# Patient Record
Sex: Male | Born: 1970
Health system: Southern US, Community
[De-identification: ages and names within clinical notes are randomized; demographics above are authoritative.]

## PROBLEM LIST (undated history)

## (undated) DIAGNOSIS — Z5189 Encounter for other specified aftercare: Secondary | ICD-10-CM

## (undated) DIAGNOSIS — E278 Other specified disorders of adrenal gland: Secondary | ICD-10-CM

## (undated) DIAGNOSIS — E279 Disorder of adrenal gland, unspecified: Secondary | ICD-10-CM

## (undated) DIAGNOSIS — I2510 Atherosclerotic heart disease of native coronary artery without angina pectoris: Secondary | ICD-10-CM

## (undated) DIAGNOSIS — G4730 Sleep apnea, unspecified: Secondary | ICD-10-CM

## (undated) DIAGNOSIS — J45909 Unspecified asthma, uncomplicated: Secondary | ICD-10-CM

## (undated) DIAGNOSIS — M109 Gout, unspecified: Secondary | ICD-10-CM

## (undated) DIAGNOSIS — M1990 Unspecified osteoarthritis, unspecified site: Secondary | ICD-10-CM

## (undated) DIAGNOSIS — K219 Gastro-esophageal reflux disease without esophagitis: Secondary | ICD-10-CM

## (undated) DIAGNOSIS — I10 Essential (primary) hypertension: Secondary | ICD-10-CM

## (undated) DIAGNOSIS — M25569 Pain in unspecified knee: Secondary | ICD-10-CM

## (undated) DIAGNOSIS — E785 Hyperlipidemia, unspecified: Secondary | ICD-10-CM

## (undated) DIAGNOSIS — G8929 Other chronic pain: Secondary | ICD-10-CM

## (undated) DIAGNOSIS — M25579 Pain in unspecified ankle and joints of unspecified foot: Secondary | ICD-10-CM

## (undated) DIAGNOSIS — M779 Enthesopathy, unspecified: Secondary | ICD-10-CM

## (undated) DIAGNOSIS — F1910 Other psychoactive substance abuse, uncomplicated: Secondary | ICD-10-CM

## (undated) HISTORY — DX: Unspecified asthma, uncomplicated: J45.909

## (undated) HISTORY — DX: Other psychoactive substance abuse, uncomplicated: F19.10

## (undated) HISTORY — PX: KNEE SURGERY: SHX244

## (undated) HISTORY — DX: Atherosclerotic heart disease of native coronary artery without angina pectoris: I25.10

## (undated) HISTORY — DX: Other chronic pain: G89.29

## (undated) HISTORY — DX: Hyperlipidemia, unspecified: E78.5

## (undated) HISTORY — DX: Gastro-esophageal reflux disease without esophagitis: K21.9

## (undated) HISTORY — DX: Disorder of adrenal gland, unspecified: E27.9

## (undated) HISTORY — DX: Gout, unspecified: M10.9

## (undated) HISTORY — PX: UPPER GASTROINTESTINAL ENDOSCOPY: SHX188

## (undated) HISTORY — DX: Other specified disorders of adrenal gland: E27.8

## (undated) HISTORY — DX: Encounter for other specified aftercare: Z51.89

## (undated) HISTORY — DX: Sleep apnea, unspecified: G47.30

---

## 2001-05-12 ENCOUNTER — Encounter: Payer: Self-pay | Admitting: Emergency Medicine

## 2001-05-12 ENCOUNTER — Emergency Department (HOSPITAL_COMMUNITY): Admission: EM | Admit: 2001-05-12 | Discharge: 2001-05-12 | Payer: Self-pay | Admitting: Emergency Medicine

## 2002-08-27 ENCOUNTER — Emergency Department (HOSPITAL_COMMUNITY): Admission: EM | Admit: 2002-08-27 | Discharge: 2002-08-27 | Payer: Self-pay | Admitting: Emergency Medicine

## 2002-10-01 ENCOUNTER — Emergency Department (HOSPITAL_COMMUNITY): Admission: EM | Admit: 2002-10-01 | Discharge: 2002-10-01 | Payer: Self-pay | Admitting: *Deleted

## 2002-10-01 ENCOUNTER — Encounter: Payer: Self-pay | Admitting: Emergency Medicine

## 2002-12-01 ENCOUNTER — Emergency Department (HOSPITAL_COMMUNITY): Admission: EM | Admit: 2002-12-01 | Discharge: 2002-12-02 | Payer: Self-pay | Admitting: Emergency Medicine

## 2009-05-21 ENCOUNTER — Emergency Department (HOSPITAL_COMMUNITY): Admission: EM | Admit: 2009-05-21 | Discharge: 2009-05-21 | Payer: Self-pay | Admitting: Emergency Medicine

## 2010-12-02 ENCOUNTER — Emergency Department (HOSPITAL_COMMUNITY)
Admission: EM | Admit: 2010-12-02 | Discharge: 2010-12-02 | Disposition: A | Discharge status: Home / Self Care | Payer: Self-pay | Attending: Emergency Medicine | Admitting: Emergency Medicine

## 2010-12-02 DIAGNOSIS — T622X1A Toxic effect of other ingested (parts of) plant(s), accidental (unintentional), initial encounter: Secondary | ICD-10-CM | POA: Insufficient documentation

## 2010-12-02 DIAGNOSIS — L298 Other pruritus: Secondary | ICD-10-CM | POA: Insufficient documentation

## 2010-12-02 DIAGNOSIS — R220 Localized swelling, mass and lump, head: Secondary | ICD-10-CM | POA: Insufficient documentation

## 2010-12-02 DIAGNOSIS — M7989 Other specified soft tissue disorders: Secondary | ICD-10-CM | POA: Insufficient documentation

## 2010-12-02 DIAGNOSIS — R221 Localized swelling, mass and lump, neck: Secondary | ICD-10-CM | POA: Insufficient documentation

## 2010-12-02 DIAGNOSIS — L2989 Other pruritus: Secondary | ICD-10-CM | POA: Insufficient documentation

## 2010-12-02 DIAGNOSIS — L255 Unspecified contact dermatitis due to plants, except food: Secondary | ICD-10-CM | POA: Insufficient documentation

## 2010-12-02 DIAGNOSIS — R21 Rash and other nonspecific skin eruption: Secondary | ICD-10-CM | POA: Insufficient documentation

## 2011-08-18 ENCOUNTER — Emergency Department (HOSPITAL_COMMUNITY)
Admission: EM | Admit: 2011-08-18 | Discharge: 2011-08-18 | Disposition: A | Discharge status: Home / Self Care | Payer: Self-pay | Attending: Emergency Medicine | Admitting: Emergency Medicine

## 2011-08-18 ENCOUNTER — Encounter (HOSPITAL_COMMUNITY): Payer: Self-pay | Admitting: *Deleted

## 2011-08-18 ENCOUNTER — Emergency Department (HOSPITAL_COMMUNITY): Payer: Self-pay

## 2011-08-18 DIAGNOSIS — M7150 Other bursitis, not elsewhere classified, unspecified site: Secondary | ICD-10-CM | POA: Insufficient documentation

## 2011-08-18 DIAGNOSIS — M25551 Pain in right hip: Secondary | ICD-10-CM

## 2011-08-18 DIAGNOSIS — IMO0002 Reserved for concepts with insufficient information to code with codable children: Secondary | ICD-10-CM | POA: Insufficient documentation

## 2011-08-18 DIAGNOSIS — I10 Essential (primary) hypertension: Secondary | ICD-10-CM | POA: Insufficient documentation

## 2011-08-18 DIAGNOSIS — M25559 Pain in unspecified hip: Secondary | ICD-10-CM | POA: Insufficient documentation

## 2011-08-18 DIAGNOSIS — M719 Bursopathy, unspecified: Secondary | ICD-10-CM

## 2011-08-18 HISTORY — DX: Essential (primary) hypertension: I10

## 2011-08-18 MED ORDER — IBUPROFEN 600 MG PO TABS: 600.0000 mg | ORAL_TABLET | Freq: Four times a day (QID) | ORAL | Status: AC | PRN

## 2011-08-18 MED ORDER — OXYCODONE-ACETAMINOPHEN 5-325 MG PO TABS: 2.0000 | ORAL_TABLET | ORAL | Status: AC | PRN

## 2011-08-18 MED ORDER — OXYCODONE-ACETAMINOPHEN 5-325 MG PO TABS
2.0000 | ORAL_TABLET | Freq: Once | ORAL | Status: AC
  Administered 2011-08-18: 2 via ORAL
  Filled 2011-08-18: qty 2

## 2011-08-18 NOTE — ED Provider Notes (Signed)
History     CSN: [id number]  Arrival date & time 08/18/11  0745   First MD Initiated Contact with Patient 08/18/11 [phone]      Chief Complaint  Patient presents with  . Hip Pain    (Consider location/radiation/quality/duration/timing/severity/associated sxs/prior treatment) Patient is a 41 y.o. male presenting with hip pain. The history is provided by the patient. No language interpreter was used.  Hip Pain This is a new problem. The current episode started in the past 7 days. The problem occurs constantly. The problem has been gradually worsening. Associated symptoms include arthralgias and weakness. Pertinent negatives include no abdominal pain, fever, joint swelling, nausea, rash or vomiting. The symptoms are aggravated by walking, twisting and bending. He has tried NSAIDs and oral narcotics for the symptoms. The treatment provided mild relief.   Patient reports progressively worsening R hip pain x 3 days.  States that the pain is worse with abduction and palpation to the R lateral hip.  Unable to sleep last pm due to the pain.  Has been taking ibuprofen/ aleve and one percocet. States that the percocet did help the pain some.  Denies trauma, fever. Erythema to the hip.  Has seen Dr. Eulah Pont in the past for L knee surgery.  pmh hypertension.  No prior hip injury.  Has been lifting weights x 6 months. Having difficulty driving as well.  Pain 10/10.   Past Medical History  Diagnosis Date  . Hypertension     No past surgical history on file.  No family history on file.  History  Substance Use Topics  . Smoking status: Never Smoker   . Smokeless tobacco: Not on file  . Alcohol Use: Yes      Review of Systems  Constitutional: Negative for fever.  HENT: Negative.   Eyes: Negative.   Respiratory: Negative.   Cardiovascular: Negative.   Gastrointestinal: Negative.  Negative for nausea, vomiting and abdominal pain.  Musculoskeletal: Positive for arthralgias and gait problem.  Negative for back pain and joint swelling.       R lateral hip pain  Skin: Negative for rash.  Neurological: Positive for weakness.  Psychiatric/Behavioral: Negative.   All other systems reviewed and are negative.    Allergies  Review of patient's allergies indicates no known allergies.  Home Medications   Current Outpatient Rx  Name Route Sig Dispense Refill  . IBUPROFEN 200 MG PO TABS Oral Take 800 mg by mouth every 6 (six) hours as needed. Pain      BP 128/79  Pulse 70  Temp 98.3 F (36.8 C) (Oral)  SpO2 99%  Physical Exam  Nursing note and vitals reviewed. Constitutional: He is oriented to person, place, and time. He appears well-developed and well-nourished.  HENT:  Head: Normocephalic.  Eyes: Conjunctivae and EOM are normal. Pupils are equal, round, and reactive to light.  Neck: Normal range of motion. Neck supple.  Cardiovascular: Normal rate.   Pulmonary/Chest: Effort normal.  Abdominal: Soft.  Musculoskeletal: Normal range of motion.       R hip tenderness with palpatation and abduction.  Unable to lift leg off bed.  Neurological: He is alert and oriented to person, place, and time.  Skin: Skin is warm and dry.  Psychiatric: He has a normal mood and affect.    ED Course  Procedures (including critical care time)  Labs Reviewed - No data to display No results found.   No diagnosis found.    MDM  R hip pain with bursitis  and DDD.  Will treat with ice rest, ibuprofen and percocet.  Follow up with Dr. Eulah Pont your orthopedist or the one on call for ER.  Return if hip becomes hot to touch or fever.   Crutches and work note.          Remi Haggard, NP 08/18/11 [phone]

## 2011-08-18 NOTE — ED Notes (Signed)
Rt. Hip pain (sharp); Fri-took ibuprofen, aleve, and a neighbors percocet - percocet helped for 6 hrs. The inc. Pain with movement. Difficult to drive to work.

## 2011-08-18 NOTE — ED Provider Notes (Signed)
Medical screening examination/treatment/procedure(s) were performed by non-physician practitioner and as supervising physician I was immediately available for consultation/collaboration.   Gene Reed. Oletta Lamas, MD 08/18/11 [phone]

## 2011-08-18 NOTE — Progress Notes (Signed)
Orthopedic Tech Progress Note Patient Details:  Gene Reed Sep 24, 1970 [id number]  Ortho Devices Type of Ortho Device: Crutches Ortho Device/Splint Interventions: Adjustment   Cammer, Mickie Bail 08/18/2011, 9:30 AM

## 2012-09-09 ENCOUNTER — Emergency Department (HOSPITAL_COMMUNITY): Payer: Self-pay

## 2012-09-09 ENCOUNTER — Encounter (HOSPITAL_COMMUNITY): Payer: Self-pay | Admitting: Emergency Medicine

## 2012-09-09 ENCOUNTER — Emergency Department (HOSPITAL_COMMUNITY)
Admission: EM | Admit: 2012-09-09 | Discharge: 2012-09-09 | Disposition: A | Discharge status: Home / Self Care | Payer: Self-pay | Attending: Emergency Medicine | Admitting: Emergency Medicine

## 2012-09-09 DIAGNOSIS — M25562 Pain in left knee: Secondary | ICD-10-CM

## 2012-09-09 DIAGNOSIS — M11262 Other chondrocalcinosis, left knee: Secondary | ICD-10-CM

## 2012-09-09 DIAGNOSIS — I10 Essential (primary) hypertension: Secondary | ICD-10-CM | POA: Insufficient documentation

## 2012-09-09 DIAGNOSIS — M1120 Other chondrocalcinosis, unspecified site: Secondary | ICD-10-CM | POA: Insufficient documentation

## 2012-09-09 DIAGNOSIS — M25569 Pain in unspecified knee: Secondary | ICD-10-CM | POA: Insufficient documentation

## 2012-09-09 DIAGNOSIS — Z791 Long term (current) use of non-steroidal anti-inflammatories (NSAID): Secondary | ICD-10-CM | POA: Insufficient documentation

## 2012-09-09 MED ORDER — IBUPROFEN 800 MG PO TABS: 800.0000 mg | ORAL_TABLET | Freq: Three times a day (TID) | ORAL | Status: DC

## 2012-09-09 NOTE — ED Provider Notes (Signed)
CSN: [id number]     Arrival date & time 09/09/12  1820 History    This chart was scribed for Gene Finner, PA working with Flint Melter, MD by Quintella Reichert, ED Scribe. This patient was seen in room WTR8/WTR8 and the patient's care was started at 9:28 PM.     Chief Complaint  Patient presents with  . Knee Pain    The history is provided by the patient. No language interpreter was used.    HPI Comments: Gene Reed is a 42 y.o. male who presents to the Emergency Department complaining of gradual-onset, gradually-worsening severe left knee pain with accompanying intermittent swelling and "popping."  Pain is localized to the anterior knee and is described as a stabbing aching pain that is exacerbated by walking.  He also states it sometimes "feels like it separates."  He denies pain to the back of the knee.  Pt had surgery to the knee for ligament tears when he was 42 years old.  He notes that since then he has had some mild aches to the knee in the winter but he did not have any other problems with the knee since that time.   He has attempted to treat pain with Goody's tablets, with some relief. .He notes that he lifts heavy objects 8 hours/day at work.    Pt has no PCP.    Past Medical History  Diagnosis Date  . Hypertension     History reviewed. No pertinent past surgical history.   No family history on file.   History  Substance Use Topics  . Smoking status: Never Smoker   . Smokeless tobacco: Never Used  . Alcohol Use: Yes     Review of Systems  Musculoskeletal: Positive for arthralgias.  All other systems reviewed and are negative.      Allergies  Review of patient's allergies indicates no known allergies.  Home Medications   Current Outpatient Rx  Name  Route  Sig  Dispense  Refill  . ibuprofen (ADVIL,MOTRIN) 800 MG tablet   Oral   Take 1 tablet (800 mg total) by mouth 3 (three) times daily.   21 tablet   0     BP 128/82  Pulse 91  Temp(Src)  98.8 F (37.1 C) (Oral)  Resp 20  SpO2 98%  Physical Exam  Nursing note and vitals reviewed. Constitutional: He is oriented to person, place, and time. He appears well-developed and well-nourished.  HENT:  Head: Normocephalic and atraumatic.  Eyes: EOM are normal.  Neck: Normal range of motion.  Cardiovascular: Normal rate.   Pulmonary/Chest: Effort normal.  Musculoskeletal: Normal range of motion.       Left knee: He exhibits normal range of motion, no swelling, no ecchymosis and no erythema. Tenderness found.  2 well-healed surgical scars on left knee.  No edema, erythema or ecchymosis. Full ROM. Tenderness in medial inferior aspect and lateral aspect of left knee. Neurovascularly intact distal from left knee.  Neurological: He is alert and oriented to person, place, and time.  Skin: Skin is warm and dry.  Psychiatric: He has a normal mood and affect. His behavior is normal.    ED Course  Procedures (including critical care time)  DIAGNOSTIC STUDIES: Oxygen Saturation is 98% on room air, normal by my interpretation.    COORDINATION OF CARE: 9:35 PM- Informed pt that imaging revealed multiple bony abnormalities.  Discussed treatment plan which includes knee brace, pain medication and f/u with orthopedics with pt at bedside  and pt agreed to plan.     Labs Reviewed - No data to display   Dg Knee Complete 4 Views Left  09/09/2012   *RADIOLOGY REPORT*  Clinical Data: Knee pain  LEFT KNEE - COMPLETE 4+ VIEW  Comparison: None.  Findings: Four views of the left knee submitted.  Postsurgical changes are noted post ACL repair.  There is spurring of the proximal fibula.  Chondrocalcinosis. Small joint effusion. Significant narrowing of patellofemoral joint space.  Spurring of patella. No acute fracture or subluxation.  IMPRESSION: Postsurgical changes are noted post ACL repair.  There is spurring of the proximal fibula.  Chondrocalcinosis. Small joint effusion. Significant narrowing of  patellofemoral joint space.  Spurring of patella. No acute fracture or subluxation.   Original Report Authenticated By: Natasha Mead, M.D.    1. Left knee pain   2. Chondrocalcinosis of left knee      MDM  Pt experiencing knee pain from old knee injury.  Plain films show: spurring of proximxl fibula and chondrocalcinosis with small joint effusion.  Pt will need to f/u with orthopedics for further evaluation and tx for his knee pain.    Rx: knee sleeve, ibuprofen, f/u with [facility].   I personally performed the services described in this documentation, which was scribed in my presence. The recorded information has been reviewed and is accurate.    Gene Finner, PA-C 09/10/12 2035

## 2012-09-09 NOTE — ED Notes (Signed)
Pt reports c/o 8/10 left sided knee pain. Pt states that he had ligament surgery on that knee when he was 17 and hasn't had any difficulty since. Pt denies any recent trauma or injury. Pt states he has used a leg brace over the past few days, which has helped, however wanted to get the knee examined. Pt A/O x4, vital signs are stable, NAD.

## 2012-09-11 NOTE — ED Provider Notes (Signed)
Medical screening examination/treatment/procedure(s) were performed by non-physician practitioner and as supervising physician I was immediately available for consultation/collaboration.  Flint Melter, MD 09/11/12 [phone]

## 2012-10-15 ENCOUNTER — Emergency Department (HOSPITAL_COMMUNITY): Payer: Self-pay

## 2012-10-15 ENCOUNTER — Emergency Department (HOSPITAL_COMMUNITY)
Admission: EM | Admit: 2012-10-15 | Discharge: 2012-10-15 | Disposition: A | Discharge status: Home / Self Care | Payer: Self-pay | Attending: Emergency Medicine | Admitting: Emergency Medicine

## 2012-10-15 ENCOUNTER — Encounter (HOSPITAL_COMMUNITY): Payer: Self-pay | Admitting: Emergency Medicine

## 2012-10-15 DIAGNOSIS — S8990XA Unspecified injury of unspecified lower leg, initial encounter: Secondary | ICD-10-CM | POA: Insufficient documentation

## 2012-10-15 DIAGNOSIS — Y9302 Activity, running: Secondary | ICD-10-CM | POA: Insufficient documentation

## 2012-10-15 DIAGNOSIS — X500XXA Overexertion from strenuous movement or load, initial encounter: Secondary | ICD-10-CM | POA: Insufficient documentation

## 2012-10-15 DIAGNOSIS — I10 Essential (primary) hypertension: Secondary | ICD-10-CM | POA: Insufficient documentation

## 2012-10-15 DIAGNOSIS — Y929 Unspecified place or not applicable: Secondary | ICD-10-CM | POA: Insufficient documentation

## 2012-10-15 DIAGNOSIS — Z87891 Personal history of nicotine dependence: Secondary | ICD-10-CM | POA: Insufficient documentation

## 2012-10-15 DIAGNOSIS — S99921A Unspecified injury of right foot, initial encounter: Secondary | ICD-10-CM

## 2012-10-15 HISTORY — DX: Pain in unspecified knee: M25.569

## 2012-10-15 NOTE — ED Provider Notes (Signed)
This chart was scribed for Gene Argyle PA-C, a non-physician practitioner working with Gene Anger, DO by Gene Reed, ED Scribe. This patient was seen in room WTR6/WTR6 and the patient's care was started at 1816.   CSN: [id number]     Arrival date & time 10/15/12  1614 History   First MD Initiated Contact with Patient 10/15/12 1702     Chief Complaint  Patient presents with  . Foot Pain    "felt popping sensation" in heel last night    The history is provided by the patient.   HPI Comments: Gene Reed is a 42 y.o. male with a PMH of HTN and knee pain who presents to the Emergency Department complaining of foot pain.  His pain is constant located in the arch of the right foot. Pain occurred last night after running.  Patient states he heard a popping sensation at the onset of pain.  Describes pain as sharp. Reports associated mild swelling. Reports he works at [employer] and is always on his feet. Denies associated numbness, loss of sensation/weakness.  No associated injuries. Reports trying 6 BC powders yesterday and warm soaks with Epson salts with no relief of symptoms.  Also has applied compression with no relief.     Past Medical History  Diagnosis Date  . Hypertension   . Knee pain    Past Surgical History  Procedure Laterality Date  . Knee surgery     History reviewed. No pertinent family history. History  Substance Use Topics  . Smoking status: Former Games developer  . Smokeless tobacco: Never Used  . Alcohol Use: Yes    Review of Systems  Musculoskeletal:       Right heel pain    A complete 10 system review of systems was obtained and all systems are negative except as noted in the HPI and PMH.    Allergies  Review of patient's allergies indicates no known allergies.  Home Medications   Current Outpatient Rx  Name  Route  Sig  Dispense  Refill  . Aspirin-Acetaminophen-Caffeine (GOODY HEADACHE PO)   Oral   Take 1 packet by mouth every 4 (four)  hours as needed (pain).          BP 144/94  Pulse 97  Temp(Src) 98.2 F (36.8 C) (Oral)  Resp 18  Wt 242 lb (109.77 kg)  SpO2 99%  Filed Vitals:   10/15/12 1648  BP: 144/94  Pulse: 97  Temp: 98.2 F (36.8 C)  TempSrc: Oral  Resp: 18  Weight: 242 lb (109.77 kg)  SpO2: 99%    Physical Exam  Nursing note and vitals reviewed. Constitutional: He is oriented to person, place, and time. He appears well-developed and well-nourished. No distress.  HENT:  Head: Normocephalic and atraumatic.  Eyes: Conjunctivae and EOM are normal.  Neck: Neck supple. No tracheal deviation present.  Cardiovascular: Normal rate, regular rhythm and normal heart sounds.  Exam reveals no gallop and no friction rub.   No murmur heard. Pulmonary/Chest: Effort normal and breath sounds normal. No respiratory distress. He has no wheezes. He has no rales.  Abdominal: Soft. He exhibits no distension. There is no tenderness.  Musculoskeletal: Normal range of motion. He exhibits edema and tenderness.  Medial arch swelling of right foot.  Tenderness to palpation to the arch of the right foot.    Neurological: He is alert and oriented to person, place, and time.  Skin: Skin is warm and dry.  No ecchymosis, erythema, or  lacerations to right foot.   Psychiatric: He has a normal mood and affect. His behavior is normal.    ED Course  Procedures (including critical care time) Medications - No data to display  Labs Review Labs Reviewed - No data to display Imaging Review Dg Foot Complete Right  10/15/2012   *RADIOLOGY REPORT*  Clinical Data: Right foot pain following injury.  RIGHT FOOT COMPLETE - 3+ VIEW  Comparison: None.  Findings: No acute fracture or dislocation is noted.  No gross soft tissue abnormality is seen.  IMPRESSION: No acute abnormality noted.   Original Report Authenticated By: Alcide Clever, M.D.     DG Foot Complete Right (Final result)  Result time: 10/15/12 17:49:19    Final result by Rad  Results In Interface (10/15/12 17:49:19)    Narrative:   *RADIOLOGY REPORT*  Clinical Data: Right foot pain following injury.  RIGHT FOOT COMPLETE - 3+ VIEW  Comparison: None.  Findings: No acute fracture or dislocation is noted. No gross soft tissue abnormality is seen.  IMPRESSION: No acute abnormality noted.   Original Report Authenticated By: Alcide Clever, M.D.    MDM   1. Right foot injury, initial encounter     Gene Reed is a 42 y.o. male with a PMH of HTN and knee pain who presents to the Emergency Department complaining of foot pain.  X-rays of right foot ordered to further evaluate.       Etiology of right foot pain unclear. Soft tissue injuries cannot be excluded. No fx or dislocation on x-rays. ACE wrap applied.  Crutches given and patient instructed to be non-weight bearing. Patient given orthopedic follow-up and instructed to make an appt as soon as possible.  Patient encouraged to ice, elevate, and rest.  Patient was instructed to return to the ED if they experience any redness, fever, cyanosis, or other concerns.  Patient was in agreement with discharge and plan.     Final impressions: 1. Right foot injury     [name]    I personally performed the services described in this documentation, which was scribed in my presence. The recorded information has been reviewed and is accurate.    Jillyn Ledger, PA-C 10/16/12 1658

## 2012-10-15 NOTE — ED Notes (Signed)
Pt was running last night and felt "popping sensation" in r/heel

## 2012-10-17 NOTE — ED Provider Notes (Signed)
Medical screening examination/treatment/procedure(s) were performed by non-physician practitioner and as supervising physician I was immediately available for consultation/collaboration.   Laray Anger, DO 10/17/12 1457

## 2013-09-23 ENCOUNTER — Encounter (HOSPITAL_COMMUNITY): Payer: Self-pay | Admitting: Emergency Medicine

## 2013-09-23 ENCOUNTER — Emergency Department (HOSPITAL_COMMUNITY): Payer: Self-pay

## 2013-09-23 ENCOUNTER — Emergency Department (HOSPITAL_COMMUNITY)
Admission: EM | Admit: 2013-09-23 | Discharge: 2013-09-23 | Disposition: A | Discharge status: Home / Self Care | Payer: Self-pay | Attending: Emergency Medicine | Admitting: Emergency Medicine

## 2013-09-23 DIAGNOSIS — M19071 Primary osteoarthritis, right ankle and foot: Secondary | ICD-10-CM

## 2013-09-23 DIAGNOSIS — M19079 Primary osteoarthritis, unspecified ankle and foot: Secondary | ICD-10-CM | POA: Insufficient documentation

## 2013-09-23 DIAGNOSIS — M25579 Pain in unspecified ankle and joints of unspecified foot: Secondary | ICD-10-CM | POA: Insufficient documentation

## 2013-09-23 DIAGNOSIS — I10 Essential (primary) hypertension: Secondary | ICD-10-CM | POA: Insufficient documentation

## 2013-09-23 DIAGNOSIS — Z87891 Personal history of nicotine dependence: Secondary | ICD-10-CM | POA: Insufficient documentation

## 2013-09-23 DIAGNOSIS — G8929 Other chronic pain: Secondary | ICD-10-CM | POA: Insufficient documentation

## 2013-09-23 DIAGNOSIS — Z87828 Personal history of other (healed) physical injury and trauma: Secondary | ICD-10-CM | POA: Insufficient documentation

## 2013-09-23 DIAGNOSIS — M25571 Pain in right ankle and joints of right foot: Secondary | ICD-10-CM

## 2013-09-23 MED ORDER — OXYCODONE-ACETAMINOPHEN 5-325 MG PO TABS: 1.0000 | ORAL_TABLET | Freq: Four times a day (QID) | ORAL | Status: DC | PRN

## 2013-09-23 MED ORDER — NAPROXEN 500 MG PO TABS: 500.0000 mg | ORAL_TABLET | Freq: Two times a day (BID) | ORAL | Status: DC | PRN

## 2013-09-23 NOTE — ED Notes (Signed)
Pt presents with c/o right ankle pain. Pt reports that the pain started three weeks and he denies any injury that he knows of. Pt ambulatory to triage.

## 2013-09-23 NOTE — Discharge Instructions (Signed)
Wear ankle brace for at least 2 weeks for stabilization of ankle. Use crutches as needed for comfort. Use heat and elevate ankle throughout the day. Alternate between naprosyn and percocet for pain relief. Do not drive or operate machinery with pain medication use. Call orthopedic follow up today or tomorrow to schedule followup appointment for recheck of ongoing ankle pain and discuss further management of your degenerative ankle condition. Return to the ER for any changes or worsening symptoms.   Ankle Pain Ankle pain is a common symptom. The bones, cartilage, tendons, and muscles of the ankle joint perform a lot of work each day. The ankle joint holds your body weight and allows you to move around. Ankle pain can occur on either side or back of 1 or both ankles. Ankle pain may be sharp and burning or dull and aching. There may be tenderness, stiffness, redness, or warmth around the ankle. The pain occurs more often when a person walks or puts pressure on the ankle. CAUSES  There are many reasons ankle pain can develop. It is important to work with your caregiver to identify the cause since many conditions can impact the bones, cartilage, muscles, and tendons. Causes for ankle pain include:  Injury, including a break (fracture), sprain, or strain often due to a fall, sports, or a high-impact activity.  Swelling (inflammation) of a tendon (tendonitis).  Achilles tendon rupture.  Ankle instability after repeated sprains and strains.  Poor foot alignment.  Pressure on a nerve (tarsal tunnel syndrome).  Arthritis in the ankle or the lining of the ankle.  Crystal formation in the ankle (gout or pseudogout). DIAGNOSIS  A diagnosis is based on your medical history, your symptoms, results of your physical exam, and results of diagnostic tests. Diagnostic tests may include X-ray exams or a computerized magnetic scan (magnetic resonance imaging, MRI). TREATMENT  Treatment will depend on the cause  of your ankle pain and may include:  Keeping pressure off the ankle and limiting activities.  Using crutches or other walking support (a cane or brace).  Using rest, ice, compression, and elevation.  Participating in physical therapy or home exercises.  Wearing shoe inserts or special shoes.  Losing weight.  Taking medications to reduce pain or swelling or receiving an injection.  Undergoing surgery. HOME CARE INSTRUCTIONS   Only take over-the-counter or prescription medicines for pain, discomfort, or fever as directed by your caregiver.  Put ice on the injured area.  Put ice in a plastic bag.  Place a towel between your skin and the bag.  Leave the ice on for 15-20 minutes at a time, 03-04 times a day.  Keep your leg raised (elevated) when possible to lessen swelling.  Avoid activities that cause ankle pain.  Follow specific exercises as directed by your caregiver.  Record how often you have ankle pain, the location of the pain, and what it feels like. This information may be helpful to you and your caregiver.  Ask your caregiver about returning to work or sports and whether you should drive.  Follow up with your caregiver for further examination, therapy, or testing as directed. SEEK MEDICAL CARE IF:   Pain or swelling continues or worsens beyond 1 week.  You have an oral temperature above 102 F (38.9 C).  You are feeling unwell or have chills.  You are having an increasingly difficult time with walking.  You have loss of sensation or other new symptoms.  You have questions or concerns. MAKE SURE YOU:  Understand these instructions.  Will watch your condition.  Will get help right away if you are not doing well or get worse. Document Released: 07/11/2009 Document Revised: 04/15/2011 Document Reviewed: 07/11/2009 [facility] Patient Information 2015 [employer], [place]. This information is not intended to replace advice given to you by your health care  provider. Make sure you discuss any questions you have with your health care provider.  Arthritis, Nonspecific Arthritis is inflammation of a joint. This usually means pain, redness, warmth or swelling are present. One or more joints may be involved. There are a number of types of arthritis. Your caregiver may not be able to tell what type of arthritis you have right away. CAUSES  The most common cause of arthritis is the wear and tear on the joint (osteoarthritis). This causes damage to the cartilage, which can break down over time. The knees, hips, back and neck are most often affected by this type of arthritis. Other types of arthritis and common causes of joint pain include:  Sprains and other injuries near the joint. Sometimes minor sprains and injuries cause pain and swelling that develop hours later.  Rheumatoid arthritis. This affects hands, feet and knees. It usually affects both sides of your body at the same time. It is often associated with chronic ailments, fever, weight loss and general weakness.  Crystal arthritis. Gout and pseudo gout can cause occasional acute severe pain, redness and swelling in the foot, ankle, or knee.  Infectious arthritis. Bacteria can get into a joint through a break in overlying skin. This can cause infection of the joint. Bacteria and viruses can also spread through the blood and affect your joints.  Drug, infectious and allergy reactions. Sometimes joints can become mildly painful and slightly swollen with these types of illnesses. SYMPTOMS   Pain is the main symptom.  Your joint or joints can also be red, swollen and warm or hot to the touch.  You may have a fever with certain types of arthritis, or even feel overall ill.  The joint with arthritis will hurt with movement. Stiffness is present with some types of arthritis. DIAGNOSIS  Your caregiver will suspect arthritis based on your description of your symptoms and on your exam. Testing may be  needed to find the type of arthritis:  Blood and sometimes urine tests.  X-ray tests and sometimes CT or MRI scans.  Removal of fluid from the joint (arthrocentesis) is done to check for bacteria, crystals or other causes. Your caregiver (or a specialist) will numb the area over the joint with a local anesthetic, and use a needle to remove joint fluid for examination. This procedure is only minimally uncomfortable.  Even with these tests, your caregiver may not be able to tell what kind of arthritis you have. Consultation with a specialist (rheumatologist) may be helpful. TREATMENT  Your caregiver will discuss with you treatment specific to your type of arthritis. If the specific type cannot be determined, then the following general recommendations may apply. Treatment of severe joint pain includes:  Rest.  Elevation.  Anti-inflammatory medication (for example, ibuprofen) may be prescribed. Avoiding activities that cause increased pain.  Only take over-the-counter or prescription medicines for pain and discomfort as recommended by your caregiver.  Cold packs over an inflamed joint may be used for 10 to 15 minutes every hour. Hot packs sometimes feel better, but do not use overnight. Do not use hot packs if you are diabetic without your caregiver's permission.  A cortisone shot into arthritic  joints may help reduce pain and swelling.  Any acute arthritis that gets worse over the next 1 to 2 days needs to be looked at to be sure there is no joint infection. Long-term arthritis treatment involves modifying activities and lifestyle to reduce joint stress jarring. This can include weight loss. Also, exercise is needed to nourish the joint cartilage and remove waste. This helps keep the muscles around the joint strong. HOME CARE INSTRUCTIONS   Do not take aspirin to relieve pain if gout is suspected. This elevates uric acid levels.  Only take over-the-counter or prescription medicines for  pain, discomfort or fever as directed by your caregiver.  Rest the joint as much as possible.  If your joint is swollen, keep it elevated.  Use crutches if the painful joint is in your leg.  Drinking plenty of fluids may help for certain types of arthritis.  Follow your caregiver's dietary instructions.  Try low-impact exercise such as:  Swimming.  Water aerobics.  Biking.  Walking.  Morning stiffness is often relieved by a warm shower.  Put your joints through regular range-of-motion. SEEK MEDICAL CARE IF:   You do not feel better in 24 hours or are getting worse.  You have side effects to medications, or are not getting better with treatment. SEEK IMMEDIATE MEDICAL CARE IF:   You have a fever.  You develop severe joint pain, swelling or redness.  Many joints are involved and become painful and swollen.  There is severe back pain and/or leg weakness.  You have loss of bowel or bladder control. Document Released: 02/29/2004 Document Revised: 04/15/2011 Document Reviewed: 03/16/2008 [facility] Patient Information 2015 [place], [place]. This information is not intended to replace advice given to you by your health care provider. Make sure you discuss any questions you have with your health care provider.  Heat Therapy Heat therapy can help ease sore, stiff, injured, and tight muscles and joints. Heat relaxes your muscles, which may help ease your pain.  RISKS AND COMPLICATIONS If you have any of the following conditions, do not use heat therapy unless your health care provider has approved:  Poor circulation.  Healing wounds or scarred skin in the area being treated.  Diabetes, heart disease, or high blood pressure.  Not being able to feel (numbness) the area being treated.  Unusual swelling of the area being treated.  Active infections.  Blood clots.  Cancer.  Inability to communicate pain. This may include young children and people who have problems with  their brain function (dementia).  Pregnancy. Heat therapy should only be used on old, pre-existing, or long-lasting (chronic) injuries. Do not use heat therapy on new injuries unless directed by your health care provider. HOW TO USE HEAT THERAPY There are several different kinds of heat therapy, including:  Moist heat pack.  Warm water bath.  Hot water bottle.  Electric heating pad.  Heated gel pack.  Heated wrap.  Electric heating pad. Use the heat therapy method suggested by your health care provider. Follow your health care provider's instructions on when and how to use heat therapy. GENERAL HEAT THERAPY RECOMMENDATIONS  Do not sleep while using heat therapy. Only use heat therapy while you are awake.  Your skin may turn pink while using heat therapy. Do not use heat therapy if your skin turns red.  Do not use heat therapy if you have new pain.  High heat or long exposure to heat can cause burns. Be careful when using heat therapy to avoid  burning your skin.  Do not use heat therapy on areas of your skin that are already irritated, such as with a rash or sunburn. SEEK MEDICAL CARE IF:  You have blisters, redness, swelling, or numbness.  You have new pain.  Your pain is worse. MAKE SURE YOU:  Understand these instructions.  Will watch your condition.  Will get help right away if you are not doing well or get worse. Document Released: 04/15/2011 Document Revised: 06/07/2013 Document Reviewed: 03/16/2013 [facility] Patient Information 2015 [place], [place]. This information is not intended to replace advice given to you by your health care provider. Make sure you discuss any questions you have with your health care provider.

## 2013-09-23 NOTE — ED Provider Notes (Signed)
Medical screening examination/treatment/procedure(s) were performed by non-physician practitioner and as supervising physician I was immediately available for consultation/collaboration.   Toy BakerAnthony T Richardine Peppers, MD 09/23/13 [phone]

## 2013-09-23 NOTE — ED Provider Notes (Signed)
CSN: [id number]     Arrival date & time 09/23/13  [id number] History   First MD Initiated Contact with Patient 09/23/13 2005     Chief Complaint  Patient presents with  . Ankle Pain     (Consider location/radiation/quality/duration/timing/severity/associated sxs/prior Treatment) HPI Comments: Gene Reed is a 43 y.o. 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 States the pain is 9/10, constant, sharp, located over medial aspect of ankle, nonradiating, worse with walking especially on uneven surfaces, and with no known alleviating factors. States he sprained this ankle last September, no known injury prior to this pain reoccurring. States the pain has mildly improved over the last 3 weeks but has not gone fully away. States he's ambulatory but he has to "turn the foot out" to walk. Associated symptom is mild swelling, stiffness, and some loss of ROM with dorsiflexion and inversion. Denies fevers, chills, hx of gout, erythema or warmth, conjunctivitis, CP, SOB, cough, dysuria, penile discharge, paresthesias or weakness.   Patient is a 43 y.o. male presenting with ankle pain. The history is provided by the patient. No language interpreter was used.  Ankle Pain Location:  Ankle Time since incident:  3 weeks Injury: no   Ankle location:  R ankle Pain details:    Quality:  Sharp   Radiates to:  Does not radiate   Severity:  Moderate   Onset quality:  Gradual   Duration:  3 weeks   Timing:  Constant   Progression:  Partially resolved Chronicity:  Recurrent Dislocation: no   Prior injury to area:  Yes (sprained in Sept 2014) Relieved by:  None tried Worsened by:  Activity Ineffective treatments:  None tried Associated symptoms: decreased ROM, stiffness and swelling   Associated symptoms: no back pain, no fever, no muscle weakness, no neck pain, no numbness and no tingling     Past Medical History   Diagnosis Date  . Hypertension   . Knee pain    Past Surgical History  Procedure Laterality Date  . Knee surgery     No family history on file. History  Substance Use Topics  . Smoking status: Former Games developermoker  . Smokeless tobacco: Never Used  . Alcohol Use: Yes     Comment: daily     Review of Systems  Constitutional: Negative for fever and chills.  Eyes: Negative for pain and redness.  Respiratory: Negative for shortness of breath.   Cardiovascular: Negative for chest pain and leg swelling.  Gastrointestinal: Negative for nausea, vomiting and abdominal pain.  Musculoskeletal: Positive for arthralgias (R ankle), joint swelling (minimal R ankle) and stiffness. Negative for back pain, gait problem and neck pain.  Skin: Negative for color change and rash.  Neurological: Negative for dizziness, weakness and numbness.  Hematological: Does not bruise/bleed easily.  10 Systems reviewed and are negative for acute change except as noted in the HPI.     Allergies  Review of patient's allergies indicates no known allergies.  Home Medications   Prior to Admission medications   Medication Sig Start Date End Date Taking? Authorizing Provider  ibuprofen (ADVIL,MOTRIN) 200 MG tablet Take 600 mg by mouth every 6 (six) hours as needed for moderate pain.   Yes Historical Provider, MD  naproxen (NAPROSYN) 500 MG tablet Take 1 tablet (500 mg total) by mouth 2 (two) times daily as needed for mild pain, moderate pain  or headache (TAKE WITH MEALS.). 09/23/13   Zoelle Markus Strupp Camprubi-Soms, PA-C  oxyCODONE-acetaminophen (PERCOCET) 5-325 MG per tablet Take 1-2 tablets by mouth every 6 (six) hours as needed for severe pain. 09/23/13   Daltin Crist Strupp Camprubi-Soms, PA-C   BP 140/89  Pulse 113  Temp(Src) 98.9 F (37.2 C) (Oral)  Resp 24  SpO2 96% Physical Exam  Nursing note and vitals reviewed. Constitutional: He is oriented to person, place, and time. Vital signs are normal. He appears  well-developed and well-nourished. No distress.  HENT:  Head: Normocephalic and atraumatic.  Mouth/Throat: Mucous membranes are normal.  Eyes: Conjunctivae and EOM are normal. Right eye exhibits no discharge. Left eye exhibits no discharge.  Neck: Normal range of motion. Neck supple.  Cardiovascular: Normal rate and intact distal pulses.   Distal pulses intact in all extremities, cap refill brisk in all digits  Pulmonary/Chest: Effort normal. No respiratory distress.  Abdominal: Normal appearance. He exhibits no distension.  Musculoskeletal:       Right ankle: He exhibits decreased range of motion (2/2 pain) and swelling. He exhibits no ecchymosis, no deformity and normal pulse. Tenderness (medial ligaments and medial malleolus). Medial malleolus tenderness found. Achilles tendon normal.  R ankle with dec ROM secondary to pain especially with inversion, and dorsiflexion/plantarflexion. Strength with these movements decreased secondary to pain. TTP over medial ligaments and medial malleolus, with mild swelling of soft tissues but no joint effusion palpated. No ecchymosis, deformity, or achilles tendon injury/pain. No erythema or warmth to the area. Neg subtalar tilt test, neg anterior and posterior drawer test  Neurological: He is alert and oriented to person, place, and time. No sensory deficit.  Sensation grossly intact in all extremities  Skin: Skin is warm, dry and intact. No rash noted. No erythema.  Psychiatric: He has a normal mood and affect.    ED Course  Procedures (including critical care time) Labs Review Labs Reviewed - No data to display  Imaging Review Dg Ankle Complete Right  09/23/2013   CLINICAL DATA:  Injured right ankle.  EXAM: RIGHT ANKLE - COMPLETE 3+ VIEW  COMPARISON:  None.  FINDINGS: The ankle mortise is maintained. No acute ankle fracture. Mild degenerative changes, likely remote posttraumatic change. No definite joint effusion. No osteochondral abnormality. The  visualized mid and hindfoot bony structures are intact.  IMPRESSION: No acute bony findings.  Degenerative changes, likely posttraumatic.   Electronically Signed   By: Loralie Champagne M.D.   On: 09/23/2013 20:33     EKG Interpretation None      MDM   Final diagnoses:  Chronic ankle pain, right  Osteoarthritis of right ankle, unspecified osteoarthritis type    43y/o male with R ankle pain, xrays negative for acute change. Likely degenerative changes with acute pain. Will place in air splint with crutches for assistance with weight bearing. Will give naprosyn and percocet for pain, discussed heat therapy and f/up with ortho to discuss ongoing care. I explained the diagnosis and have given explicit precautions to return to the ER including for any other new or worsening symptoms. The patient understands and accepts the medical plan as it's been dictated and I have answered their questions. Discharge instructions concerning home care and prescriptions have been given. The patient is STABLE and is discharged to home in good condition.   BP 140/89  Pulse 113  Temp(Src) 98.9 F (37.2 C) (Oral)  Resp 24  SpO2 96%   Meds ordered this encounter  Medications  . oxyCODONE-acetaminophen (PERCOCET) 5-325  MG per tablet    Sig: Take 1-2 tablets by mouth every 6 (six) hours as needed for severe pain.    Dispense:  6 tablet    Refill:  0    Order Specific Question:  Supervising Provider    Answer:  Eber Hong D [[id number]]  . naproxen (NAPROSYN) 500 MG tablet    Sig: Take 1 tablet (500 mg total) by mouth 2 (two) times daily as needed for mild pain, moderate pain or headache (TAKE WITH MEALS.).    Dispense:  20 tablet    Refill:  0    Order Specific Question:  Supervising Provider    Answer:  Vida Roller [address] Camprubi-Soms, PA-C 09/23/13 [id number]

## 2013-10-11 ENCOUNTER — Emergency Department (HOSPITAL_COMMUNITY): Payer: Self-pay

## 2013-10-11 ENCOUNTER — Encounter (HOSPITAL_COMMUNITY): Payer: Self-pay | Admitting: Emergency Medicine

## 2013-10-11 ENCOUNTER — Emergency Department (HOSPITAL_COMMUNITY)
Admission: EM | Admit: 2013-10-11 | Discharge: 2013-10-11 | Disposition: A | Discharge status: Home / Self Care | Payer: Self-pay | Attending: Emergency Medicine | Admitting: Emergency Medicine

## 2013-10-11 DIAGNOSIS — I10 Essential (primary) hypertension: Secondary | ICD-10-CM | POA: Insufficient documentation

## 2013-10-11 DIAGNOSIS — Z87891 Personal history of nicotine dependence: Secondary | ICD-10-CM | POA: Insufficient documentation

## 2013-10-11 DIAGNOSIS — M25572 Pain in left ankle and joints of left foot: Secondary | ICD-10-CM

## 2013-10-11 DIAGNOSIS — M25579 Pain in unspecified ankle and joints of unspecified foot: Secondary | ICD-10-CM | POA: Insufficient documentation

## 2013-10-11 MED ORDER — IBUPROFEN 800 MG PO TABS: 800.0000 mg | ORAL_TABLET | Freq: Three times a day (TID) | ORAL | Status: DC | PRN

## 2013-10-11 MED ORDER — HYDROCODONE-ACETAMINOPHEN 5-325 MG PO TABS: 1.0000 | ORAL_TABLET | Freq: Four times a day (QID) | ORAL | Status: DC | PRN

## 2013-10-11 NOTE — Discharge Instructions (Signed)
Ice and elevate the ankle. Follow up with the orthopedist

## 2013-10-11 NOTE — Progress Notes (Signed)
  CARE MANAGEMENT ED NOTE 10/11/2013  Patient:  Gene Reed, Gene Reed   Account Number:  [id number]  Date Initiated:  10/11/2013  Documentation initiated by:  Edd Arbour  Subjective/Objective Assessment:   43 yr old self pay [place] pt c/o right ankle pain     Subjective/Objective Assessment Detail:   was in [facility] ED 3 weeks therefore with 2nd [facility] ED visit in 6months for  ankle pain  Pt agreed to [facility] referral     Action/Plan:   ED CM spoke with pt to offer self pay resources see not e below Referral completed - face sheet provided   Action/Plan Detail:   Anticipated DC Date:  10/11/2013     Status Recommendation to Physician:   Result of Recommendation:    Other ED Services  Consult Working Plan    DC Planning Services  Other  PCP issues  Outpatient Services - Pt will follow up  [facility] / [facility] (established/new)    Choice offered to / List presented to:            Status of service:  Completed, signed off  ED Comments:   ED Comments Detail:  CM spoke with pt who confirms self pay [facility] resident with no pcp. CM discussed and provided written information for self pay pcps, importance of pcp for f/u care, www.needymeds.org, discounted pharmacies and other [employer] such as [employer], [employer], affordable care act,  financial assistance, DSS and  health department Reviewed resources for [employer] self pay pcps like [facility] Kussmaul, family medicine at [address], [facility] family practice, general medical clinics, [facility] urgent care plus others, medication resources, [facility] out patient pharmacies and housing Pt voiced understanding and appreciation of resources provided  Provided [facility] contact information

## 2013-10-11 NOTE — ED Notes (Addendum)
Pt aware not to take any tylenol and not to drink alcohol/drive/operate heavy machinery with prescribed medications. Emphasized importance of following up with orthopedic MD and to have BP rechecked. avs in hand.

## 2013-10-11 NOTE — ED Notes (Signed)
Pt reports right ankle pain x 3 weeks and was seen here for the same. Pt has been unable to get an appointment with Dr Despina Hick and is having worse pain and increase difficulty ambulating. Pt seeking a recheck of his ankle.

## 2013-10-11 NOTE — ED Provider Notes (Signed)
CSN: [id number]     Arrival date & time 10/11/13  1326 History  This chart was scribed for non-physician practitioner, Ebbie Ridge, PA-C working with Rolland Porter, MD by Greggory Stallion, ED scribe. This patient was seen in room WTR9/WTR9 and the patient's care was started at 2:16 PM.   Chief Complaint  Patient presents with  . Ankle Pain   The history is provided by the patient. No language interpreter was used.   HPI Comments: Gene Reed is a 43 y.o. male who presents to the Emergency Department complaining of gradual onset throbbing right ankle pain with associated swelling that started 3 weeks ago. Bearing weight worsens the pain. Pt has been using an air cast with some relief. He was seen for the same on 09/23/13 and discharged home with percocet, naproxen and orthopedic follow up. Pt states he was unable to get an appointment with Dr. Despina Hick. States he has past injury to ankle one year ago where he heard a pop and has had intermittent pain since. He is unsure of what the official diagnosis was.   Past Medical History  Diagnosis Date  . Hypertension   . Knee pain    Past Surgical History  Procedure Laterality Date  . Knee surgery     No family history on file. History  Substance Use Topics  . Smoking status: Former Games developer  . Smokeless tobacco: Never Used  . Alcohol Use: Yes     Comment: daily     Review of Systems All other systems negative except as documented in the HPI. All pertinent positives and negatives as reviewed in the HPI.  Allergies  Review of patient's allergies indicates no known allergies.  Home Medications   Prior to Admission medications   Medication Sig Start Date End Date Taking? Authorizing Provider  HYDROcodone-acetaminophen (NORCO/VICODIN) 5-325 MG per tablet Take 1 tablet by mouth every 6 (six) hours as needed for moderate pain. 10/11/13   Jamesetta Orleans Lawyer, PA-C  ibuprofen (ADVIL,MOTRIN) 200 MG tablet Take 600 mg by mouth every 6 (six) hours as  needed for moderate pain.    Historical Provider, MD  ibuprofen (ADVIL,MOTRIN) 800 MG tablet Take 1 tablet (800 mg total) by mouth every 8 (eight) hours as needed. 10/11/13   Jamesetta Orleans Lawyer, PA-C  naproxen (NAPROSYN) 500 MG tablet Take 1 tablet (500 mg total) by mouth 2 (two) times daily as needed for mild pain, moderate pain or headache (TAKE WITH MEALS.). 09/23/13   Mercedes Strupp Camprubi-Soms, PA-C  oxyCODONE-acetaminophen (PERCOCET) 5-325 MG per tablet Take 1-2 tablets by mouth every 6 (six) hours as needed for severe pain. 09/23/13   Mercedes Strupp Camprubi-Soms, PA-C   BP 170/105  Pulse 95  Temp(Src) 98.1 F (36.7 C) (Oral)  Resp 16  SpO2 99%  Physical Exam  Nursing note and vitals reviewed. Constitutional: He is oriented to person, place, and time. He appears well-developed and well-nourished. No distress.  HENT:  Head: Normocephalic and atraumatic.  Eyes: Conjunctivae and EOM are normal.  Neck: Neck supple.  Cardiovascular: Normal rate.   Pulmonary/Chest: Effort normal. No respiratory distress.  Musculoskeletal: Normal range of motion.  Tenderness below right medial and lateral malleoli. Pain with movement. No swelling noted.   Neurological: He is alert and oriented to person, place, and time.  Skin: Skin is warm and dry.  Psychiatric: He has a normal mood and affect. His behavior is normal.    ED Course  Procedures (including critical care time)  DIAGNOSTIC STUDIES:  Oxygen Saturation is 99% on RA, normal by my interpretation.    COORDINATION OF CARE: 2:19 PM-Discussed treatment plan which includes xray with pt at bedside and pt agreed to plan.   Labs Review Labs Reviewed - No data to display  Imaging Review No results found.   EKG Interpretation None      MDM   Final diagnoses:  Ankle pain, left     I personally performed the services described in this documentation, which was scribed in my presence. The recorded information has been reviewed and  is accurate.  Rolland Porter, MD 10/16/13 [phone]

## 2013-11-06 ENCOUNTER — Encounter (HOSPITAL_COMMUNITY): Payer: Self-pay | Admitting: Emergency Medicine

## 2013-11-06 ENCOUNTER — Emergency Department (HOSPITAL_COMMUNITY): Payer: Self-pay

## 2013-11-06 ENCOUNTER — Emergency Department (HOSPITAL_COMMUNITY)
Admission: EM | Admit: 2013-11-06 | Discharge: 2013-11-06 | Disposition: A | Discharge status: Home / Self Care | Payer: Self-pay | Attending: Emergency Medicine | Admitting: Emergency Medicine

## 2013-11-06 DIAGNOSIS — M25572 Pain in left ankle and joints of left foot: Secondary | ICD-10-CM | POA: Insufficient documentation

## 2013-11-06 DIAGNOSIS — Z87891 Personal history of nicotine dependence: Secondary | ICD-10-CM | POA: Insufficient documentation

## 2013-11-06 DIAGNOSIS — M1990 Unspecified osteoarthritis, unspecified site: Secondary | ICD-10-CM | POA: Insufficient documentation

## 2013-11-06 DIAGNOSIS — I10 Essential (primary) hypertension: Secondary | ICD-10-CM | POA: Insufficient documentation

## 2013-11-06 HISTORY — DX: Pain in unspecified ankle and joints of unspecified foot: M25.579

## 2013-11-06 HISTORY — DX: Unspecified osteoarthritis, unspecified site: M19.90

## 2013-11-06 HISTORY — DX: Enthesopathy, unspecified: M77.9

## 2013-11-06 MED ORDER — TRAMADOL HCL 50 MG PO TABS: 50.0000 mg | ORAL_TABLET | Freq: Four times a day (QID) | ORAL | Status: DC | PRN

## 2013-11-06 MED ORDER — MELOXICAM 15 MG PO TABS: 15.0000 mg | ORAL_TABLET | Freq: Every day | ORAL | Status: DC

## 2013-11-06 NOTE — ED Notes (Addendum)
Pt c/o 24 hour hx of pain in l/ankle. Denies trauma.  Recent dx of "bone spurs" of r/foot. Pt is using a crutch to use to help with the limp. Pt has not followed up by locating a PCP. [facility] Victorino DikeHewitt

## 2013-11-06 NOTE — ED Provider Notes (Signed)
Medical screening examination/treatment/procedure(s) were performed by non-physician practitioner and as supervising physician I was immediately available for consultation/collaboration.   EKG Interpretation None        Rolan BuccoMelanie Billye Pickerel, MD 11/06/13 1606

## 2013-11-06 NOTE — ED Provider Notes (Signed)
CSN: [id number]     Arrival date & time 11/06/13  1440 History  This chart was scribed for a non-physician practitioner, Renne Crigler, PA-C, working with Rolan Bucco, MD by Julian Hy, ED Scribe. The patient was seen in WTR5/WTR5. The patient's care was started at 3:08 PM.   Chief Complaint  Patient presents with  . Ankle Pain    l/ankle pain. Denies trauma   The history is provided by the patient. No language interpreter was used.   HPI Comments: Gene Reed is a 43 y.o. male who presents to the Emergency Department complaining of new, moderate, gradually worsening, constant left ankle pain onset 24 hours ago. Pt denies any injury or trauma. Pt states he has been resting his leg. Pt has past medical hx of surgery to repair left MCL and ACL tears in 1990. Pt has previous diagnosis of "bone spurs" on his right foot and ankle and describes his pain as "shooting lightning". Pt currently wears an air splint on his right ankle. Pt notes he takes his time to climb ladders and takes his time lifting objects at work, where he is constantly on his feet. Pt sees Dr. Victorino Dike at [facility] and has a follow-up scheduled. Pt denies past medical history of diabetes, recent infections or abscesses. Pt denies fevers or chills.   Past Medical History  Diagnosis Date  . Hypertension   . Knee pain   . Ankle joint pain   . Bone spur   . Arthritis    Past Surgical History  Procedure Laterality Date  . Knee surgery     No family history on file. History  Substance Use Topics  . Smoking status: Former Games developer  . Smokeless tobacco: Never Used  . Alcohol Use: Yes     Comment: daily     Review of Systems  Constitutional: Negative for fever, chills and activity change.  Musculoskeletal: Positive for arthralgias and gait problem. Negative for back pain, joint swelling and neck pain.  Skin: Negative for wound.  Neurological: Negative for weakness and numbness.      Allergies  Review of  patient's allergies indicates no known allergies.  Home Medications   Prior to Admission medications   Medication Sig Start Date End Date Taking? Authorizing Provider  HYDROcodone-acetaminophen (NORCO/VICODIN) 5-325 MG per tablet Take 1 tablet by mouth every 6 (six) hours as needed for moderate pain. 10/11/13   Jamesetta Orleans Lawyer, PA-C  ibuprofen (ADVIL,MOTRIN) 200 MG tablet Take 600 mg by mouth every 6 (six) hours as needed for moderate pain.    Historical Provider, MD  ibuprofen (ADVIL,MOTRIN) 800 MG tablet Take 1 tablet (800 mg total) by mouth every 8 (eight) hours as needed. 10/11/13   Jamesetta Orleans Lawyer, PA-C  naproxen (NAPROSYN) 500 MG tablet Take 1 tablet (500 mg total) by mouth 2 (two) times daily as needed for mild pain, moderate pain or headache (TAKE WITH MEALS.). 09/23/13   Mercedes Strupp Camprubi-Soms, PA-C  oxyCODONE-acetaminophen (PERCOCET) 5-325 MG per tablet Take 1-2 tablets by mouth every 6 (six) hours as needed for severe pain. 09/23/13   Mercedes Strupp Camprubi-Soms, PA-C   Triage Vitals: BP 161/81  Pulse 103  Temp(Src) 99 F (37.2 C) (Oral)  Resp 18  Wt 260 lb (117.935 kg)  SpO2 99%  Physical Exam  Nursing note and vitals reviewed. Constitutional: He appears well-developed and well-nourished. No distress.  HENT:  Head: Normocephalic and atraumatic.  Eyes: Conjunctivae and EOM are normal.  Neck: Normal range of  motion. Neck supple. No tracheal deviation present.  Cardiovascular: Normal rate and normal pulses.   Pulses:      Dorsalis pedis pulses are 2+ on the right side, and 2+ on the left side.       Posterior tibial pulses are 2+ on the right side, and 2+ on the left side.  Pulmonary/Chest: Effort normal. No respiratory distress.  Musculoskeletal: He exhibits tenderness. He exhibits no edema.       Left knee: Normal.       Left ankle: He exhibits decreased range of motion. He exhibits no swelling. Tenderness. Lateral malleolus (inferior) and medial malleolus  (inferior) tenderness found. No head of 5th metatarsal and no proximal fibula tenderness found. Achilles tendon normal.       Left foot: Normal. He exhibits normal range of motion and no tenderness.       Feet:  Neurological: He is alert. No sensory deficit.  Motor, sensation, and vascular distal to the injury is fully intact.   Skin: Skin is warm and dry.  Psychiatric: He has a normal mood and affect. His behavior is normal.    ED Course  Procedures (including critical care time) DIAGNOSTIC STUDIES: Oxygen Saturation is 99% on RA, normal by my interpretation.    COORDINATION OF CARE: 3:14 PM- Patient informed of current plan for treatment and evaluation and agrees with plan at this time.  Imaging Review Dg Ankle Complete Left  11/06/2013   CLINICAL DATA:  Atraumatic left ankle pain for the past 24 hr; initial encounter  EXAM: LEFT ANKLE COMPLETE - 3+ VIEW  COMPARISON:  None.  FINDINGS: The ankle joint mortise is preserved. The talar dome is intact. There small spurs from the anterior articular surface of the tibia and from the medial malleolus. The talar dome is intact. The soft tissues are unremarkable.  IMPRESSION: There are mild degenerative changes of the left ankle. There is no acute fracture nor dislocation.   Electronically Signed   By: David  SwazilandJordan   On: 11/06/2013 15:28   Vital signs reviewed and are as follows: Filed Vitals:   11/06/13 1449  BP: 161/81  Pulse: 103  Temp: 99 F (37.2 C)  Resp: 18   3:39 PM Pt informed of x-ray results. Will give NSAID and tramadol as well as ASO. Patient was counseled on RICE protocol and told to rest injury, use ice for no longer than 15 minutes every hour, compress the area, and elevate above the level of their heart as much as possible to reduce swelling. Questions answered. Patient verbalized understanding.    PCP referral given for HTN.    MDM   Final diagnoses:  Ankle pain, left   Patient with ankle pain, likely 2/2  degenerative changes. Conservative mgmt indicated with NSAIDs. Short course given. PCP f/u for HTN. Do not feel risk with HTN, history outweighs benefit at this time.   I personally performed the services described in this documentation, which was scribed in my presence. The recorded information has been reviewed and is accurate.    Renne CriglerJoshua Clee Pandit, PA-C 11/06/13 [phone]

## 2013-11-06 NOTE — Discharge Instructions (Signed)
Please read and follow all provided instructions.  Your diagnoses today include:  1. Ankle pain, left     Tests performed today include:  An x-ray of your ankle - does NOT show any broken bones, shows bony spurs  Vital signs. See below for your results today.   Medications prescribed:   Meloxicam - anti-inflammatory pain medication  You have been prescribed an anti-inflammatory medication or NSAID. Take with food. Do not take aspirin, ibuprofen, or naproxen if taking this medication. Take smallest effective dose for the shortest duration needed for your pain. Stop taking if you experience stomach pain or vomiting.    Tramadol - narcotic-like pain medication  DO NOT drive or perform any activities that require you to be awake and alert because this medicine can make you drowsy.   Take any prescribed medications only as directed.  Home care instructions:   Follow any educational materials contained in this packet  Follow R.I.C.E. Protocol:  R - rest your injury   I  - use ice on injury without applying directly to skin  C - compress injury with bandage or splint  E - elevate the injury as much as possible  Follow-up instructions: Please follow-up with your primary care provider or the provided orthopedic (bone specialist) if you continue to have significant pain or trouble walking in 1 week. In this case you may have a severe sprain that requires further care.   Return instructions:   Please return if your toes are numb or tingling, appear gray or blue, or you have severe pain (also elevate leg and loosen splint or wrap)  Please return to the Emergency Department if you experience worsening symptoms.   Please return if you have any other emergent concerns.  Additional Information:  Your vital signs today were: BP 161/81   Pulse 103   Temp(Src) 99 F (37.2 C) (Oral)   Resp 18   Wt 260 lb (117.935 kg)   SpO2 99% If your blood pressure (BP) was elevated above 135/85  this visit, please have this repeated by your doctor within one month. --------------

## 2015-09-04 ENCOUNTER — Emergency Department (HOSPITAL_COMMUNITY): Payer: Self-pay

## 2015-09-04 ENCOUNTER — Emergency Department (HOSPITAL_COMMUNITY)
Admission: EM | Admit: 2015-09-04 | Discharge: 2015-09-04 | Disposition: A | Discharge status: Home / Self Care | Payer: Self-pay | Attending: Emergency Medicine | Admitting: Emergency Medicine

## 2015-09-04 ENCOUNTER — Encounter (HOSPITAL_COMMUNITY): Payer: Self-pay | Admitting: Emergency Medicine

## 2015-09-04 DIAGNOSIS — Z87891 Personal history of nicotine dependence: Secondary | ICD-10-CM | POA: Insufficient documentation

## 2015-09-04 DIAGNOSIS — I10 Essential (primary) hypertension: Secondary | ICD-10-CM | POA: Insufficient documentation

## 2015-09-04 DIAGNOSIS — Z791 Long term (current) use of non-steroidal anti-inflammatories (NSAID): Secondary | ICD-10-CM | POA: Insufficient documentation

## 2015-09-04 DIAGNOSIS — M21371 Foot drop, right foot: Secondary | ICD-10-CM | POA: Insufficient documentation

## 2015-09-04 LAB — COMPREHENSIVE METABOLIC PANEL
ALT: 61 U/L (ref 17–63)
AST: 88 U/L — AB (ref 15–41)
Albumin: 4.4 g/dL (ref 3.5–5.0)
Alkaline Phosphatase: 103 U/L (ref 38–126)
Anion gap: 8 (ref 5–15)
BUN: 12 mg/dL (ref 6–20)
CHLORIDE: 105 mmol/L (ref 101–111)
CO2: 25 mmol/L (ref 22–32)
CREATININE: 0.71 mg/dL (ref 0.61–1.24)
Calcium: 9.4 mg/dL (ref 8.9–10.3)
GFR calc non Af Amer: >60 mL/min (ref >60)
Glucose, Bld: 81 mg/dL (ref 65–99)
POTASSIUM: 3.9 mmol/L (ref 3.5–5.1)
SODIUM: 138 mmol/L (ref 135–145)
Total Bilirubin: 1.5 mg/dL — ABNORMAL HIGH (ref 0.3–1.2)
Total Protein: 8.9 g/dL — ABNORMAL HIGH (ref 6.5–8.1)

## 2015-09-04 LAB — CBC WITH DIFFERENTIAL/PLATELET
BASOS ABS: 0.1 10*3/uL (ref 0.0–0.1)
BASOS PCT: 1 %
EOS ABS: 0.3 10*3/uL (ref 0.0–0.7)
EOS PCT: 3 %
HCT: 39.1 % (ref 39.0–52.0)
Hemoglobin: 14 g/dL (ref 13.0–17.0)
Lymphocytes Relative: 33 %
Lymphs Abs: 3.6 10*3/uL (ref 0.7–4.0)
MCH: 32.3 pg (ref 26.0–34.0)
MCHC: 35.8 g/dL (ref 30.0–36.0)
MCV: 90.3 fL (ref 78.0–100.0)
MONO ABS: 0.7 10*3/uL (ref 0.1–1.0)
Monocytes Relative: 6 %
Neutro Abs: 6.4 10*3/uL (ref 1.7–7.7)
Neutrophils Relative %: 57 %
PLATELETS: 262 10*3/uL (ref 150–400)
RBC: 4.33 MIL/uL (ref 4.22–5.81)
RDW: 12.7 % (ref 11.5–15.5)
WBC: 11 10*3/uL — AB (ref 4.0–10.5)

## 2015-09-04 NOTE — Discharge Instructions (Signed)
Follow-up with orthopedic doctor later this week.

## 2015-09-04 NOTE — Progress Notes (Signed)
Patient listed as not having insurance or a pcp.  EDCM went to speak to patient at bedside, however, EDP at bedside.
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
## 2016-10-29 ENCOUNTER — Emergency Department (HOSPITAL_COMMUNITY)
Admission: EM | Admit: 2016-10-29 | Discharge: 2016-10-29 | Disposition: A | Discharge status: Home / Self Care | Payer: Self-pay | Attending: Emergency Medicine | Admitting: Emergency Medicine

## 2016-10-29 ENCOUNTER — Encounter (HOSPITAL_COMMUNITY): Payer: Self-pay | Admitting: Emergency Medicine

## 2016-10-29 DIAGNOSIS — Z789 Other specified health status: Secondary | ICD-10-CM

## 2016-10-29 DIAGNOSIS — M79672 Pain in left foot: Secondary | ICD-10-CM

## 2016-10-29 DIAGNOSIS — I10 Essential (primary) hypertension: Secondary | ICD-10-CM | POA: Insufficient documentation

## 2016-10-29 DIAGNOSIS — Z7289 Other problems related to lifestyle: Secondary | ICD-10-CM

## 2016-10-29 DIAGNOSIS — M109 Gout, unspecified: Secondary | ICD-10-CM | POA: Insufficient documentation

## 2016-10-29 DIAGNOSIS — Z87891 Personal history of nicotine dependence: Secondary | ICD-10-CM | POA: Insufficient documentation

## 2016-10-29 DIAGNOSIS — F1099 Alcohol use, unspecified with unspecified alcohol-induced disorder: Secondary | ICD-10-CM | POA: Insufficient documentation

## 2016-10-29 MED ORDER — PREDNISONE 20 MG PO TABS: 60.0000 mg | ORAL_TABLET | Freq: Every day | ORAL | 0 refills | Status: AC

## 2016-10-29 NOTE — Discharge Instructions (Signed)
Continue to use your splint as needed for comfort. Ice and elevate foot throughout the day, using ice pack for no more than 20 minutes every hour.  Alternate between tylenol and aleve/ibuprofen for pain relief. Take prednisone as directed until completed, which should help with gout flare if this is what your foot pain ultimately is. Avoid alcohol use, red meat or seafood consumption, and see the list of foods below to avoid to prevent gout. Follow up with the [facility] in 1 week for recheck of symptoms and to establish medical care. Return to the ER for changes or worsening symptoms.

## 2016-10-29 NOTE — ED Provider Notes (Signed)
[facility]-EMERGENCY DEPT Provider Note   CSN: [id number] Arrival date & time: 10/29/16  1253     History   Chief Complaint Chief Complaint  Patient presents with  . Foot Pain    left     HPI Treyvone CRANSTON KOORS is a 46 y.o. male with a PMHx of chronic ankle pain, HTN, arthritis, and bone spurs, who presents to the ED with complaints of  left foot pain that began 2 days ago. He states he has a hx of arthritis in several areas, no known dx of gout, and has had "arthritis" flares in the past in this area but it usually goes away on its own after a few days. This time the symptoms aren't improving with time. He describes his pain as 8/10 constant aching/throbbing nonradiating dorsal lateral L foot pain which worsens with walking on it, and improves somewhat with aleve. He reports associated erythema and swelling. Denies injury to the foot. No known family hx of gout, but states he's adopted and doesn't have much contact with his biological family. He admits to drinking EtOH daily. He ate fish and shrimp the day before onset of symptoms. He denies warmth to the area, bruising, fevers, chills, CP, SOB, abd pain, N/V/D/C, hematuria, dysuria, myalgias, numbness, tingling, focal weakness, or any other complaints at this time.    The history is provided by the patient and medical records. No language interpreter was used.  Foot Pain  This is a new problem. The current episode started 2 days ago. The problem occurs constantly. The problem has not changed since onset.Pertinent negatives include no chest pain, no abdominal pain and no shortness of breath. The symptoms are aggravated by walking. The symptoms are relieved by NSAIDs and rest. Treatments tried: rest and aleve. The treatment provided mild relief.    Past Medical History:  Diagnosis Date  . Ankle joint pain   . Arthritis   . Bone spur   . Hypertension   . Knee pain     There are no active problems to display for this patient.   Past Surgical  History:  Procedure Laterality Date  . KNEE SURGERY         Home Medications    Prior to Admission medications   Medication Sig Start Date End Date Taking? Authorizing Provider  HYDROcodone-acetaminophen (NORCO/VICODIN) 5-325 MG per tablet Take 1 tablet by mouth every 6 (six) hours as needed for moderate pain. Patient not taking: Reported on 09/04/2015 10/11/13   Charlestine Night, PA-C  ibuprofen (ADVIL,MOTRIN) 800 MG tablet Take 1 tablet (800 mg total) by mouth every 8 (eight) hours as needed. Patient not taking: Reported on 09/04/2015 10/11/13   Charlestine Night, PA-C  meloxicam (MOBIC) 15 MG tablet Take 1 tablet (15 mg total) by mouth daily. Patient not taking: Reported on 09/04/2015 11/06/13   Renne Crigler, PA-C  naproxen (NAPROSYN) 500 MG tablet Take 1 tablet (500 mg total) by mouth 2 (two) times daily as needed for mild pain, moderate pain or headache (TAKE WITH MEALS.). Patient not taking: Reported on 09/04/2015 09/23/13   Khamil Lamica, [place], PA-C  naproxen sodium (ANAPROX) 220 MG tablet Take 440 mg by mouth 2 (two) times daily as needed (pain).    [provider]  oxyCODONE-acetaminophen (PERCOCET) 5-325 MG per tablet Take 1-2 tablets by mouth every 6 (six) hours as needed for severe pain. Patient not taking: Reported on 09/04/2015 09/23/13   Falon Flinchum, [place], PA-C  traMADol (ULTRAM) 50 MG tablet Take 1 tablet (50 mg  total) by mouth every 6 (six) hours as needed. Patient not taking: Reported on 09/04/2015 11/06/13   Renne Crigler, PA-C    Family History No family history on file.  Social History Social History  Substance Use Topics  . Smoking status: Former Games developer  . Smokeless tobacco: Never Used  . Alcohol use Yes     Comment: daily      Allergies   Patient has no known allergies.   Review of Systems Review of Systems  Constitutional: Negative for chills and fever.  Respiratory: Negative for shortness of breath.   Cardiovascular: Negative for chest pain.    Gastrointestinal: Negative for abdominal pain, constipation, diarrhea, nausea and vomiting.  Genitourinary: Negative for dysuria and hematuria.  Musculoskeletal: Positive for arthralgias and joint swelling.  Skin: Positive for color change. Negative for wound.  Allergic/Immunologic: Negative for immunocompromised state.  Neurological: Negative for weakness and numbness.  Psychiatric/Behavioral: Negative for confusion.   All other systems reviewed and are negative for acute change except as noted in the HPI.    Physical Exam Updated Vital Signs BP (!) 163/101 (BP Location: Right Arm)   Pulse (!) 103   Temp 98.1 F (36.7 C) (Oral)   Resp 18   Ht  (1.753 m)   Wt 111.1 kg (245 lb)   SpO2 96%   BMI 36.18 kg/m   Physical Exam  Constitutional: He is oriented to person, place, and time. Vital signs are normal. He appears well-developed and well-nourished.  Non-toxic appearance. No distress.  Afebrile, nontoxic, NAD  HENT:  Head: Normocephalic and atraumatic.  Mouth/Throat: Mucous membranes are normal.  Eyes: Conjunctivae and EOM are normal. Right eye exhibits no discharge. Left eye exhibits no discharge.  Neck: Normal range of motion. Neck supple.  Cardiovascular: Normal rate and intact distal pulses.   No tachycardia during exam  Pulmonary/Chest: Effort normal. No respiratory distress.  Abdominal: Normal appearance. He exhibits no distension.  Musculoskeletal: Normal range of motion.       Left foot: There is tenderness and swelling. There is normal range of motion, normal capillary refill, no crepitus, no deformity and no laceration.       Feet:  L foot with mild swelling and erythema to dorsal aspect of lateral foot, over middle of 3rd-5th metatarsals, with very trace warmth to this area, mild TTP in this area, no tenderness/erythema/swelling/warmth to the ankle or remainder of foot/leg. FROM intact in ankle and all digits. No crepitus or deformity, skin intact without  wounds, no pedal edema or calf tenderness, strength and sensation grossly intact, distal pulses intact, soft compartments. 1st MTP joint unaffected.   Neurological: He is alert and oriented to person, place, and time. He has normal strength. No sensory deficit.  Skin: Skin is warm, dry and intact. No rash noted.  Psychiatric: He has a normal mood and affect.  Nursing note and vitals reviewed.    ED Treatments / Results  Labs (all labs ordered are listed, but only abnormal results are displayed) Labs Reviewed - No data to display  EKG  EKG Interpretation None       Radiology No results found.  Procedures Procedures (including critical care time)  Medications Ordered in ED Medications - No data to display   Initial Impression / Assessment and Plan / ED Course  I have reviewed the triage vital signs and the nursing notes.  Pertinent labs & imaging results that were available during my care of the patient were reviewed by me and  considered in my medical decision making (see chart for details).     46 y.o. male here with L foot pain x2 days. Hx of arthritis, no known hx of gout, but noticed some swelling and redness to dorsal aspect of lateral L foot. +EtOH consumption daily. Ate seafood the day prior to onset. On exam, mild swelling and erythema to dorsal lateral L foot, mild TTP to this area, trace warmth, skin intact without lacerations or abrasions, no 1st MTP joint tenderness, FROM intact in ankle and toes, no tenderness to ankle, NVI with soft compartments. Clinically consistent with gout, although very strange location, but feet are not an uncommon place to get it (usually 1st MTP, but not unheard of to have it in other areas). Doubt cellulitis or DVT. Pt already has a splint which helps him, so advised use of this for comfort. No recent labs except for 16yr ago, so unclear if kidney function may be diminished or not; will opt to use prednisone instead of colchicine,  particularly since gout is a presumptive diagnosis. Advised use of NSAIDs and tylenol, RICE discussed, and f/up with [facility] in 1wk to establish care and recheck on symptoms. Doubt need for imaging or labs at this time. Advised EtOH cessation, and diet modifications to help with gout. I explained the diagnosis and have given explicit precautions to return to the ER including for any other new or worsening symptoms. The patient understands and accepts the medical plan as it's been dictated and I have answered their questions. Discharge instructions concerning home care and prescriptions have been given. The patient is STABLE and is discharged to home in good condition.    Final Clinical Impressions(s) / ED Diagnoses   Final diagnoses:  Left foot pain  Acute gout of left foot, unspecified cause  Alcohol use    New Prescriptions New Prescriptions   PREDNISONE (DELTASONE) 20 MG TABLET    Take 3 tablets (60 mg total) by mouth daily with breakfast.     [address], [place], [place] 10/29/16 1616    Gerhard Munch, MD 10/29/16 1700

## 2016-10-29 NOTE — ED Triage Notes (Signed)
Pt from home with c/o left foot pain x 7 months. Pt states this flair up got worse around Sunday. Pt has a brace on his foot and is able to ambulate without alteration in gait. Pt rates pain 8/10. Pt is able to move toes without pain.

## 2016-11-20 ENCOUNTER — Ambulatory Visit (INDEPENDENT_AMBULATORY_CARE_PROVIDER_SITE_OTHER): Payer: Self-pay | Admitting: Family Medicine

## 2016-11-20 ENCOUNTER — Encounter: Payer: Self-pay | Admitting: Family Medicine

## 2016-11-20 VITALS — BP 156/88 | HR 88 | Temp 98.3°F | Resp 16 | Ht 69.0 in | Wt 256.0 lb

## 2016-11-20 DIAGNOSIS — I10 Essential (primary) hypertension: Secondary | ICD-10-CM

## 2016-11-20 DIAGNOSIS — R809 Proteinuria, unspecified: Secondary | ICD-10-CM

## 2016-11-20 DIAGNOSIS — R319 Hematuria, unspecified: Secondary | ICD-10-CM

## 2016-11-20 DIAGNOSIS — Z114 Encounter for screening for human immunodeficiency virus [HIV]: Secondary | ICD-10-CM

## 2016-11-20 DIAGNOSIS — Z789 Other specified health status: Secondary | ICD-10-CM

## 2016-11-20 DIAGNOSIS — Z6837 Body mass index (BMI) 37.0-37.9, adult: Secondary | ICD-10-CM

## 2016-11-20 DIAGNOSIS — Z7289 Other problems related to lifestyle: Secondary | ICD-10-CM

## 2016-11-20 DIAGNOSIS — M109 Gout, unspecified: Secondary | ICD-10-CM

## 2016-11-20 DIAGNOSIS — E6609 Other obesity due to excess calories: Secondary | ICD-10-CM

## 2016-11-20 LAB — POCT URINALYSIS DIP (DEVICE)
Glucose, UA: NEGATIVE mg/dL
KETONES UR: NEGATIVE mg/dL
LEUKOCYTES UA: NEGATIVE
NITRITE: NEGATIVE
Protein, ur: 100 mg/dL — AB
Specific Gravity, Urine: >=1.03 (ref 1.005–1.030)
Urobilinogen, UA: 0.2 mg/dL (ref 0.0–1.0)
pH: 5.5 (ref 5.0–8.0)

## 2016-11-20 LAB — POCT GLYCOSYLATED HEMOGLOBIN (HGB A1C): Hemoglobin A1C: 4.5

## 2016-11-20 MED ORDER — PREDNISONE 20 MG PO TABS: 60.0000 mg | ORAL_TABLET | Freq: Every day | ORAL | 0 refills | Status: AC

## 2016-11-20 MED ORDER — AMLODIPINE BESYLATE 5 MG PO TABS: 5.0000 mg | ORAL_TABLET | Freq: Every day | ORAL | 5 refills | Status: DC

## 2016-11-20 MED ORDER — PREDNISONE 20 MG PO TABS: 60.0000 mg | ORAL_TABLET | Freq: Every day | ORAL | 0 refills | Status: DC

## 2016-11-20 NOTE — Patient Instructions (Addendum)
Probable gout:  Prednisone 60 mg for 4 days.  Will follow up by phone with any medication change  Hypertension:  Amlodipine 5 mg for blood pressure.  Return in 2 weeks for a blood pressure check  1 month for hypertension     DASH Eating Plan DASH stands for "Dietary Approaches to Stop Hypertension." The DASH eating plan is a healthy eating plan that has been shown to reduce high blood pressure (hypertension). It may also reduce your risk for type 2 diabetes, heart disease, and stroke. The DASH eating plan may also help with weight loss. What are tips for following this plan? General guidelines Avoid eating more than 2,300 mg (milligrams) of salt (sodium) a day. If you have hypertension, you may need to reduce your sodium intake to 1,500 mg a day. Limit alcohol intake to no more than 1 drink a day for nonpregnant women and 2 drinks a day for men. One drink equals 12 oz of beer, 5 oz of wine, or 1 oz of hard liquor. Work with your health care provider to maintain a healthy body weight or to lose weight. Ask what an ideal weight is for you. Get at least 30 minutes of exercise that causes your heart to beat faster (aerobic exercise) most days of the week. Activities may include walking, swimming, or biking. Work with your health care provider or diet and nutrition specialist (dietitian) to adjust your eating plan to your individual calorie needs. Reading food labels Check food labels for the amount of sodium per serving. Choose foods with less than 5 percent of the Daily Value of sodium. Generally, foods with less than 300 mg of sodium per serving fit into this eating plan. To find whole grains, look for the word "whole" as the first word in the ingredient list. Shopping Buy products labeled as "low-sodium" or "no salt added." Buy fresh foods. Avoid canned foods and premade or frozen meals. Cooking Avoid adding salt when cooking. Use salt-free seasonings or herbs instead of table salt or  sea salt. Check with your health care provider or pharmacist before using salt substitutes. Do not fry foods. Cook foods using healthy methods such as baking, boiling, grilling, and broiling instead. Cook with heart-healthy oils, such as olive, canola, soybean, or sunflower oil. Meal planning  Eat a balanced diet that includes: 5 or more servings of fruits and vegetables each day. At each meal, try to fill half of your plate with fruits and vegetables. Up to 6-8 servings of whole grains each day. Less than 6 oz of lean meat, poultry, or fish each day. A 3-oz serving of meat is about the same size as a deck of cards. One egg equals 1 oz. 2 servings of low-fat dairy each day. A serving of nuts, seeds, or beans 5 times each week. Heart-healthy fats. Healthy fats called Omega-3 fatty acids are found in foods such as flaxseeds and coldwater fish, like sardines, salmon, and mackerel. Limit how much you eat of the following: Canned or prepackaged foods. Food that is high in trans fat, such as fried foods. Food that is high in saturated fat, such as fatty meat. Sweets, desserts, sugary drinks, and other foods with added sugar. Full-fat dairy products. Do not salt foods before eating. Try to eat at least 2 vegetarian meals each week. Eat more home-cooked food and less restaurant, buffet, and fast food. When eating at a restaurant, ask that your food be prepared with less salt or no salt, if possible. What  foods are recommended? The items listed may not be a complete list. Talk with your dietitian about what dietary choices are best for you. Grains Whole-grain or whole-wheat bread. Whole-grain or whole-wheat pasta. Brown rice. Orpah Cobb. Bulgur. Whole-grain and low-sodium cereals. Pita bread. Low-fat, low-sodium crackers. Whole-wheat flour tortillas. Vegetables Fresh or frozen vegetables (raw, steamed, roasted, or grilled). Low-sodium or reduced-sodium tomato and vegetable juice. Low-sodium or  reduced-sodium tomato sauce and tomato paste. Low-sodium or reduced-sodium canned vegetables. Fruits All fresh, dried, or frozen fruit. Canned fruit in natural juice (without added sugar). Meat and other protein foods Skinless chicken or [place]. Ground chicken or [place]. Pork with fat trimmed off. Fish and seafood. Egg whites. Dried beans, peas, or lentils. Unsalted nuts, nut butters, and seeds. Unsalted canned beans. Lean cuts of beef with fat trimmed off. Low-sodium, lean deli meat. Dairy Low-fat (1%) or fat-free (skim) milk. Fat-free, low-fat, or reduced-fat cheeses. Nonfat, low-sodium ricotta or cottage cheese. Low-fat or nonfat yogurt. Low-fat, low-sodium cheese. Fats and oils Soft margarine without trans fats. Vegetable oil. Low-fat, reduced-fat, or light mayonnaise and salad dressings (reduced-sodium). Canola, safflower, olive, soybean, and sunflower oils. Avocado. Seasoning and other foods Herbs. Spices. Seasoning mixes without salt. Unsalted popcorn and pretzels. Fat-free sweets. What foods are not recommended? The items listed may not be a complete list. Talk with your dietitian about what dietary choices are best for you. Grains Baked goods made with fat, such as croissants, muffins, or some breads. Dry pasta or rice meal packs. Vegetables Creamed or fried vegetables. Vegetables in a cheese sauce. Regular canned vegetables (not low-sodium or reduced-sodium). Regular canned tomato sauce and paste (not low-sodium or reduced-sodium). Regular tomato and vegetable juice (not low-sodium or reduced-sodium). Rosita Fire. Olives. Fruits Canned fruit in a light or heavy syrup. Fried fruit. Fruit in cream or butter sauce. Meat and other protein foods Fatty cuts of meat. Ribs. Fried meat. Tomasa Blase. Sausage. Bologna and other processed lunch meats. Salami. Fatback. Hotdogs. Bratwurst. Salted nuts and seeds. Canned beans with added salt. Canned or smoked fish. Whole eggs or egg yolks. Chicken or [place]  with skin. Dairy Whole or 2% milk, cream, and half-and-half. Whole or full-fat cream cheese. Whole-fat or sweetened yogurt. Full-fat cheese. Nondairy creamers. Whipped toppings. Processed cheese and cheese spreads. Fats and oils Butter. Stick margarine. Lard. Shortening. Ghee. Bacon fat. Tropical oils, such as coconut, palm kernel, or palm oil. Seasoning and other foods Salted popcorn and pretzels. Onion salt, garlic salt, seasoned salt, table salt, and sea salt. Worcestershire sauce. Tartar sauce. Barbecue sauce. Teriyaki sauce. Soy sauce, including reduced-sodium. Steak sauce. Canned and packaged gravies. Fish sauce. Oyster sauce. Cocktail sauce. Horseradish that you find on the shelf. Ketchup. Mustard. Meat flavorings and tenderizers. Bouillon cubes. Hot sauce and Tabasco sauce. Premade or packaged marinades. Premade or packaged taco seasonings. Relishes. Regular salad dressings. Where to find more information: National Heart, Lung, and Blood Institute: PopSteam.is American Heart Association: www.heart.org Summary The DASH eating plan is a healthy eating plan that has been shown to reduce high blood pressure (hypertension). It may also reduce your risk for type 2 diabetes, heart disease, and stroke. With the DASH eating plan, you should limit salt (sodium) intake to 2,300 mg a day. If you have hypertension, you may need to reduce your sodium intake to 1,500 mg a day. When on the DASH eating plan, aim to eat more fresh fruits and vegetables, whole grains, lean proteins, low-fat dairy, and heart-healthy fats. Work with your health care provider or diet  and nutrition specialist (dietitian) to adjust your eating plan to your individual calorie needs. This information is not intended to replace advice given to you by your health care provider. Make sure you discuss any questions you have with your health care provider. Document Released: 01/10/2011 Document Revised: 01/15/2016 Document Reviewed:  01/15/2016 Elsevier Interactive Patient Education  2017 [employer].   [facility] [address] [phone]   Refrain from daily alcohol to improve symptoms of gout Recommend a low purine  Low-Purine Diet Purines are compounds that affect the level of uric acid in your body. A low-purine diet is a diet that is low in purines. Eating a low-purine diet can prevent the level of uric acid in your body from getting too high and causing gout or kidney stones or both. What do I need to know about this diet?  Choose low-purine foods. Examples of low-purine foods are listed in the next section.  Drink plenty of fluids, especially water. Fluids can help remove uric acid from your body. Try to drink 8-16 cups (1.9-3.8 L) a day.  Limit foods high in fat, especially saturated fat, as fat makes it harder for the body to get rid of uric acid. Foods high in saturated fat include pizza, cheese, ice cream, whole milk, fried foods, and gravies. Choose foods that are lower in fat and lean sources of protein. Use olive oil when cooking as it contains healthy fats that are not high in saturated fat.  Limit alcohol. Alcohol interferes with the elimination of uric acid from your body. If you are having a gout attack, avoid all alcohol.  Keep in mind that different people's bodies react differently to different foods. You will probably learn over time which foods do or do not affect you. If you discover that a food tends to cause your gout to flare up, avoid eating that food. You can more freely enjoy foods that do not cause problems. If you have any questions about a food item, talk to your dietitian or health care provider. Which foods are low, moderate, and high in purines? The following is a list of foods that are low, moderate, and high in purines. You can eat any amount of the foods that are low in purines. You may be able to have small amounts of foods that are moderate in purines.  Ask your health care provider how much of a food moderate in purines you can have. Avoid foods high in purines. Grains  Foods low in purines: Enriched white bread, pasta, rice, cake, cornbread, popcorn.  Foods moderate in purines: Whole-grain breads and cereals, wheat germ, bran, oatmeal. Uncooked oatmeal. Dry wheat bran or wheat germ.  Foods high in purines: Pancakes, [place] toast, biscuits, muffins. Vegetables  Foods low in purines: All vegetables, except those that are moderate in purines.  Foods moderate in purines: Asparagus, cauliflower, spinach, mushrooms, green peas. Fruits  All fruits are low in purines. Meats and other Protein Foods  Foods low in purines: Eggs, nuts, peanut butter.  Foods moderate in purines: 80-90% lean beef, lamb, veal, pork, poultry, fish, eggs, peanut butter, nuts. Crab, lobster, oysters, and shrimp. Cooked dried beans, peas, and lentils.  Foods high in purines: Anchovies, sardines, herring, mussels, tuna, codfish, scallops, trout, and haddock. Tomasa Blase. Organ meats (such as liver or kidney). Tripe. Game meat. Goose. Sweetbreads. Dairy  All dairy foods are low in purines. Low-fat and fat-free dairy products are best because they are low in saturated fat. Beverages  Drinks low in purines: Water, carbonated beverages, tea, coffee, cocoa.  Drinks moderate in purines: Soft drinks and other drinks sweetened with high-fructose corn syrup. Juices. To find whether a food or drink is sweetened with high-fructose corn syrup, look at the ingredients list.  Drinks high in purines: Alcoholic beverages (such as beer). Condiments  Foods low in purines: Salt, herbs, olives, pickles, relishes, vinegar.  Foods moderate in purines: Butter, margarine, oils, mayonnaise. Fats and Oils  Foods low in purines: All types, except gravies and sauces made with meat.  Foods high in purines: Gravies and sauces made with meat. Other Foods  Foods low in purines: Sugars,  sweets, gelatin. Cake. Soups made without meat.  Foods moderate in purines: Meat-based or fish-based soups, broths, or bouillons. Foods and drinks sweetened with high-fructose corn syrup.  Foods high in purines: High-fat desserts (such as ice cream, cookies, cakes, pies, doughnuts, and chocolate). Contact your dietitian for more information on foods that are not listed here. This information is not intended to replace advice given to you by your health care provider. Make sure you discuss any questions you have with your health care provider. Document Released: 05/18/2010 Document Revised: 06/29/2015 Document Reviewed: 12/28/2012 Elsevier Interactive Patient Education  2017 [employer].
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
## 2016-11-21 ENCOUNTER — Other Ambulatory Visit: Payer: Self-pay | Admitting: Family Medicine

## 2016-11-21 ENCOUNTER — Telehealth: Payer: Self-pay

## 2016-11-21 DIAGNOSIS — M10279 Drug-induced gout, unspecified ankle and foot: Secondary | ICD-10-CM

## 2016-11-21 LAB — RHEUMATOID FACTOR: Rhuematoid fact SerPl-aCnc: <14 IU/mL (ref <14)

## 2016-11-21 LAB — CBC WITH DIFFERENTIAL/PLATELET
BASOS ABS: 62 {cells}/uL (ref 0–200)
Basophils Relative: 0.5 %
EOS ABS: 86 {cells}/uL (ref 15–500)
Eosinophils Relative: 0.7 %
HCT: 38 % — ABNORMAL LOW (ref 38.5–50.0)
Hemoglobin: 13.3 g/dL (ref 13.2–17.1)
Lymphs Abs: 2116 cells/uL (ref 850–3900)
MCH: 31.7 pg (ref 27.0–33.0)
MCHC: 35 g/dL (ref 32.0–36.0)
MCV: 90.5 fL (ref 80.0–100.0)
MONOS PCT: 3.4 %
MPV: 9.7 fL (ref 7.5–12.5)
NEUTROS PCT: 78.2 %
Neutro Abs: 9619 cells/uL — ABNORMAL HIGH (ref 1500–7800)
PLATELETS: 267 10*3/uL (ref 140–400)
RBC: 4.2 10*6/uL (ref 4.20–5.80)
RDW: 12.5 % (ref 11.0–15.0)
TOTAL LYMPHOCYTE: 17.2 %
WBC: 12.3 10*3/uL — ABNORMAL HIGH (ref 3.8–10.8)
WBCMIX: 418 {cells}/uL (ref 200–950)

## 2016-11-21 LAB — MICROALBUMIN / CREATININE URINE RATIO
CREATININE, URINE: 283 mg/dL (ref 20–320)
Microalb Creat Ratio: 37 mcg/mg creat — ABNORMAL HIGH (ref <30)
Microalb, Ur: 10.5 mg/dL

## 2016-11-21 LAB — COMPLETE METABOLIC PANEL WITH GFR
AG RATIO: 1.3 (calc) (ref 1.0–2.5)
ALT: 22 U/L (ref 9–46)
AST: 27 U/L (ref 10–40)
Albumin: 4.3 g/dL (ref 3.6–5.1)
Alkaline phosphatase (APISO): 79 U/L (ref 40–115)
BUN: 13 mg/dL (ref 7–25)
CALCIUM: 9 mg/dL (ref 8.6–10.3)
CO2: 22 mmol/L (ref 20–32)
Chloride: 105 mmol/L (ref 98–110)
Creat: 0.66 mg/dL (ref 0.60–1.35)
GFR, EST AFRICAN AMERICAN: 135 mL/min/{1.73_m2} (ref >=60)
GFR, EST NON AFRICAN AMERICAN: 117 mL/min/{1.73_m2} (ref >=60)
Globulin: 3.3 g/dL (calc) (ref 1.9–3.7)
Glucose, Bld: 108 mg/dL — ABNORMAL HIGH (ref 65–99)
POTASSIUM: 4.2 mmol/L (ref 3.5–5.3)
SODIUM: 138 mmol/L (ref 135–146)
TOTAL PROTEIN: 7.6 g/dL (ref 6.1–8.1)
Total Bilirubin: 1 mg/dL (ref 0.2–1.2)

## 2016-11-21 LAB — URINE CULTURE
MICRO NUMBER: 81159276
RESULT: NO GROWTH
SPECIMEN QUALITY: ADEQUATE

## 2016-11-21 LAB — TSH: TSH: 1.33 mIU/L (ref 0.40–4.50)

## 2016-11-21 LAB — HIV ANTIBODY (ROUTINE TESTING W REFLEX): HIV 1&2 Ab, 4th Generation: NONREACTIVE

## 2016-11-21 LAB — C-REACTIVE PROTEIN: CRP: 4.7 mg/L (ref <8.0)

## 2016-11-21 LAB — SEDIMENTATION RATE: SED RATE: 29 mm/h — AB (ref 0–15)

## 2016-11-21 LAB — URIC ACID: Uric Acid, Serum: 8.1 mg/dL — ABNORMAL HIGH (ref 4.0–8.0)

## 2016-11-21 MED ORDER — ALLOPURINOL 100 MG PO TABS: 100.0000 mg | ORAL_TABLET | Freq: Every day | ORAL | 5 refills | Status: DC

## 2016-11-21 NOTE — Telephone Encounter (Signed)
Called, no answer. No voicemail set up, will try later. Thanks!

## 2016-11-21 NOTE — Progress Notes (Signed)
Reviewed labs from 11/20/2016, uric acid elevated. History of gout. Will start a trial of allopurinol 100 mg daily. Will follow up in 1 month and repeat uric acid at that time.   Nolon NationsLaChina Moore Hollis  MSN, FNP-C Patient Care [facility] [address]  [place], [place] [address] [phone]

## 2016-11-21 NOTE — Telephone Encounter (Signed)
-----   Message from Massie MaroonLachina M Hollis, [place]NP sent at 11/21/2016  1:47 PM EDT ----- Regarding: lab results Please inform patient that uric acid is elevated, will start a trial of allopurinol. The nature of gout was fully explained, including dietary relationship, acute and interval phase and treatment of both. Long term complications such as kidney stones, tophi and arthritis are discussed. Avoidance of alcohol recommended, and written literature was given along with a low purine diet.   Follow up as scheduled, will repeat uric acid at that time.

## 2016-11-22 LAB — URINALYSIS, MICROSCOPIC ONLY
Hyaline Cast: NONE SEEN /LPF
Squamous Epithelial / LPF: NONE SEEN /HPF (ref <=5)

## 2016-11-22 NOTE — Telephone Encounter (Signed)
Called, no answer and no voicemail set up will try later. Thanks!  

## 2016-11-25 NOTE — Telephone Encounter (Signed)
Have tried to contact patient by phone with no success . Will mail letter. Thanks!

## 2016-12-23 ENCOUNTER — Encounter: Payer: Self-pay | Admitting: Family Medicine

## 2016-12-23 ENCOUNTER — Ambulatory Visit: Payer: Self-pay | Admitting: Family Medicine

## 2016-12-23 ENCOUNTER — Ambulatory Visit (INDEPENDENT_AMBULATORY_CARE_PROVIDER_SITE_OTHER): Payer: Self-pay | Admitting: Family Medicine

## 2016-12-23 VITALS — BP 140/92 | HR 83 | Temp 98.3°F | Resp 16 | Ht 69.0 in | Wt 263.0 lb

## 2016-12-23 DIAGNOSIS — R55 Syncope and collapse: Secondary | ICD-10-CM | POA: Insufficient documentation

## 2016-12-23 DIAGNOSIS — M109 Gout, unspecified: Secondary | ICD-10-CM

## 2016-12-23 DIAGNOSIS — I10 Essential (primary) hypertension: Secondary | ICD-10-CM

## 2016-12-23 NOTE — Patient Instructions (Addendum)
Your blood pressure is controlled on current regimen. Low-Purine Diet Purines are compounds that affect the level of uric acid in your body. A low-purine diet is a diet that is low in purines. Eating a low-purine diet can prevent the level of uric acid in your body from getting too high and causing gout or kidney stones or both. What do I need to know about this diet?  Choose low-purine foods. Examples of low-purine foods are listed in the next section.  Drink plenty of fluids, especially water. Fluids can help remove uric acid from your body. Try to drink 8-16 cups (1.9-3.8 L) a day.  Limit foods high in fat, especially saturated fat, as fat makes it harder for the body to get rid of uric acid. Foods high in saturated fat include pizza, cheese, ice cream, whole milk, fried foods, and gravies. Choose foods that are lower in fat and lean sources of protein. Use olive oil when cooking as it contains healthy fats that are not high in saturated fat.  Limit alcohol. Alcohol interferes with the elimination of uric acid from your body. If you are having a gout attack, avoid all alcohol.  Keep in mind that different people's bodies react differently to different foods. You will probably learn over time which foods do or do not affect you. If you discover that a food tends to cause your gout to flare up, avoid eating that food. You can more freely enjoy foods that do not cause problems. If you have any questions about a food item, talk to your dietitian or health care provider. Which foods are low, moderate, and high in purines? The following is a list of foods that are low, moderate, and high in purines. You can eat any amount of the foods that are low in purines. You may be able to have small amounts of foods that are moderate in purines. Ask your health care provider how much of a food moderate in purines you can have. Avoid foods high in purines. Grains  Foods low in purines: Enriched white bread, pasta,  rice, cake, cornbread, popcorn.  Foods moderate in purines: Whole-grain breads and cereals, wheat germ, bran, oatmeal. Uncooked oatmeal. Dry wheat bran or wheat germ.  Foods high in purines: Pancakes, [place] toast, biscuits, muffins. Vegetables  Foods low in purines: All vegetables, except those that are moderate in purines.  Foods moderate in purines: Asparagus, cauliflower, spinach, mushrooms, green peas. Fruits  All fruits are low in purines. Meats and other Protein Foods  Foods low in purines: Eggs, nuts, peanut butter.  Foods moderate in purines: 80-90% lean beef, lamb, veal, pork, poultry, fish, eggs, peanut butter, nuts. Crab, lobster, oysters, and shrimp. Cooked dried beans, peas, and lentils.  Foods high in purines: Anchovies, sardines, herring, mussels, tuna, codfish, scallops, trout, and haddock. Tomasa BlaseBacon. Organ meats (such as liver or kidney). Tripe. Game meat. Goose. Sweetbreads. Dairy  All dairy foods are low in purines. Low-fat and fat-free dairy products are best because they are low in saturated fat. Beverages  Drinks low in purines: Water, carbonated beverages, tea, coffee, cocoa.  Drinks moderate in purines: Soft drinks and other drinks sweetened with high-fructose corn syrup. Juices. To find whether a food or drink is sweetened with high-fructose corn syrup, look at the ingredients list.  Drinks high in purines: Alcoholic beverages (such as beer). Condiments  Foods low in purines: Salt, herbs, olives, pickles, relishes, vinegar.  Foods moderate in purines: Butter, margarine, oils, mayonnaise. Fats and Oils  Foods low in purines: All types, except gravies and sauces made with meat.  Foods high in purines: Gravies and sauces made with meat. Other Foods  Foods low in purines: Sugars, sweets, gelatin. Cake. Soups made without meat.  Foods moderate in purines: Meat-based or fish-based soups, broths, or bouillons. Foods and drinks sweetened with high-fructose  corn syrup.  Foods high in purines: High-fat desserts (such as ice cream, cookies, cakes, pies, doughnuts, and chocolate). Contact your dietitian for more information on foods that are not listed here. This information is not intended to replace advice given to you by your health care provider. Make sure you discuss any questions you have with your health care provider. Document Released: 05/18/2010 Document Revised: 06/29/2015 Document Reviewed: 12/28/2012 Elsevier Interactive Patient Education  2017 Elsevier Inc.  DASH Eating Plan DASH stands for "Dietary Approaches to Stop Hypertension." The DASH eating plan is a healthy eating plan that has been shown to reduce high blood pressure (hypertension). It may also reduce your risk for type 2 diabetes, heart disease, and stroke. The DASH eating plan may also help with weight loss. What are tips for following this plan? General guidelines  Avoid eating more than 2,300 mg (milligrams) of salt (sodium) a day. If you have hypertension, you may need to reduce your sodium intake to 1,500 mg a day.  Limit alcohol intake to no more than 1 drink a day for nonpregnant women and 2 drinks a day for men. One drink equals 12 oz of beer, 5 oz of wine, or 1 oz of hard liquor.  Work with your health care provider to maintain a healthy body weight or to lose weight. Ask what an ideal weight is for you.  Get at least 30 minutes of exercise that causes your heart to beat faster (aerobic exercise) most days of the week. Activities may include walking, swimming, or biking.  Work with your health care provider or diet and nutrition specialist (dietitian) to adjust your eating plan to your individual calorie needs. Reading food labels  Check food labels for the amount of sodium per serving. Choose foods with less than 5 percent of the Daily Value of sodium. Generally, foods with less than 300 mg of sodium per serving fit into this eating plan.  To find whole  grains, look for the word "whole" as the first word in the ingredient list. Shopping  Buy products labeled as "low-sodium" or "no salt added."  Buy fresh foods. Avoid canned foods and premade or frozen meals. Cooking  Avoid adding salt when cooking. Use salt-free seasonings or herbs instead of table salt or sea salt. Check with your health care provider or pharmacist before using salt substitutes.  Do not fry foods. Cook foods using healthy methods such as baking, boiling, grilling, and broiling instead.  Cook with heart-healthy oils, such as olive, canola, soybean, or sunflower oil. Meal planning   Eat a balanced diet that includes: ? 5 or more servings of fruits and vegetables each day. At each meal, try to fill half of your plate with fruits and vegetables. ? Up to 6-8 servings of whole grains each day. ? Less than 6 oz of lean meat, poultry, or fish each day. A 3-oz serving of meat is about the same size as a deck of cards. One egg equals 1 oz. ? 2 servings of low-fat dairy each day. ? A serving of nuts, seeds, or beans 5 times each week. ? Heart-healthy fats. Healthy fats called Omega-3 fatty acids  are found in foods such as flaxseeds and coldwater fish, like sardines, salmon, and mackerel.  Limit how much you eat of the following: ? Canned or prepackaged foods. ? Food that is high in trans fat, such as fried foods. ? Food that is high in saturated fat, such as fatty meat. ? Sweets, desserts, sugary drinks, and other foods with added sugar. ? Full-fat dairy products.  Do not salt foods before eating.  Try to eat at least 2 vegetarian meals each week.  Eat more home-cooked food and less restaurant, buffet, and fast food.  When eating at a restaurant, ask that your food be prepared with less salt or no salt, if possible. What foods are recommended? The items listed may not be a complete list. Talk with your dietitian about what dietary choices are best for  you. Grains Whole-grain or whole-wheat bread. Whole-grain or whole-wheat pasta. Brown rice. Modena Morrow. Bulgur. Whole-grain and low-sodium cereals. Pita bread. Low-fat, low-sodium crackers. Whole-wheat flour tortillas. Vegetables Fresh or frozen vegetables (raw, steamed, roasted, or grilled). Low-sodium or reduced-sodium tomato and vegetable juice. Low-sodium or reduced-sodium tomato sauce and tomato paste. Low-sodium or reduced-sodium canned vegetables. Fruits All fresh, dried, or frozen fruit. Canned fruit in natural juice (without added sugar). Meat and other protein foods Skinless chicken or [place]. Ground chicken or [place]. Pork with fat trimmed off. Fish and seafood. Egg whites. Dried beans, peas, or lentils. Unsalted nuts, nut butters, and seeds. Unsalted canned beans. Lean cuts of beef with fat trimmed off. Low-sodium, lean deli meat. Dairy Low-fat (1%) or fat-free (skim) milk. Fat-free, low-fat, or reduced-fat cheeses. Nonfat, low-sodium ricotta or cottage cheese. Low-fat or nonfat yogurt. Low-fat, low-sodium cheese. Fats and oils Soft margarine without trans fats. Vegetable oil. Low-fat, reduced-fat, or light mayonnaise and salad dressings (reduced-sodium). Canola, safflower, olive, soybean, and sunflower oils. Avocado. Seasoning and other foods Herbs. Spices. Seasoning mixes without salt. Unsalted popcorn and pretzels. Fat-free sweets. What foods are not recommended? The items listed may not be a complete list. Talk with your dietitian about what dietary choices are best for you. Grains Baked goods made with fat, such as croissants, muffins, or some breads. Dry pasta or rice meal packs. Vegetables Creamed or fried vegetables. Vegetables in a cheese sauce. Regular canned vegetables (not low-sodium or reduced-sodium). Regular canned tomato sauce and paste (not low-sodium or reduced-sodium). Regular tomato and vegetable juice (not low-sodium or reduced-sodium). Angie Fava.  Olives. Fruits Canned fruit in a light or heavy syrup. Fried fruit. Fruit in cream or butter sauce. Meat and other protein foods Fatty cuts of meat. Ribs. Fried meat. Berniece Salines. Sausage. Bologna and other processed lunch meats. Salami. Fatback. Hotdogs. Bratwurst. Salted nuts and seeds. Canned beans with added salt. Canned or smoked fish. Whole eggs or egg yolks. Chicken or [place] with skin. Dairy Whole or 2% milk, cream, and half-and-half. Whole or full-fat cream cheese. Whole-fat or sweetened yogurt. Full-fat cheese. Nondairy creamers. Whipped toppings. Processed cheese and cheese spreads. Fats and oils Butter. Stick margarine. Lard. Shortening. Ghee. Bacon fat. Tropical oils, such as coconut, palm kernel, or palm oil. Seasoning and other foods Salted popcorn and pretzels. Onion salt, garlic salt, seasoned salt, table salt, and sea salt. Worcestershire sauce. Tartar sauce. Barbecue sauce. Teriyaki sauce. Soy sauce, including reduced-sodium. Steak sauce. Canned and packaged gravies. Fish sauce. Oyster sauce. Cocktail sauce. Horseradish that you find on the shelf. Ketchup. Mustard. Meat flavorings and tenderizers. Bouillon cubes. Hot sauce and Tabasco sauce. Premade or packaged marinades. Premade or packaged taco  seasonings. Relishes. Regular salad dressings. Where to find more information:  National Heart, Lung, and Blood Institute: PopSteam.is  American Heart Association: www.heart.org Summary  The DASH eating plan is a healthy eating plan that has been shown to reduce high blood pressure (hypertension). It may also reduce your risk for type 2 diabetes, heart disease, and stroke.  With the DASH eating plan, you should limit salt (sodium) intake to 2,300 mg a day. If you have hypertension, you may need to reduce your sodium intake to 1,500 mg a day.  When on the DASH eating plan, aim to eat more fresh fruits and vegetables, whole grains, lean proteins, low-fat dairy, and heart-healthy  fats.  Work with your health care provider or diet and nutrition specialist (dietitian) to adjust your eating plan to your individual calorie needs. This information is not intended to replace advice given to you by your health care provider. Make sure you discuss any questions you have with your health care provider. Document Released: 01/10/2011 Document Revised: 01/15/2016 Document Reviewed: 01/15/2016 Elsevier Interactive Patient Education  2017 [employer].

## 2016-12-23 NOTE — Progress Notes (Signed)
Subjective:    Patient ID: Gene Reed, male    DOB: 09-09-1970, 46 y.o.   MRN: [id number]  HPI Marnee SpringDerick Boxwell, a 46 year old male with a history of hypertension and gout presents for a 1 month follow up of hypertension. He was started on Amlodipine 5 mg 1 month ago and has been taking medication consistently.  He is not exercising and is not adherent to low salt diet.  He does not check blood pressure at home.  Patient denies chest pain, chest pressure/discomfort, dyspnea, fatigue, lower extremity edema, orthopnea, palpitations, syncope and tachypnea.  Cardiovascular risk factors: obesity (BMI >= 30 kg/m2), sedentary lifestyle and smoking/ tobacco exposure.  Patient is also following up on gout. He was evaluated for a gout attack 1 month ago and uric acid was found to be elevated. Patient was started on Allopurinol 100 mg twice daily. He says that he has not had another gout attack. Gout attack typically occurs in the ankles. He says that he has been avoiding high purine foods, but has continued to drink alohol. He is not have gout pain at present.   Past Medical History:  Diagnosis Date  . Ankle joint pain   . Arthritis   . Bone spur   . Hypertension   . Knee pain    Social History   Socioeconomic History  . Marital status: Single    Spouse name: Not on file  . Number of children: Not on file  . Years of education: Not on file  . Highest education level: Not on file  Social Needs  . Financial resource strain: Not on file  . Food insecurity - worry: Not on file  . Food insecurity - inability: Not on file  . Transportation needs - medical: Not on file  . Transportation needs - non-medical: Not on file  Occupational History  . Not on file  Tobacco Use  . Smoking status: Former Games developermoker  . Smokeless tobacco: Never Used  Substance and Sexual Activity  . Alcohol use: Yes    Comment: daily -2- 40's   . Drug use: Yes    Types: Marijuana    Comment: every other day  . Sexual  activity: Not on file  Other Topics Concern  . Not on file  Social History Narrative  . Not on file   There is no immunization history on file for this patient.   Review of Systems  Constitutional: Negative.  Negative for fatigue and fever.  HENT: Negative.   Eyes: Negative.   Respiratory: Negative.   Cardiovascular: Negative.   Gastrointestinal: Negative.   Endocrine: Negative.  Negative for polydipsia, polyphagia and polyuria.  Genitourinary: Negative.   Musculoskeletal: Negative.   Skin: Negative.   Allergic/Immunologic: Negative.   Neurological: Negative.   Hematological: Negative.   Psychiatric/Behavioral: Negative.        Objective:   Physical Exam  Constitutional: He is oriented to person, place, and time. He appears well-developed and well-nourished.  HENT:  Head: Normocephalic and atraumatic.  Right Ear: External ear normal.  Left Ear: External ear normal.  Nose: Nose normal.  Mouth/Throat: Oropharynx is clear and moist.  Eyes: Conjunctivae and EOM are normal. Pupils are equal, round, and reactive to light.  Neck: Normal range of motion. Neck supple.  Cardiovascular: Normal rate, regular rhythm, normal heart sounds and intact distal pulses.  Pulmonary/Chest: Effort normal and breath sounds normal.  Abdominal: Soft. Bowel sounds are normal.  Musculoskeletal: Normal range of motion.  Neurological:  He is alert and oriented to person, place, and time. He has normal reflexes.  Skin: Skin is warm and dry.  Psychiatric: He has a normal mood and affect. His behavior is normal. Judgment and thought content normal.      BP (!) 140/92 Comment: MANUALLY  Pulse 83   Temp 98.3 F (36.8 C) (Oral)   Resp 16   Ht 5\' 9"  (1.753 m)   Wt 263 lb (119.3 kg)   SpO2 98%   BMI 38.84 kg/m  Assessment & Plan:  1. Essential hypertension Blood pressure is at goal on current medication regimen.  Will continue amlodipine 5 mg daily.- Continue medication, monitor blood pressure at  home. Continue DASH diet. Reminder to go to the ER if any CP, SOB, nausea, dizziness, severe HA, changes vision/speech, left arm numbness and tingling and jaw pain.  2. Gout of ankle, unspecified cause, unspecified chronicity, unspecified laterality The nature of gout is fully explained, including dietary relationship, acute and interval phase and treatment of both. Long term complications such as kidney stones, tophi and arthritis are discussed. Avoidance of alcohol recommended, and written literature is given along with a low purine diet. Indications for the use of allopurinol for prophylaxis to prevent or treat flare-ups is also discussed.  Call if further attacks occur, or this one does not resolve promptly.  - Uric Acid  RTC: 3 months for hypertension   Nolon NationsLaChina Moore Syriana Croslin  MSN, FNP-C [facility] [address]  [place], [place] [id number] [phone]

## 2016-12-24 LAB — URIC ACID: URIC ACID, SERUM: 6.7 mg/dL (ref 4.0–8.0)

## 2017-03-26 ENCOUNTER — Ambulatory Visit: Payer: Self-pay | Admitting: Family Medicine

## 2017-04-24 ENCOUNTER — Other Ambulatory Visit: Payer: Self-pay

## 2017-04-24 DIAGNOSIS — M10279 Drug-induced gout, unspecified ankle and foot: Secondary | ICD-10-CM

## 2017-04-24 MED ORDER — ALLOPURINOL 100 MG PO TABS: 100.0000 mg | ORAL_TABLET | Freq: Every day | ORAL | 5 refills | Status: DC

## 2017-06-11 ENCOUNTER — Other Ambulatory Visit: Payer: Self-pay

## 2017-06-11 ENCOUNTER — Emergency Department (HOSPITAL_COMMUNITY): Payer: Self-pay

## 2017-06-11 ENCOUNTER — Encounter (HOSPITAL_COMMUNITY): Payer: Self-pay | Admitting: Emergency Medicine

## 2017-06-11 ENCOUNTER — Emergency Department (HOSPITAL_COMMUNITY)
Admission: EM | Admit: 2017-06-11 | Discharge: 2017-06-11 | Disposition: A | Discharge status: Home / Self Care | Payer: Self-pay | Attending: Emergency Medicine | Admitting: Emergency Medicine

## 2017-06-11 DIAGNOSIS — Z87891 Personal history of nicotine dependence: Secondary | ICD-10-CM | POA: Insufficient documentation

## 2017-06-11 DIAGNOSIS — M25561 Pain in right knee: Secondary | ICD-10-CM

## 2017-06-11 DIAGNOSIS — Y998 Other external cause status: Secondary | ICD-10-CM | POA: Insufficient documentation

## 2017-06-11 DIAGNOSIS — Z79899 Other long term (current) drug therapy: Secondary | ICD-10-CM | POA: Insufficient documentation

## 2017-06-11 DIAGNOSIS — Y92512 Supermarket, store or market as the place of occurrence of the external cause: Secondary | ICD-10-CM | POA: Insufficient documentation

## 2017-06-11 DIAGNOSIS — M79631 Pain in right forearm: Secondary | ICD-10-CM

## 2017-06-11 DIAGNOSIS — S50811A Abrasion of right forearm, initial encounter: Secondary | ICD-10-CM | POA: Insufficient documentation

## 2017-06-11 DIAGNOSIS — I10 Essential (primary) hypertension: Secondary | ICD-10-CM | POA: Insufficient documentation

## 2017-06-11 DIAGNOSIS — Y9389 Activity, other specified: Secondary | ICD-10-CM | POA: Insufficient documentation

## 2017-06-11 DIAGNOSIS — T148XXA Other injury of unspecified body region, initial encounter: Secondary | ICD-10-CM

## 2017-06-11 DIAGNOSIS — S0990XA Unspecified injury of head, initial encounter: Secondary | ICD-10-CM | POA: Insufficient documentation

## 2017-06-11 DIAGNOSIS — S80211A Abrasion, right knee, initial encounter: Secondary | ICD-10-CM | POA: Insufficient documentation

## 2017-06-11 NOTE — Discharge Instructions (Addendum)
Alternate 600 mg of ibuprofen and [phone] mg of Tylenol every 3 hours as needed for pain. Do not exceed 4000 mg of Tylenol daily.  Apply ice or heat to areas of soreness, which ever feels best.  Take some hot showers or hot baths to avoid muscle stiffness.  Do some gentle stretching throughout the day.  Apply antibiotic ointment to your skin abrasions once or twice daily.  Follow-up with your primary care physician for reevaluation.  Return to the emergency department immediately for any concerning signs or symptoms develop such as fevers, abnormal drainage, weakness, severe headaches, or passing out.
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
## 2017-11-03 ENCOUNTER — Other Ambulatory Visit: Payer: Self-pay | Admitting: Family Medicine

## 2017-11-03 DIAGNOSIS — M10279 Drug-induced gout, unspecified ankle and foot: Secondary | ICD-10-CM

## 2017-11-04 ENCOUNTER — Other Ambulatory Visit: Payer: Self-pay | Admitting: Family Medicine

## 2017-11-04 DIAGNOSIS — M10279 Drug-induced gout, unspecified ankle and foot: Secondary | ICD-10-CM
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
## 2018-09-14 ENCOUNTER — Encounter: Payer: Self-pay | Admitting: Family Medicine

## 2018-09-14 ENCOUNTER — Ambulatory Visit (INDEPENDENT_AMBULATORY_CARE_PROVIDER_SITE_OTHER): Payer: Self-pay | Admitting: Family Medicine

## 2018-09-14 ENCOUNTER — Other Ambulatory Visit: Payer: Self-pay

## 2018-09-14 VITALS — BP 132/86 | HR 78 | Temp 98.9°F | Resp 16 | Ht 69.0 in | Wt 249.0 lb

## 2018-09-14 DIAGNOSIS — Z7289 Other problems related to lifestyle: Secondary | ICD-10-CM

## 2018-09-14 DIAGNOSIS — Z789 Other specified health status: Secondary | ICD-10-CM

## 2018-09-14 DIAGNOSIS — I10 Essential (primary) hypertension: Secondary | ICD-10-CM

## 2018-09-14 DIAGNOSIS — K219 Gastro-esophageal reflux disease without esophagitis: Secondary | ICD-10-CM

## 2018-09-14 LAB — POCT URINALYSIS DIPSTICK
Bilirubin, UA: NEGATIVE
Glucose, UA: NEGATIVE
Ketones, UA: NEGATIVE
Leukocytes, UA: NEGATIVE
Nitrite, UA: NEGATIVE
Protein, UA: POSITIVE — AB
Spec Grav, UA: 1.025 (ref 1.010–1.025)
Urobilinogen, UA: 0.2 E.U./dL
pH, UA: 5.5 (ref 5.0–8.0)

## 2018-09-14 MED ORDER — AMLODIPINE BESYLATE 5 MG PO TABS: 5.0000 mg | ORAL_TABLET | Freq: Every day | ORAL | 3 refills | Status: DC

## 2018-09-14 NOTE — Progress Notes (Signed)
Patient [facility]  New Patient Encounter Provider: Lanae Boast, [place]    KDT:[id number]  DXI:[id number]  DOB - 1970/12/22  SUBJECTIVE:   Gene Reed, is a 48 y.o. male who presents to re-establish care with this clinic. His last office visit was in 2018. He reports not having his BP medicaiton for over a year. He was seen at [facility] in the ED due to headache and started back on lisinopril 5 mg per day. Patient states that he is having a dry cough since starting lisinopril. He was previously on amolodipine 5mg .  Patient states that he is smoking one joint of marijuana and drinks 12 beers a day. Patient states that he takes prilosec intermittently that is due to eating late at night and laying down.   No Known Allergies Past Medical History:  Diagnosis Date  . Ankle joint pain   . Arthritis   . Bone spur   . Hypertension   . Knee pain    No current outpatient medications on file prior to visit.   No current facility-administered medications on file prior to visit.    History reviewed. No pertinent family history. Social History   Socioeconomic History  . Marital status: Single    Spouse name: Not on file  . Number of children: Not on file  . Years of education: Not on file  . Highest education level: Not on file  Occupational History  . Not on file  Social Needs  . Financial resource strain: Not on file  . Food insecurity    Worry: Not on file    Inability: Not on file  . Transportation needs    Medical: Not on file    Non-medical: Not on file  Tobacco Use  . Smoking status: Former Research scientist (life sciences)  . Smokeless tobacco: Never Used  Substance and Sexual Activity  . Alcohol use: Yes    Comment: 12 pack a day   . Drug use: Yes    Types: Marijuana    Comment: 1 joint a day   . Sexual activity: Not on file  Lifestyle  . Physical activity    Days per week: Not on file    Minutes per session: Not on file  . Stress: Not on file   Relationships  . Social Herbalist on phone: Not on file    Gets together: Not on file    Attends religious service: Not on file    Active member of club or organization: Not on file    Attends meetings of clubs or organizations: Not on file    Relationship status: Not on file  . Intimate partner violence    Fear of current or ex partner: Not on file    Emotionally abused: Not on file    Physically abused: Not on file    Forced sexual activity: Not on file  Other Topics Concern  . Not on file  Social History Narrative  . Not on file    Review of Systems  Constitutional: Negative.   HENT: Negative.   Eyes: Negative.   Respiratory: Negative.   Cardiovascular: Negative.   Gastrointestinal: Negative.   Genitourinary: Negative.   Musculoskeletal: Negative.   Skin: Negative.   Neurological: Negative.   Psychiatric/Behavioral: Negative.      OBJECTIVE:    BP 132/86 (BP Location: Left Arm, Patient Position: Sitting, Cuff Size: Large)   Pulse 78   Temp 98.9 F (37.2 C) (Oral)  Resp 16   Ht 5\' 9"  (1.753 m)   Wt 249 lb (112.9 kg)   SpO2 99%   BMI 36.77 kg/m   Physical Exam  Constitutional: He is oriented to person, place, and time and well-developed, well-nourished, and in no distress. No distress.  HENT:  Head: Normocephalic and atraumatic.  Eyes: Pupils are equal, round, and reactive to light. Conjunctivae and EOM are normal.  Neck: Normal range of motion. Neck supple.  Cardiovascular: Normal rate, regular rhythm and intact distal pulses. Exam reveals no gallop and no friction rub.  No murmur heard. Pulmonary/Chest: Effort normal and breath sounds normal. No respiratory distress. He has no wheezes.  Abdominal: Soft. Bowel sounds are normal. There is no abdominal tenderness.  Musculoskeletal: Normal range of motion.        General: No tenderness or edema.  Lymphadenopathy:    He has no cervical adenopathy.  Neurological: He is alert and oriented to  person, place, and time. Gait normal.  Skin: Skin is warm and dry.  Psychiatric: Mood, memory, affect and judgment normal.  Nursing note and vitals reviewed.    ASSESSMENT/PLAN:  1. Essential hypertension Changed from lisinopril to amlodipine due to cough.  - Urinalysis Dipstick - amLODipine (NORVASC) 5 MG tablet; Take 1 tablet (5 mg total) by mouth daily.  Dispense: 90 tablet; Refill: 3 - Comprehensive metabolic panel; Future - Lipid Panel With LDL/HDL Ratio; Future - TSH; Future  2. Gastroesophageal reflux disease, esophagitis presence not specified Discussed not eating late at night prior to laying down. Continue with otc PPI  3. Alcohol use Patient states that he will cut back on the alcohol. He is not interested in rehab at the present time.   Return in about 3 months (around 12/15/2018), or 2 weeks BP check and fasting labs., for htn.  The patient was given clear instructions to go to ER or return to medical center if symptoms don't improve, worsen or new problems develop. The patient verbalized understanding. The patient was told to call to get lab results if they haven't heard anything in the next week.     This note has been created with Education officer, environmentalDragon speech recognition software and smart phrase technology. Any transcriptional errors are unintentional.   Ms. Andr L. Riley Lamouglas, FNP-BC [facility] [address]  [place], [place] [id number] [phone]

## 2018-09-14 NOTE — Patient Instructions (Signed)
Hypertension, Adult Hypertension is another name for high blood pressure. High blood pressure forces your heart to work harder to pump blood. This can cause problems over time. There are two numbers in a blood pressure reading. There is a top number (systolic) over a bottom number (diastolic). It is best to have a blood pressure that is below 120/80. Healthy choices can help lower your blood pressure, or you may need medicine to help lower it. What are the causes? The cause of this condition is not known. Some conditions may be related to high blood pressure. What increases the risk?  Smoking.  Having type 2 diabetes mellitus, high cholesterol, or both.  Not getting enough exercise or physical activity.  Being overweight.  Having too much fat, sugar, calories, or salt (sodium) in your diet.  Drinking too much alcohol.  Having long-term (chronic) kidney disease.  Having a family history of high blood pressure.  Age. Risk increases with age.  Race. You may be at higher risk if you are African American.  Gender. Men are at higher risk than women before age 45. After age 65, women are at higher risk than men.  Having obstructive sleep apnea.  Stress. What are the signs or symptoms?  High blood pressure may not cause symptoms. Very high blood pressure (hypertensive crisis) may cause: ? Headache. ? Feelings of worry or nervousness (anxiety). ? Shortness of breath. ? Nosebleed. ? A feeling of being sick to your stomach (nausea). ? Throwing up (vomiting). ? Changes in how you see. ? Very bad chest pain. ? Seizures. How is this treated?  This condition is treated by making healthy lifestyle changes, such as: ? Eating healthy foods. ? Exercising more. ? Drinking less alcohol.  Your health care provider may prescribe medicine if lifestyle changes are not enough to get your blood pressure under control, and if: ? Your top number is above 130. ? Your bottom number is above 80.   Your personal target blood pressure may vary. Follow these instructions at home: Eating and drinking   If told, follow the DASH eating plan. To follow this plan: ? Fill one half of your plate at each meal with fruits and vegetables. ? Fill one fourth of your plate at each meal with whole grains. Whole grains include whole-wheat pasta, brown rice, and whole-grain bread. ? Eat or drink low-fat dairy products, such as skim milk or low-fat yogurt. ? Fill one fourth of your plate at each meal with low-fat (lean) proteins. Low-fat proteins include fish, chicken without skin, eggs, beans, and tofu. ? Avoid fatty meat, cured and processed meat, or chicken with skin. ? Avoid pre-made or processed food.  Eat less than 1,500 mg of salt each day.  Do not drink alcohol if: ? Your doctor tells you not to drink. ? You are pregnant, may be pregnant, or are planning to become pregnant.  If you drink alcohol: ? Limit how much you use to:  0-1 drink a day for women.  0-2 drinks a day for men. ? Be aware of how much alcohol is in your drink. In the [place] one drink equals one 12 oz bottle of beer (355 mL), one 5 oz glass of wine (148 mL), or one 1 oz glass of hard liquor (44 mL). Lifestyle   Work with your doctor to stay at a healthy weight or to lose weight. Ask your doctor what the best weight is for you.  Get at least 30 minutes of exercise most   days of the week. This may include walking, swimming, or biking.  Get at least 30 minutes of exercise that strengthens your muscles (resistance exercise) at least 3 days a week. This may include lifting weights or doing Pilates.  Do not use any products that contain nicotine or tobacco, such as cigarettes, e-cigarettes, and chewing tobacco. If you need help quitting, ask your doctor.  Check your blood pressure at home as told by your doctor.  Keep all follow-up visits as told by your doctor. This is important. Medicines  Take over-the-counter and  prescription medicines only as told by your doctor. Follow directions carefully.  Do not skip doses of blood pressure medicine. The medicine does not work as well if you skip doses. Skipping doses also puts you at risk for problems.  Ask your doctor about side effects or reactions to medicines that you should watch for. Contact a doctor if you:  Think you are having a reaction to the medicine you are taking.  Have headaches that keep coming back (recurring).  Feel dizzy.  Have swelling in your ankles.  Have trouble with your vision. Get help right away if you:  Get a very bad headache.  Start to feel mixed up (confused).  Feel weak or numb.  Feel faint.  Have very bad pain in your: ? Chest. ? Belly (abdomen).  Throw up more than once.  Have trouble breathing. Summary  Hypertension is another name for high blood pressure.  High blood pressure forces your heart to work harder to pump blood.  For most people, a normal blood pressure is less than 120/80.  Making healthy choices can help lower blood pressure. If your blood pressure does not get lower with healthy choices, you may need to take medicine. This information is not intended to replace advice given to you by your health care provider. Make sure you discuss any questions you have with your health care provider. Document Released: 07/10/2007 Document Revised: 10/01/2017 Document Reviewed: 10/01/2017 Elsevier Patient Education  2020 [employer] Health Maintenance, Male Adopting a healthy lifestyle and getting preventive care are important in promoting health and wellness. Ask your health care provider about:  The right schedule for you to have regular tests and exams.  Things you can do on your own to prevent diseases and keep yourself healthy. What should I know about diet, weight, and exercise? Eat a healthy diet   Eat a diet that includes plenty of vegetables, fruits, low-fat dairy products, and lean  protein.  Do not eat a lot of foods that are high in solid fats, added sugars, or sodium. Maintain a healthy weight Body mass index (BMI) is a measurement that can be used to identify possible weight problems. It estimates body fat based on height and weight. Your health care provider can help determine your BMI and help you achieve or maintain a healthy weight. Get regular exercise Get regular exercise. This is one of the most important things you can do for your health. Most adults should:  Exercise for at least 150 minutes each week. The exercise should increase your heart rate and make you sweat (moderate-intensity exercise).  Do strengthening exercises at least twice a week. This is in addition to the moderate-intensity exercise.  Spend less time sitting. Even light physical activity can be beneficial. Watch cholesterol and blood lipids Have your blood tested for lipids and cholesterol at 47 years of age, then have this test every 5 years. You may need to have your  cholesterol levels checked more often if:  Your lipid or cholesterol levels are high.  You are older than 47 years of age.  You are at high risk for heart disease. What should I know about cancer screening? Many types of cancers can be detected early and may often be prevented. Depending on your health history and family history, you may need to have cancer screening at various ages. This may include screening for:  Colorectal cancer.  Prostate cancer.  Skin cancer.  Lung cancer. What should I know about heart disease, diabetes, and high blood pressure? Blood pressure and heart disease  High blood pressure causes heart disease and increases the risk of stroke. This is more likely to develop in people who have high blood pressure readings, are of African descent, or are overweight.  Talk with your health care provider about your target blood pressure readings.  Have your blood pressure checked: ? Every 3-5 years  if you are 4518-47 years of age. ? Every year if you are 47 years old or older.  If you are between the ages of 3565 and 8475 and are a current or former smoker, ask your health care provider if you should have a one-time screening for abdominal aortic aneurysm (AAA). Diabetes Have regular diabetes screenings. This checks your fasting blood sugar level. Have the screening done:  Once every three years after age 945 if you are at a normal weight and have a low risk for diabetes.  More often and at a younger age if you are overweight or have a high risk for diabetes. What should I know about preventing infection? Hepatitis B If you have a higher risk for hepatitis B, you should be screened for this virus. Talk with your health care provider to find out if you are at risk for hepatitis B infection. Hepatitis C Blood testing is recommended for:  Everyone born from [id number] through 1964.  Anyone with known risk factors for hepatitis C. Sexually transmitted infections (STIs)  You should be screened each year for STIs, including gonorrhea and chlamydia, if: ? You are sexually active and are younger than 47 years of age. ? You are older than 47 years of age and your health care provider tells you that you are at risk for this type of infection. ? Your sexual activity has changed since you were last screened, and you are at increased risk for chlamydia or gonorrhea. Ask your health care provider if you are at risk.  Ask your health care provider about whether you are at high risk for HIV. Your health care provider may recommend a prescription medicine to help prevent HIV infection. If you choose to take medicine to prevent HIV, you should first get tested for HIV. You should then be tested every 3 months for as long as you are taking the medicine. Follow these instructions at home: Lifestyle  Do not use any products that contain nicotine or tobacco, such as cigarettes, e-cigarettes, and chewing tobacco. If  you need help quitting, ask your health care provider.  Do not use street drugs.  Do not share needles.  Ask your health care provider for help if you need support or information about quitting drugs. Alcohol use  Do not drink alcohol if your health care provider tells you not to drink.  If you drink alcohol: ? Limit how much you have to 0-2 drinks a day. ? Be aware of how much alcohol is in your drink. In the [place] one drink  equals one 12 oz bottle of beer (355 mL), one 5 oz glass of wine (148 mL), or one 1 oz glass of hard liquor (44 mL). General instructions  Schedule regular health, dental, and eye exams.  Stay current with your vaccines.  Tell your health care provider if: ? You often feel depressed. ? You have ever been abused or do not feel safe at home. Summary  Adopting a healthy lifestyle and getting preventive care are important in promoting health and wellness.  Follow your health care provider's instructions about healthy diet, exercising, and getting tested or screened for diseases.  Follow your health care provider's instructions on monitoring your cholesterol and blood pressure. This information is not intended to replace advice given to you by your health care provider. Make sure you discuss any questions you have with your health care provider. Document Released: 07/20/2007 Document Revised: 01/14/2018 Document Reviewed: 01/14/2018 Elsevier Patient Education  2020 [employer].

## 2018-09-22 ENCOUNTER — Ambulatory Visit: Payer: Self-pay | Admitting: Family Medicine

## 2018-09-25 ENCOUNTER — Other Ambulatory Visit: Payer: Self-pay

## 2018-09-25 DIAGNOSIS — Z20822 Contact with and (suspected) exposure to covid-19: Secondary | ICD-10-CM

## 2018-09-26 LAB — NOVEL CORONAVIRUS, NAA: SARS-CoV-2, NAA: NOT DETECTED

## 2018-09-28 ENCOUNTER — Other Ambulatory Visit: Payer: Self-pay

## 2018-09-28 ENCOUNTER — Ambulatory Visit: Payer: Self-pay

## 2018-09-28 VITALS — BP 140/82

## 2018-09-28 DIAGNOSIS — I10 Essential (primary) hypertension: Secondary | ICD-10-CM

## 2018-10-14 ENCOUNTER — Encounter (HOSPITAL_COMMUNITY): Payer: Self-pay

## 2018-10-14 ENCOUNTER — Encounter (HOSPITAL_COMMUNITY): Payer: Self-pay | Admitting: *Deleted

## 2018-11-24 ENCOUNTER — Emergency Department (HOSPITAL_BASED_OUTPATIENT_CLINIC_OR_DEPARTMENT_OTHER)
Admission: EM | Admit: 2018-11-24 | Discharge: 2018-11-24 | Disposition: A | Discharge status: Home / Self Care | Payer: Self-pay | Attending: Emergency Medicine | Admitting: Emergency Medicine

## 2018-11-24 ENCOUNTER — Encounter (HOSPITAL_BASED_OUTPATIENT_CLINIC_OR_DEPARTMENT_OTHER): Payer: Self-pay | Admitting: *Deleted

## 2018-11-24 ENCOUNTER — Other Ambulatory Visit: Payer: Self-pay

## 2018-11-24 DIAGNOSIS — M109 Gout, unspecified: Secondary | ICD-10-CM | POA: Insufficient documentation

## 2018-11-24 DIAGNOSIS — Z87891 Personal history of nicotine dependence: Secondary | ICD-10-CM | POA: Insufficient documentation

## 2018-11-24 DIAGNOSIS — I10 Essential (primary) hypertension: Secondary | ICD-10-CM | POA: Insufficient documentation

## 2018-11-24 DIAGNOSIS — Z79899 Other long term (current) drug therapy: Secondary | ICD-10-CM | POA: Insufficient documentation

## 2018-11-24 DIAGNOSIS — M10472 Other secondary gout, left ankle and foot: Secondary | ICD-10-CM

## 2018-11-24 MED ORDER — PREDNISONE 10 MG PO TABS
60.0000 mg | ORAL_TABLET | Freq: Once | ORAL | Status: AC
  Administered 2018-11-24: 60 mg via ORAL
  Filled 2018-11-24: qty 1

## 2018-11-24 MED ORDER — DICYCLOMINE HCL 10 MG PO CAPS
10.0000 mg | ORAL_CAPSULE | Freq: Once | ORAL | Status: AC
  Administered 2018-11-24: 10 mg via ORAL
  Filled 2018-11-24: qty 1

## 2018-11-24 MED ORDER — HYDROCODONE-ACETAMINOPHEN 5-325 MG PO TABS: 1.0000 | ORAL_TABLET | Freq: Four times a day (QID) | ORAL | 0 refills | Status: DC | PRN

## 2018-11-24 MED ORDER — OXYCODONE-ACETAMINOPHEN 5-325 MG PO TABS
1.0000 | ORAL_TABLET | Freq: Once | ORAL | Status: AC
  Administered 2018-11-24: 15:00:00 1 via ORAL
  Filled 2018-11-24: qty 1

## 2018-11-24 MED ORDER — PREDNISONE 10 MG PO TABS: 40.0000 mg | ORAL_TABLET | Freq: Every day | ORAL | 0 refills | Status: AC

## 2018-11-24 NOTE — ED Provider Notes (Signed)
[facility] EMERGENCY DEPARTMENT Provider Note   CSN: [id number] Arrival date & time: 11/24/18  1426     History   Chief Complaint Chief Complaint  Patient presents with  . Foot Pain    HPI Gene Reed is a 48 y.o. male with a past medical history of hypertension who presents to ED for evaluation of L foot pain, concern for gout flare up. States he ate red meat this weekend and believes that this was the cause of his flare up.  Reports history of gout in both of his feet in the past.  Usually improved with pain control and prednisone.  He denies injuries but has been walking with crutches since pain began 3 days ago.  Denies any fever, history of septic joint, trauma to the area, numbness in foot.  While evaluating the patient, he had sudden onset of spasm in his abdomen on the left side.  Slowly improved with positional changes.  Denies any vomiting, diarrhea, abdominal pain.     HPI  Past Medical History:  Diagnosis Date  . Ankle joint pain   . Arthritis   . Bone spur   . Hypertension   . Knee pain     Patient Active Problem List   Diagnosis Date Noted  . Essential hypertension 12/23/2016  . Gout of ankle 12/23/2016    Past Surgical History:  Procedure Laterality Date  . KNEE SURGERY          Home Medications    Prior to Admission medications   Medication Sig Start Date End Date Taking? Authorizing Provider  amLODipine (NORVASC) 5 MG tablet Take 1 tablet (5 mg total) by mouth daily. 09/14/18  Yes Mike Gipouglas, Andre, FNP  HYDROcodone-acetaminophen (NORCO/VICODIN) 5-325 MG tablet Take 1 tablet by mouth every 6 (six) hours as needed. 11/24/18   Tikisha Molinaro, PA-C  predniSONE (DELTASONE) 10 MG tablet Take 4 tablets (40 mg total) by mouth daily for 4 days. 11/24/18 11/28/18  Dietrich PatesKhatri, Alando Colleran, PA-C    Family History No family history on file.  Social History Social History   Tobacco Use  . Smoking status: Former Games developermoker  . Smokeless tobacco: Never Used   Substance Use Topics  . Alcohol use: Yes    Comment: 12 pack a day   . Drug use: Yes    Types: Marijuana    Comment: 1 joint a day      Allergies   Patient has no known allergies.   Review of Systems Review of Systems  Constitutional: Negative for chills and fever.  Gastrointestinal: Negative for nausea and vomiting.  Musculoskeletal: Positive for arthralgias.  Neurological: Negative for weakness and numbness.     Physical Exam Updated Vital Signs BP (!) 151/89 (BP Location: Left Arm)   Pulse (!) 111   Temp 99.2 F (37.3 C) (Oral)   Resp 20   Ht 5\' 9"  (1.753 m)   Wt 112 kg   SpO2 98%   BMI 36.48 kg/m   Physical Exam Vitals signs and nursing note reviewed.  Constitutional:      General: He is not in acute distress.    Appearance: He is well-developed. He is not diaphoretic.  HENT:     Head: Normocephalic and atraumatic.  Eyes:     General: No scleral icterus.    Conjunctiva/sclera: Conjunctivae normal.  Neck:     Musculoskeletal: Normal range of motion.  Pulmonary:     Effort: Pulmonary effort is normal. No respiratory distress.  Abdominal:  Palpations: Abdomen is soft.     Tenderness: There is no abdominal tenderness.  Musculoskeletal: Normal range of motion.        General: Swelling and tenderness present.     Comments: Tenderness to palpation of left foot diffusely with mild edema noted.  No overlying skin changes, erythema, change sensation.  2+ DP pulse palpated.  Able to move ankle and digits without difficulty.  Skin:    Findings: No rash.  Neurological:     Mental Status: He is alert.      ED Treatments / Results  Labs (all labs ordered are listed, but only abnormal results are displayed) Labs Reviewed - No data to display  EKG None  Radiology No results found.  Procedures Procedures (including critical care time)  Medications Ordered in ED Medications  dicyclomine (BENTYL) capsule 10 mg (10 mg Oral Given 11/24/18 1514)   oxyCODONE-acetaminophen (PERCOCET/ROXICET) 5-325 MG per tablet 1 tablet (1 tablet Oral Given 11/24/18 1517)  predniSONE (DELTASONE) tablet 60 mg (60 mg Oral Given 11/24/18 1515)     Initial Impression / Assessment and Plan / ED Course  I have reviewed the triage vital signs and the nursing notes.  Pertinent labs & imaging results that were available during my care of the patient were reviewed by me and considered in my medical decision making (see chart for details).        48 year old male with a past medical history of gout, hypertension presenting to the ED for concern for gout flareup of left foot.  Reports symptoms began 4 days ago.  Does admit to increased red meat consumption including steak and Lamb.  He has been walking on crutches to with the pain.  States that it feels similar to his prior gout flareups.  Denies any injury or fall, erythema or history of septic joint.  On exam there is tenderness palpation of the left foot and ankle as well as mild edema.  No overlying skin changes or warmth of joint noted.  Suspect that his tachycardia and hypertension could be due to his discomfort.  On my exam he also complained of a sharp spasming pain in his left abdomen while we were speaking.  This seemed to improve with Bentyl and positional changes.  He declines further work-up for this.  Will treat what appears to be gout flareup with steroids and pain medicine.  Doubt infectious or vascular cause of symptoms.  Warm follow-up with PCP and return for worsening symptoms.  Patient is hemodynamically stable, in NAD, and able to ambulate in the ED. Evaluation does not show pathology that would require ongoing emergent intervention or inpatient treatment. I explained the diagnosis to the patient. Pain has been managed and has no complaints prior to discharge. Patient is comfortable with above plan and is stable for discharge at this time. All questions were answered prior to disposition. Strict return  precautions for returning to the ED were discussed. Encouraged follow up with PCP.   An After Visit Summary was printed and given to the patient.   Portions of this note were generated with Scientist, clinical (histocompatibility and immunogenetics). Dictation errors may occur despite best attempts at proofreading.   Final Clinical Impressions(s) / ED Diagnoses   Final diagnoses:  Other secondary acute gout of left foot    ED Discharge Orders         Ordered    predniSONE (DELTASONE) 10 MG tablet  Daily     11/24/18 1521    HYDROcodone-acetaminophen (NORCO/VICODIN) 5-325  MG tablet  Every 6 hours PRN     11/24/18 1530           Delia Heady, PA-C 11/24/18 1532    Maudie Flakes, MD 11/27/18 [phone]

## 2018-11-24 NOTE — ED Triage Notes (Signed)
Left foot pain x 4 days. No known injury.

## 2018-11-24 NOTE — Discharge Instructions (Signed)
Take medications as directed. Return to the ED for worsening symptoms, redness or increased swelling of the foot or joints, injuries or falls, numbness.

## 2018-12-06 DIAGNOSIS — R350 Frequency of micturition: Secondary | ICD-10-CM

## 2018-12-06 HISTORY — DX: Frequency of micturition: R35.0

## 2018-12-16 ENCOUNTER — Other Ambulatory Visit: Payer: Self-pay

## 2018-12-16 ENCOUNTER — Ambulatory Visit: Payer: Self-pay | Admitting: Family Medicine

## 2018-12-16 ENCOUNTER — Encounter: Payer: Self-pay | Admitting: Family Medicine

## 2018-12-16 ENCOUNTER — Ambulatory Visit (INDEPENDENT_AMBULATORY_CARE_PROVIDER_SITE_OTHER): Payer: Self-pay | Admitting: Family Medicine

## 2018-12-16 VITALS — BP 151/85 | HR 96

## 2018-12-16 DIAGNOSIS — M109 Gout, unspecified: Secondary | ICD-10-CM

## 2018-12-16 DIAGNOSIS — Z09 Encounter for follow-up examination after completed treatment for conditions other than malignant neoplasm: Secondary | ICD-10-CM

## 2018-12-16 DIAGNOSIS — I10 Essential (primary) hypertension: Secondary | ICD-10-CM

## 2018-12-16 DIAGNOSIS — R350 Frequency of micturition: Secondary | ICD-10-CM

## 2018-12-16 DIAGNOSIS — Z0000 Encounter for general adult medical examination without abnormal findings: Secondary | ICD-10-CM

## 2018-12-16 LAB — POCT URINALYSIS DIPSTICK
Bilirubin, UA: NEGATIVE
Glucose, UA: NEGATIVE
Ketones, UA: NEGATIVE
Leukocytes, UA: NEGATIVE
Nitrite, UA: NEGATIVE
Protein, UA: POSITIVE — AB
Spec Grav, UA: 1.025 (ref 1.010–1.025)
Urobilinogen, UA: 0.2 E.U./dL
pH, UA: 5.5 (ref 5.0–8.0)

## 2018-12-16 LAB — GLUCOSE, POCT (MANUAL RESULT ENTRY): POC Glucose: 96 mg/dl (ref 70–99)

## 2018-12-16 LAB — POCT GLYCOSYLATED HEMOGLOBIN (HGB A1C)
HbA1c POC (<> result, manual entry): 4.9 % (ref 4.0–5.6)
Hemoglobin A1C: 4 % (ref 4.0–5.6)

## 2018-12-16 MED ORDER — ALLOPURINOL 100 MG PO TABS: 100.0000 mg | ORAL_TABLET | Freq: Every day | ORAL | 6 refills | Status: DC

## 2018-12-16 MED ORDER — COLCHICINE 0.6 MG PO TABS: 0.6000 mg | ORAL_TABLET | Freq: Three times a day (TID) | ORAL | 6 refills | Status: DC | PRN

## 2018-12-16 NOTE — Progress Notes (Signed)
[facility]    Hospital Follow Up  Subjective:  Patient ID: Gene Reed, male    DOB: Oct 14, 1970  Age: 48 y.o. MRN: [id number]  CC:  Chief Complaint  Patient presents with  . Follow-up    est care - Former [name] patient  . Foot Pain    Both feet- rt foot & ankle swelling, increased piain with activiity     HPI Gene Reed is a 48 year old male who presents for Follow Up today.   Past Medical History:  Diagnosis Date  . Ankle joint pain   . Arthritis   . Bilateral chronic knee pain   . Bone spur   . Gout   . Hypertension   . Knee pain   . Urinary frequency 12/2018   Current Status: This will be Mr. Treat initial office visit with me. He was previously seeing Mike Gip, NP for his PCP needs. Since his last office visit, he has had several ED visits, last visit on 11/24/2018 for Gout. He has increased pain in his right foot today. He had c./o urinary frequency. She denies urinary frequency, discharge, dysuria, urinary itching, burning, odor, hematuria, and suprapubic pain/discomfort. His knee pain is better. He has been taking Motrin and Acetaminophen as needed. He states that has not taken his blood pressure medication this morning. He denies visual changes, chest pain, cough, shortness of breath, heart palpitations, and falls. He has occasional headaches and dizziness with position changes. Denies severe headaches, confusion, seizures, double vision, and blurred vision, nausea and vomiting. He denies fevers, chills, fatigue, recent infections, weight loss, and night sweats.  No reports of GI problems such as diarrhea, and constipation. He has no reports of blood in stools. No depression or anxiety reported.   Past Surgical History:  Procedure Laterality Date  . KNEE SURGERY      History reviewed. No pertinent family history.  Social History   Socioeconomic History  . Marital status: Single    Spouse name: Not  on file  . Number of children: Not on file  . Years of education: Not on file  . Highest education level: Not on file  Occupational History  . Not on file  Social Needs  . Financial resource strain: Not on file  . Food insecurity    Worry: Not on file    Inability: Not on file  . Transportation needs    Medical: Not on file    Non-medical: Not on file  Tobacco Use  . Smoking status: Former Games developer  . Smokeless tobacco: Never Used  Substance and Sexual Activity  . Alcohol use: Yes    Comment:  6 beers per a day   . Drug use: Yes    Types: Marijuana    Comment: 1 joint a day   . Sexual activity: Not Currently  Lifestyle  . Physical activity    Days per week: Not on file    Minutes per session: Not on file  . Stress: Not on file  Relationships  . Social Musician on phone: Not on file    Gets together: Not on file    Attends religious service: Not on file    Active member of club or organization: Not on file    Attends meetings of clubs or organizations: Not on file    Relationship status: Not on file  . Intimate partner violence    Fear of  current or ex partner: Not on file    Emotionally abused: Not on file    Physically abused: Not on file    Forced sexual activity: Not on file  Other Topics Concern  . Not on file  Social History Narrative  . Not on file    Outpatient Medications Prior to Visit  Medication Sig Dispense Refill  . amLODipine (NORVASC) 5 MG tablet Take 1 tablet (5 mg total) by mouth daily. 90 tablet 3  . HYDROcodone-acetaminophen (NORCO/VICODIN) 5-325 MG tablet Take 1 tablet by mouth every 6 (six) hours as needed. 5 tablet 0   No facility-administered medications prior to visit.     No Known Allergies  ROS Review of Systems  Constitutional: Negative.   HENT: Negative.   Eyes: Negative.   Respiratory: Negative.   Cardiovascular: Negative.   Gastrointestinal: Negative.   Endocrine: Negative.   Genitourinary: Positive for  frequency.  Musculoskeletal: Positive for arthralgias (bilateral knee pain; generalized joint pain).  Skin: Negative.   Allergic/Immunologic: Negative.   Neurological: Positive for dizziness (occasional ) and headaches (occasional ).  Hematological: Negative.   Psychiatric/Behavioral: Negative.       Objective:    Physical Exam  Constitutional: He is oriented to person, place, and time. He appears well-developed and well-nourished.  HENT:  Head: Normocephalic and atraumatic.  Eyes: Conjunctivae are normal.  Neck: Normal range of motion. Neck supple.  Cardiovascular: Normal rate, regular rhythm, normal heart sounds and intact distal pulses.  Pulmonary/Chest: Effort normal and breath sounds normal.  Abdominal: Soft. Bowel sounds are normal. He exhibits distension (obese).  Musculoskeletal:        General: Tenderness, deformity and edema present.       Feet:     Comments: Chronic bilateral knee pain  Neurological: He is alert and oriented to person, place, and time. He has normal reflexes.  Skin: Skin is warm and dry.  Psychiatric: He has a normal mood and affect. His behavior is normal. Judgment and thought content normal.  Nursing note and vitals reviewed.   BP (!) 151/85   Pulse 96   SpO2 97%  Wt Readings from Last 3 Encounters:  11/24/18 247 lb (112 kg)  09/14/18 249 lb (112.9 kg)  06/11/17 250 lb (113.4 kg)     There are no preventive care reminders to display for this patient.  There are no preventive care reminders to display for this patient.  Lab Results  Component Value Date   TSH 1.33 11/20/2016   Lab Results  Component Value Date   WBC 12.3 (H) 11/20/2016   HGB 13.3 11/20/2016   HCT 38.0 (L) 11/20/2016   MCV 90.5 11/20/2016   PLT 267 11/20/2016   Lab Results  Component Value Date   NA 138 11/20/2016   K 4.2 11/20/2016   CO2 22 11/20/2016   GLUCOSE 108 (H) 11/20/2016   BUN 13 11/20/2016   CREATININE 0.66 11/20/2016   BILITOT 1.0 11/20/2016    ALKPHOS 103 09/04/2015   AST 27 11/20/2016   ALT 22 11/20/2016   PROT 7.6 11/20/2016   ALBUMIN 4.4 09/04/2015   CALCIUM 9.0 11/20/2016   ANIONGAP 8 09/04/2015   No results found for: CHOL No results found for: HDL No results found for: LDLCALC No results found for: TRIG No results found for: [facility] Lab Results  Component Value Date   HGBA1C 4.0 12/16/2018   HGBA1C 4.9 12/16/2018      Assessment & Plan:   1. Hospital discharge follow-up  2. Acute gout of foot, unspecified cause, unspecified laterality We will initiate medications for maintenance and acute control of gouty episodes.  - Uric Acid - allopurinol (ZYLOPRIM) 100 MG tablet; Take 1 tablet (100 mg total) by mouth daily.  Dispense: 30 tablet; Refill: 6 - colchicine 0.6 MG tablet; Take 1 tablet (0.6 mg total) by mouth every 8 (eight) hours as needed. For acute gouty attacks.  Dispense: 30 tablet; Refill: 6  3. Essential hypertension The current medical regimen is effective; blood pressure is mildly elevated, but stable at 151/85 today, probable r/t increased pain; continue present plan and medications as prescribed. He will continue to take medications as prescribed, to decrease high sodium intake, excessive alcohol intake, increase potassium intake, smoking cessation, and increase physical activity of at least 30 minutes of cardio activity daily. He will continue to follow Heart Healthy or DASH diet. - POCT HgB A1C - Urinalysis Dipstick - Glucose (CBG)  4. Urinary frequency Results are pending.  - PSA  5. Health care maintenance - POCT HgB A1C - Urinalysis Dipstick - Glucose (CBG)  6. Follow up He will follow up in 3 months.   Meds ordered this encounter  Medications  . allopurinol (ZYLOPRIM) 100 MG tablet    Sig: Take 1 tablet (100 mg total) by mouth daily.    Dispense:  30 tablet    Refill:  6  . colchicine 0.6 MG tablet    Sig: Take 1 tablet (0.6 mg total) by mouth every 8 (eight) hours as needed. For  acute gouty attacks.    Dispense:  30 tablet    Refill:  6    Orders Placed This Encounter  Procedures  . PSA  . Uric Acid  . POCT HgB A1C  . Urinalysis Dipstick  . Glucose (CBG)    Referral Orders  No referral(s) requested today    Raliegh IpNatalie Savahanna Almendariz,  MSN, FNP-BC [facility] [address]  [place], [place] [id number] [phone]   Problem List Items Addressed This Visit      Cardiovascular and Mediastinum   Essential hypertension   Relevant Orders   POCT HgB A1C (Completed)   Urinalysis Dipstick (Completed)   Glucose (CBG) (Completed)    Other Visit Diagnoses    Hospital discharge follow-up    -  Primary   Acute gout of foot, unspecified cause, unspecified laterality       Relevant Medications   allopurinol (ZYLOPRIM) 100 MG tablet   colchicine 0.6 MG tablet   Other Relevant Orders   Uric Acid   Urinary frequency       Relevant Orders   PSA   Health care maintenance       Relevant Orders   POCT HgB A1C (Completed)   Urinalysis Dipstick (Completed)   Glucose (CBG) (Completed)      Meds ordered this encounter  Medications  . allopurinol (ZYLOPRIM) 100 MG tablet    Sig: Take 1 tablet (100 mg total) by mouth daily.    Dispense:  30 tablet    Refill:  6  . colchicine 0.6 MG tablet    Sig: Take 1 tablet (0.6 mg total) by mouth every 8 (eight) hours as needed. For acute gouty attacks.    Dispense:  30 tablet    Refill:  6    Follow-up: Return in about 3 months (around 03/18/2019).    Kallie LocksNatalie M Burnetta Kohls, FNP

## 2018-12-16 NOTE — Patient Instructions (Signed)
Uric Acid Nephropathy  Uric acid is a chemical compound that is made when your body digests some kinds of food and also when your body breaks down dead cells. It is a waste product that is normally removed from your body by your kidneys. If you have too much uric acid in your blood, it can build up in your kidneys and cause damage (nephropathy). There are two types of uric acid nephropathy:  Sudden (acute) uric acid nephropathy results from a sudden buildup of uric acid.  Long-term (chronic) uric acid nephropathy results from a slow buildup of uric acid over a long period of time. What are the causes? The exact cause of this condition may depend on the type of uric acid nephropathy:  Acute uric acid nephropathy may be caused by: ? Receiving medicines for the treatment of cancer (chemotherapy). Use of these medicines causes rapid breakdown of cells. The cell breakdown produces excess uric acid. As uric acid builds up in your kidneys, it causes an increase of pressure and a loss of blood supply. This makes your kidneys less able to filter blood and make urine. ? A tumor (cancer) in the body. ? Seizures. ? Taking medicines that can cause excess uric acid. Examples are aspirin, water pills (diuretics), and medicines that are prescribed after an organ transplant. ? Severe diarrhea, which causes fluid loss (dehydration).  Chronic uric acid nephropathy may happen if you have high levels of uric acid in your body on a regular basis. One reason you may have high levels of uric acid is gout. With gout, excess uric acid forms into crystals. These crystals can get stuck inside joints and cause painful swelling. They may also build up in your kidneys and cause long-term damage. What increases the risk? You may be more likely to develop this condition if you:  Are male.  Are 47 years old or older.  Have gout.  Eat a lot of foods that are high in certain natural chemical compounds (purines). Shellfish  and red meat contain a lot of purines.  Drink alcohol.  Have recently had heart surgery. What are the signs or symptoms? Signs and symptoms depend on the type of nephropathy that you have. They may include:  Decreased urine output.  Nausea and vomiting.  Lack of energy.  Seizures.  Blood-tinged urine.  Pain when passing urine.  Pain in the sides of the lower back (flank pain). In some cases, there are no symptoms. How is this diagnosed? Your health care provider may suspect uric acid nephropathy from your signs and symptoms, especially if you have gout. He or she may:  Do a physical exam.  Order tests to confirm the diagnosis. Tests may include: ? Blood and urine tests. This is the best way to measure high levels of uric acid. ? Imaging studies to check for kidney stones or kidney damage. These may include:  X-rays.  Ultrasound.  CT scan.  MRI. How is this treated? The goal of treatment is to lower the level of uric acid in your body and prevent kidney damage. This can be done by:  Taking medicines that block the production of uric acid. The most commonly used medicine is allopurinol. If you are starting chemotherapy, ask your health care provider if you should start taking a medicine to prevent high uric acid.  Starting a diet plan that lowers your intake of purines. Work with a diet and nutrition specialist (dietitian) to limit your intake of foods and drinks that increase  uric acid.  Preventing uric acid buildup. Drink plenty of water to maintain a good flow of urine and to lower the acidity of your urine. You may also need to take a medicine called bicarbonate.  Resting the kidneys. This can be done by using a machine to clean your blood (hemodialysis), if necessary. In hemodialysis, your blood is removed, passed through a filtering machine, and then returned to your body. Several sessions of hemodialysis usually improve kidney function by removing uric  acid. Follow these instructions at home: Eating and drinking      Drink enough fluid to keep your urine pale yellow.  Do not drink alcohol.  Do not drink beverages that contain a type of sugar called fructose.  Limit how much red meat and shellfish you eat.  Include plenty of low-fat dairy foods in your diet. General instructions  Take over-the-counter and prescription medicines only as told by your health care provider.  Maintain a healthy weight. Lose weight as directed by your health care provider.  Keep all follow-up visits as told by your health care provider. This is important. Contact a health care provider if you:  Feel tired and have low energy, even when you get enough sleep.  Have pain when passing urine.  Have nausea or vomiting. Get help right away if you:  Produce very little urine, even when you drink enough fluids.  Have blood in your urine.  Have a seizure. Summary  Uric acid is a waste product that is normally removed from your body by your kidneys. If you have too much uric acid in your blood, it can build up in your kidneys and cause damage (nephropathy).  Sudden (acute) uric acid nephropathy results from a sudden buildup of uric acid. Long-term (chronic) uric acid nephropathy results from a slow buildup of uric acid over a long period of time. This may happen if you have gout.  The goal of treatment is to lower the level of uric acid in your body and prevent kidney damage. This information is not intended to replace advice given to you by your health care provider. Make sure you discuss any questions you have with your health care provider. Document Released: 11/18/2006 Document Revised: 05/12/2018 Document Reviewed: 02/11/2017 Elsevier Patient Education  2020 [employer] Low-Purine Eating Plan A low-purine eating plan involves making food choices to limit your intake of purine. Purine is a kind of uric acid. Too much uric acid in your blood can  cause certain conditions, such as gout and kidney stones. Eating a low-purine diet can help control these conditions. What are tips for following this plan? Reading food labels   Avoid foods with saturated or Trans fat.  Check the ingredient list of grains-based foods, such as bread and cereal, to make sure that they contain whole grains.  Check the ingredient list of sauces or soups to make sure they do not contain meat or fish.  When choosing soft drinks, check the ingredient list to make sure they do not contain high-fructose corn syrup. Shopping  Buy plenty of fresh fruits and vegetables.  Avoid buying canned or fresh fish.  Buy dairy products labeled as low-fat or nonfat.  Avoid buying premade or processed foods. These foods are often high in fat, salt (sodium), and added sugar. Cooking  Use olive oil instead of butter when cooking. Oils like olive oil, canola oil, and sunflower oil contain healthy fats. Meal planning  Learn which foods do or do not affect you. If  you find out that a food tends to cause your gout symptoms to flare up, avoid eating that food. You can enjoy foods that do not cause problems. If you have any questions about a food item, talk with your dietitian or health care provider.  Limit foods high in fat, especially saturated fat. Fat makes it harder for your body to get rid of uric acid.  Choose foods that are lower in fat and are lean sources of protein. General guidelines  Limit alcohol intake to no more than 1 drink a day for nonpregnant women and 2 drinks a day for men. One drink equals 12 oz of beer, 5 oz of wine, or 1 oz of hard liquor. Alcohol can affect the way your body gets rid of uric acid.  Drink plenty of water to keep your urine clear or pale yellow. Fluids can help remove uric acid from your body.  If directed by your health care provider, take a vitamin C supplement.  Work with your health care provider and dietitian to develop a plan  to achieve or maintain a healthy weight. Losing weight can help reduce uric acid in your blood. What foods are recommended? The items listed may not be a complete list. Talk with your dietitian about what dietary choices are best for you. Foods low in purines Foods low in purines do not need to be limited. These include:  All fruits.  All low-purine vegetables, pickles, and olives.  Breads, pasta, rice, cornbread, and popcorn. Cake and other baked goods.  All dairy foods.  Eggs, nuts, and nut butters.  Spices and condiments, such as salt, herbs, and vinegar.  Plant oils, butter, and margarine.  Water, sugar-free soft drinks, tea, coffee, and cocoa.  Vegetable-based soups, broths, sauces, and gravies. Foods moderate in purines Foods moderate in purines should be limited to the amounts listed.   cup of asparagus, cauliflower, spinach, mushrooms, or green peas, each day.  2/3 cup uncooked oatmeal, each day.   cup dry wheat bran or wheat germ, each day.  2-3 ounces of meat or poultry, each day.  4-6 ounces of shellfish, such as crab, lobster, oysters, or shrimp, each day.  1 cup cooked beans, peas, or lentils, each day.  Soup, broths, or bouillon made from meat or fish. Limit these foods as much as possible. What foods are not recommended? The items listed may not be a complete list. Talk with your dietitian about what dietary choices are best for you. Limit your intake of foods high in purines, including:  Beer and other alcohol.  Meat-based gravy or sauce.  Canned or fresh fish, such as: ? Anchovies, sardines, herring, and tuna. ? Mussels and scallops. ? Codfish, trout, and haddock.  Berniece Salines.  Organ meats, such as: ? Liver or kidney. ? Tripe. ? Sweetbreads (thymus gland or pancreas).  [employer].  Yeast or yeast extract supplements.  Drinks sweetened with high-fructose corn syrup. Summary  Eating a low-purine diet can help control conditions  caused by too much uric acid in the body, such as gout or kidney stones.  Choose low-purine foods, limit alcohol, and limit foods high in fat.  You will learn over time which foods do or do not affect you. If you find out that a food tends to cause your gout symptoms to flare up, avoid eating that food. This information is not intended to replace advice given to you by your health care provider. Make sure you discuss any questions you  have with your health care provider. Document Released: 05/18/2010 Document Revised: 01/03/2017 Document Reviewed: 03/06/2016 Elsevier Patient Education  2020 [employer] Gout  Gout is painful swelling of your joints. Gout is a type of arthritis. It is caused by having too much uric acid in your body. Uric acid is a chemical that is made when your body breaks down substances called purines. If your body has too much uric acid, sharp crystals can form and build up in your joints. This causes pain and swelling. Gout attacks can happen quickly and be very painful (acute gout). Over time, the attacks can affect more joints and happen more often (chronic gout). What are the causes?  Too much uric acid in your blood. This can happen because: ? Your kidneys do not remove enough uric acid from your blood. ? Your body makes too much uric acid. ? You eat too many foods that are high in purines. These foods include organ meats, some seafood, and beer.  Trauma or stress. What increases the risk?  Having a family history of gout.  Being male and middle-aged.  Being male and having gone through menopause.  Being very overweight (obese).  Drinking alcohol, especially beer.  Not having enough water in the body (being dehydrated).  Losing weight too quickly.  Having an organ transplant.  Having lead poisoning.  Taking certain medicines.  Having kidney disease.  Having a skin condition called psoriasis. What are the signs or symptoms? An attack of acute  gout usually happens in just one joint. The most common place is the big toe. Attacks often start at night. Other joints that may be affected include joints of the feet, ankle, knee, fingers, wrist, or elbow. Symptoms of an attack may include:  Very bad pain.  Warmth.  Swelling.  Stiffness.  Shiny, red, or purple skin.  Tenderness. The affected joint may be very painful to touch.  Chills and fever. Chronic gout may cause symptoms more often. More joints may be involved. You may also have white or yellow lumps (tophi) on your hands or feet or in other areas near your joints. How is this treated?  Treatment for this condition has two phases: treating an acute attack and preventing future attacks.  Acute gout treatment may include: ? NSAIDs. ? Steroids. These are taken by mouth or injected into a joint. ? Colchicine. This medicine relieves pain and swelling. It can be given by mouth or through an IV tube.  Preventive treatment may include: ? Taking small doses of NSAIDs or colchicine daily. ? Using a medicine that reduces uric acid levels in your blood. ? Making changes to your diet. You may need to see a food expert (dietitian) about what to eat and drink to prevent gout. Follow these instructions at home: During a gout attack   If told, put ice on the painful area: ? Put ice in a plastic bag. ? Place a towel between your skin and the bag. ? Leave the ice on for 20 minutes, 2-3 times a day.  Raise (elevate) the painful joint above the level of your heart as often as you can.  Rest the joint as much as possible. If the joint is in your leg, you may be given crutches.  Follow instructions from your doctor about what you cannot eat or drink. Avoiding future gout attacks  Eat a low-purine diet. Avoid foods and drinks such as: ? Liver. ? Kidney. ? Anchovies. ? Asparagus. ? Herring. ? Mushrooms. ? Mussels. ?  Beer.  Stay at a healthy weight. If you want to lose weight,  talk with your doctor. Do not lose weight too fast.  Start or continue an exercise plan as told by your doctor. Eating and drinking  Drink enough fluids to keep your pee (urine) pale yellow.  If you drink alcohol: ? Limit how much you use to:  0-1 drink a day for women.  0-2 drinks a day for men. ? Be aware of how much alcohol is in your drink. In the [place] one drink equals one 12 oz bottle of beer (355 mL), one 5 oz glass of wine (148 mL), or one 1 oz glass of hard liquor (44 mL). General instructions  Take over-the-counter and prescription medicines only as told by your doctor.  Do not drive or use heavy machinery while taking prescription pain medicine.  Return to your normal activities as told by your doctor. Ask your doctor what activities are safe for you.  Keep all follow-up visits as told by your doctor. This is important. Contact a doctor if:  You have another gout attack.  You still have symptoms of a gout attack after 10 days of treatment.  You have problems (side effects) because of your medicines.  You have chills or a fever.  You have burning pain when you pee (urinate).  You have pain in your lower back or belly. Get help right away if:  You have very bad pain.  Your pain cannot be controlled.  You cannot pee. Summary  Gout is painful swelling of the joints.  The most common site of pain is the big toe, but it can affect other joints.  Medicines and avoiding some foods can help to prevent and treat gout attacks. This information is not intended to replace advice given to you by your health care provider. Make sure you discuss any questions you have with your health care provider. Document Released: 10/31/2007 Document Revised: 08/13/2017 Document Reviewed: 08/13/2017 Elsevier Patient Education  2020 [employer].

## 2018-12-17 ENCOUNTER — Other Ambulatory Visit: Payer: Self-pay

## 2018-12-17 DIAGNOSIS — R350 Frequency of micturition: Secondary | ICD-10-CM | POA: Insufficient documentation

## 2018-12-17 LAB — COMPREHENSIVE METABOLIC PANEL
ALT: 44 IU/L (ref 0–44)
AST: 62 IU/L — ABNORMAL HIGH (ref 0–40)
Albumin/Globulin Ratio: 1.2 (ref 1.2–2.2)
Albumin: 4.4 g/dL (ref 4.0–5.0)
Alkaline Phosphatase: 132 IU/L — ABNORMAL HIGH (ref 39–117)
BUN/Creatinine Ratio: 15 (ref 9–20)
BUN: 10 mg/dL (ref 6–24)
Bilirubin Total: 0.5 mg/dL (ref 0.0–1.2)
CO2: 20 mmol/L (ref 20–29)
Calcium: 9.1 mg/dL (ref 8.7–10.2)
Chloride: 102 mmol/L (ref 96–106)
Creatinine, Ser: 0.68 mg/dL — ABNORMAL LOW (ref 0.76–1.27)
GFR calc Af Amer: 131 mL/min/{1.73_m2} (ref >59)
GFR calc non Af Amer: 114 mL/min/{1.73_m2} (ref >59)
Globulin, Total: 3.7 g/dL (ref 1.5–4.5)
Glucose: 103 mg/dL — ABNORMAL HIGH (ref 65–99)
Potassium: 4.3 mmol/L (ref 3.5–5.2)
Sodium: 138 mmol/L (ref 134–144)
Total Protein: 8.1 g/dL (ref 6.0–8.5)

## 2018-12-17 LAB — LIPID PANEL WITH LDL/HDL RATIO
Cholesterol, Total: 239 mg/dL — ABNORMAL HIGH (ref 100–199)
HDL: 46 mg/dL (ref >39)
LDL Chol Calc (NIH): 168 mg/dL — ABNORMAL HIGH (ref 0–99)
LDL/HDL Ratio: 3.7 ratio — ABNORMAL HIGH (ref 0.0–3.6)
Triglycerides: 140 mg/dL (ref 0–149)
VLDL Cholesterol Cal: 25 mg/dL (ref 5–40)

## 2018-12-17 LAB — TSH: TSH: 2.08 u[IU]/mL (ref 0.450–4.500)

## 2018-12-18 ENCOUNTER — Other Ambulatory Visit: Payer: Self-pay

## 2018-12-18 ENCOUNTER — Telehealth: Payer: Self-pay

## 2018-12-18 DIAGNOSIS — I10 Essential (primary) hypertension: Secondary | ICD-10-CM

## 2018-12-18 LAB — URIC ACID: Uric Acid: 4.8 mg/dL (ref 3.7–8.6)

## 2018-12-18 LAB — SPECIMEN STATUS REPORT

## 2018-12-18 LAB — PSA: Prostate Specific Ag, Serum: 0.5 ng/mL (ref 0.0–4.0)

## 2018-12-18 MED ORDER — AMLODIPINE BESYLATE 5 MG PO TABS: 5.0000 mg | ORAL_TABLET | Freq: Every day | ORAL | 3 refills | Status: DC

## 2018-12-18 NOTE — Telephone Encounter (Signed)
-----   Message from Azzie Glatter, FNP sent at 12/18/2018  3:11 PM EST ----- Positive for Proteinuria. We will continue to monitor.   All other labs are stable. Keep follow up appointment. Please inform patient. Thank you.

## 2018-12-18 NOTE — Telephone Encounter (Signed)
Called, spoke with patient, advised that all labs are stable and to keep next appointment. Thanks!

## 2019-01-06 ENCOUNTER — Ambulatory Visit: Payer: Self-pay | Admitting: Family Medicine

## 2019-02-17 ENCOUNTER — Ambulatory Visit (INDEPENDENT_AMBULATORY_CARE_PROVIDER_SITE_OTHER): Payer: Self-pay | Admitting: Nurse Practitioner

## 2019-02-17 ENCOUNTER — Encounter: Payer: Self-pay | Admitting: Nurse Practitioner

## 2019-02-17 ENCOUNTER — Other Ambulatory Visit: Payer: Self-pay

## 2019-02-17 DIAGNOSIS — M109 Gout, unspecified: Secondary | ICD-10-CM

## 2019-02-17 NOTE — Patient Instructions (Addendum)
Low-Purine Eating Plan A low-purine eating plan involves making food choices to limit your intake of purine. Purine is a kind of uric acid. Too much uric acid in your blood can cause certain conditions, such as gout and kidney stones. Eating a low-purine diet can help control these conditions. What are tips for following this plan? Reading food labels   Avoid foods with saturated or Trans fat.  Check the ingredient list of grains-based foods, such as bread and cereal, to make sure that they contain whole grains.  Check the ingredient list of sauces or soups to make sure they do not contain meat or fish.  When choosing soft drinks, check the ingredient list to make sure they do not contain high-fructose corn syrup. Shopping  Buy plenty of fresh fruits and vegetables.  Avoid buying canned or fresh fish.  Buy dairy products labeled as low-fat or nonfat.  Avoid buying premade or processed foods. These foods are often high in fat, salt (sodium), and added sugar. Cooking  Use olive oil instead of butter when cooking. Oils like olive oil, canola oil, and sunflower oil contain healthy fats. Meal planning  Learn which foods do or do not affect you. If you find out that a food tends to cause your gout symptoms to flare up, avoid eating that food. You can enjoy foods that do not cause problems. If you have any questions about a food item, talk with your dietitian or health care provider.  Limit foods high in fat, especially saturated fat. Fat makes it harder for your body to get rid of uric acid.  Choose foods that are lower in fat and are lean sources of protein. General guidelines  Limit alcohol intake to no more than 1 drink a day for nonpregnant women and 2 drinks a day for men. One drink equals 12 oz of beer, 5 oz of wine, or 1 oz of hard liquor. Alcohol can affect the way your body gets rid of uric acid.  Drink plenty of water to keep your urine clear or pale yellow. Fluids can help  remove uric acid from your body.  If directed by your health care provider, take a vitamin C supplement.  Work with your health care provider and dietitian to develop a plan to achieve or maintain a healthy weight. Losing weight can help reduce uric acid in your blood. What foods are recommended? The items listed may not be a complete list. Talk with your dietitian about what dietary choices are best for you. Foods low in purines Foods low in purines do not need to be limited. These include:  All fruits.  All low-purine vegetables, pickles, and olives.  Breads, pasta, rice, cornbread, and popcorn. Cake and other baked goods.  All dairy foods.  Eggs, nuts, and nut butters.  Spices and condiments, such as salt, herbs, and vinegar.  Plant oils, butter, and margarine.  Water, sugar-free soft drinks, tea, coffee, and cocoa.  Vegetable-based soups, broths, sauces, and gravies. Foods moderate in purines Foods moderate in purines should be limited to the amounts listed.   cup of asparagus, cauliflower, spinach, mushrooms, or green peas, each day.  2/3 cup uncooked oatmeal, each day.   cup dry wheat bran or wheat germ, each day.  2-3 ounces of meat or poultry, each day.  4-6 ounces of shellfish, such as crab, lobster, oysters, or shrimp, each day.  1 cup cooked beans, peas, or lentils, each day.  Soup, broths, or bouillon made from meat or   fish. Limit these foods as much as possible. What foods are not recommended? The items listed may not be a complete list. Talk with your dietitian about what dietary choices are best for you. Limit your intake of foods high in purines, including:  Beer and other alcohol.  Meat-based gravy or sauce.  Canned or fresh fish, such as: ? Anchovies, sardines, herring, and tuna. ? Mussels and scallops. ? Codfish, trout, and haddock.  Tomasa Blase.  Organ meats, such as: ? Liver or kidney. ? Tripe. ? Sweetbreads (thymus gland or  pancreas).  Wild Education officer, environmental.  Yeast or yeast extract supplements.  Drinks sweetened with high-fructose corn syrup. Summary  Eating a low-purine diet can help control conditions caused by too much uric acid in the body, such as gout or kidney stones.  Choose low-purine foods, limit alcohol, and limit foods high in fat.  You will learn over time which foods do or do not affect you. If you find out that a food tends to cause your gout symptoms to flare up, avoid eating that food. This information is not intended to replace advice given to you by your health care provider. Make sure you discuss any questions you have with your health care provider. Document Revised: 01/03/2017 Document Reviewed: 03/06/2016 Elsevier Patient Education  2020 [employer] Inc[employer]  High Cholesterol  High cholesterol is a condition in which the blood has high levels of a white, waxy, fat-like substance (cholesterol). The human body needs small amounts of cholesterol. The liver makes all the cholesterol that the body needs. Extra (excess) cholesterol comes from the food that we eat. Cholesterol is carried from the liver by the blood through the blood vessels. If you have high cholesterol, deposits (plaques) may build up on the walls of your blood vessels (arteries). Plaques make the arteries narrower and stiffer. Cholesterol plaques increase your risk for heart attack and stroke. Work with your health care provider to keep your cholesterol levels in a healthy range. What increases the risk? This condition is more likely to develop in people who:  Eat foods that are high in animal fat (saturated fat) or cholesterol.  Are overweight.  Are not getting enough exercise.  Have a family history of high cholesterol. What are the signs or symptoms? There are no symptoms of this condition. How is this diagnosed? This condition may be diagnosed from the results of a blood test.  If you are older than age 27, your health  care provider may check your cholesterol every 4-6 years.  You may be checked more often if you already have high cholesterol or other risk factors for heart disease. The blood test for cholesterol measures:  "Bad" cholesterol (LDL cholesterol). This is the main type of cholesterol that causes heart disease. The desired level for LDL is less than 100.  "Good" cholesterol (HDL cholesterol). This type helps to protect against heart disease by cleaning the arteries and carrying the LDL away. The desired level for HDL is 60 or higher.  Triglycerides. These are fats that the body can store or burn for energy. The desired number for triglycerides is lower than 150.  Total cholesterol. This is a measure of the total amount of cholesterol in your blood, including LDL cholesterol, HDL cholesterol, and triglycerides. A healthy number is less than 200. How is this treated? This condition is treated with diet changes, lifestyle changes, and medicines. Diet changes  This may include eating more whole grains, fruits, vegetables, nuts, and fish.  This  may also include cutting back on red meat and foods that have a lot of added sugar. Lifestyle changes  Changes may include getting at least 40 minutes of aerobic exercise 3 times a week. Aerobic exercises include walking, biking, and swimming. Aerobic exercise along with a healthy diet can help you maintain a healthy weight.  Changes may also include quitting smoking. Medicines  Medicines are usually given if diet and lifestyle changes have failed to reduce your cholesterol to healthy levels.  Your health care provider may prescribe a statin medicine. Statin medicines have been shown to reduce cholesterol, which can reduce the risk of heart disease. Follow these instructions at home: Eating and drinking If told by your health care provider:  Eat chicken (without skin), fish, veal, shellfish, ground [place] breast, and round or loin cuts of red  meat.  Do not eat fried foods or fatty meats, such as hot dogs and salami.  Eat plenty of fruits, such as apples.  Eat plenty of vegetables, such as broccoli, potatoes, and carrots.  Eat beans, peas, and lentils.  Eat grains such as barley, rice, couscous, and bulgur wheat.  Eat pasta without cream sauces.  Use skim or nonfat milk, and eat low-fat or nonfat yogurt and cheeses.  Do not eat or drink whole milk, cream, ice cream, egg yolks, or hard cheeses.  Do not eat stick margarine or tub margarines that contain trans fats (also called partially hydrogenated oils).  Do not eat saturated tropical oils, such as coconut oil and palm oil.  Do not eat cakes, cookies, crackers, or other baked goods that contain trans fats.  General instructions  Exercise as directed by your health care provider. Increase your activity level with activities such as gardening, walking, and taking the stairs.  Take over-the-counter and prescription medicines only as told by your health care provider.  Do not use any products that contain nicotine or tobacco, such as cigarettes and e-cigarettes. If you need help quitting, ask your health care provider.  Keep all follow-up visits as told by your health care provider. This is important. Contact a health care provider if:  You are struggling to maintain a healthy diet or weight.  You need help to start on an exercise program.  You need help to stop smoking. Get help right away if:  You have chest pain.  You have trouble breathing. This information is not intended to replace advice given to you by your health care provider. Make sure you discuss any questions you have with your health care provider. Document Revised: 01/24/2017 Document Reviewed: 07/22/2015 Elsevier Patient Education  [employer]. ____________________________________________________________________________________________

## 2019-02-17 NOTE — Progress Notes (Signed)
Established Patient Office Visit  Subjective:  Patient ID: Gene Reed, male    DOB: 1970-05-23  Age: 49 y.o. MRN: [id number]  CC:  Chief Complaint  Patient presents with  . Gout    possible gout attack in right foot    HPI Gene Reed presents for gout.  He has been having problems with gout for 2 years. He has been in increased right ankle pain and swelling for the last month while using allopurinol and colchicine.  He admits that he was told that if some days the pain is worse he could double up on his medication.  He has been doing this and his pain has not improved.  His pain is worse in his right ankle it radiates down to the top and sides of his foot.  He also has some pain that radiates up into his right knee.  He has purchased new shoes with ankle support.  He has also tried ankle brace.  He admits that this was effective but not making the pain go away. He does not feel like it was related to his diet . He stopped the red meats, bolonge and shrimp. He admits that it has just seemed to flare up.  The pain and swelling comes and goes.  He works fulltime at [employer].  He has had to miss some days out of work.  He has been out of medication for approximately 1 week.  He has been eating ibuprofen like "tick tacs".  He denies any recent accidents or injuries.  Past Medical History:  Diagnosis Date  . Ankle joint pain   . Arthritis   . Bilateral chronic knee pain   . Bone spur   . Gout   . Hypertension   . Knee pain   . Urinary frequency 12/2018    Past Surgical History:  Procedure Laterality Date  . KNEE SURGERY left     History reviewed. No pertinent family history.  Social History   Socioeconomic History  . Marital status: Single    Spouse name: Not on file  . Number of children: Not on file  . Years of education: Not on file  . Highest education level: Not on file  Occupational History  . Not on file  Tobacco Use  . Smoking status: Former Games developer  .  Smokeless tobacco: Never Used  Substance and Sexual Activity  . Alcohol use: Yes    Comment:  6 beers per a day   . Drug use: Yes    Types: Marijuana    Comment: 1 joint a day   . Sexual activity: Not Currently  Other Topics Concern  . Not on file  Social History Narrative  . Not on file   Social Determinants of Health   Financial Resource Strain:   . Difficulty of Paying Living Expenses: Not on file  Food Insecurity:   . Worried About Programme researcher, broadcasting/film/video in the Last Year: Not on file  . Ran Out of Food in the Last Year: Not on file  Transportation Needs:   . Lack of Transportation (Medical): Not on file  . Lack of Transportation (Non-Medical): Not on file  Physical Activity:   . Days of Exercise per Week: Not on file  . Minutes of Exercise per Session: Not on file  Stress:   . Feeling of Stress : Not on file  Social Connections:   . Frequency of Communication with Friends and Family: Not on file  .  Frequency of Social Gatherings with Friends and Family: Not on file  . Attends Religious Services: Not on file  . Active Member of Clubs or Organizations: Not on file  . Attends Archivist Meetings: Not on file  . Marital Status: Not on file  Intimate Partner Violence:   . Fear of Current or Ex-Partner: Not on file  . Emotionally Abused: Not on file  . Physically Abused: Not on file  . Sexually Abused: Not on file    Outpatient Medications Prior to Visit  Medication Sig Dispense Refill  . allopurinol (ZYLOPRIM) 100 MG tablet Take 1 tablet (100 mg total) by mouth daily. 30 tablet 6  . amLODipine (NORVASC) 5 MG tablet Take 1 tablet (5 mg total) by mouth daily. 90 tablet 3  . colchicine 0.6 MG tablet Take 1 tablet (0.6 mg total) by mouth every 8 (eight) hours as needed. For acute gouty attacks. 30 tablet 6  . HYDROcodone-acetaminophen (NORCO/VICODIN) 5-325 MG tablet Take 1 tablet by mouth every 6 (six) hours as needed. (Patient not taking: Reported on 02/17/2019) 5  tablet 0   No facility-administered medications prior to visit.    No Known Allergies  ROS Review of Systems  Constitutional: Negative.   HENT: Negative.   Eyes: Negative.   Respiratory: Negative.   Cardiovascular: Negative.   Gastrointestinal: Negative.   Genitourinary: Negative.   Musculoskeletal: Positive for arthralgias and joint swelling.  Skin: Positive for color change.  Allergic/Immunologic: Negative.   Neurological: Negative.   Hematological: Negative.   Psychiatric/Behavioral: Negative.       Objective:    Physical Exam  Constitutional: He is oriented to person, place, and time. He appears well-developed and well-nourished.  HENT:  Head: Normocephalic and atraumatic.  Cardiovascular: Normal rate and regular rhythm.  Pulmonary/Chest: Effort normal.  Musculoskeletal:        General: Tenderness and edema present.     Cervical back: Normal range of motion.     Comments: Right ankle and lateral foot  Neurological: He is alert and oriented to person, place, and time.  Skin: Skin is warm and dry. There is erythema.  Psychiatric: He has a normal mood and affect. His behavior is normal. Judgment and thought content normal.    BP (!) 145/83 (BP Location: Left Arm, Patient Position: Sitting, Cuff Size: Large)   Pulse 90   Temp 99.1 F (37.3 C) (Oral)   Resp 18   Ht 5\' 9"  (1.753 m)   Wt 262 lb (118.8 kg)   SpO2 100%   BMI 38.69 kg/m  Wt Readings from Last 3 Encounters:  02/17/19 262 lb (118.8 kg)  11/24/18 247 lb (112 kg)  09/14/18 249 lb (112.9 kg)     There are no preventive care reminders to display for this patient.  There are no preventive care reminders to display for this patient.  Lab Results  Component Value Date   TSH 2.080 12/16/2018   Lab Results  Component Value Date   WBC 12.3 (H) 11/20/2016   HGB 13.3 11/20/2016   HCT 38.0 (L) 11/20/2016   MCV 90.5 11/20/2016   PLT 267 11/20/2016   Lab Results  Component Value Date   NA 138  12/16/2018   K 4.3 12/16/2018   CO2 20 12/16/2018   GLUCOSE 103 (H) 12/16/2018   BUN 10 12/16/2018   CREATININE 0.68 (L) 12/16/2018   BILITOT 0.5 12/16/2018   ALKPHOS 132 (H) 12/16/2018   AST 62 (H) 12/16/2018   ALT 44  12/16/2018   PROT 8.1 12/16/2018   ALBUMIN 4.4 12/16/2018   CALCIUM 9.1 12/16/2018   ANIONGAP 8 09/04/2015   Lab Results  Component Value Date   CHOL 239 (H) 12/16/2018   Lab Results  Component Value Date   HDL 46 12/16/2018   Lab Results  Component Value Date   LDLCALC 168 (H) 12/16/2018   Lab Results  Component Value Date   TRIG 140 12/16/2018   No results found for: [facility] Lab Results  Component Value Date   HGBA1C 4.0 12/16/2018   HGBA1C 4.9 12/16/2018      Assessment & Plan:   Problem List Items Addressed This Visit      High   Acute gout of foot    Refill on allopurinol and colchicine.  Education provided for patient on the use of medication and the overuse which can affect the efficacy.      Relevant Orders   Uric Acid   Sedimentation rate   CRP   Comprehensive metabolic panel      No orders of the defined types were placed in this encounter.   Follow-up: Return in about 3 months (around 05/18/2019) for follow up.    Barbette Merino, NP

## 2019-02-17 NOTE — Assessment & Plan Note (Signed)
Refill on allopurinol and colchicine.  Education provided for patient on the use of medication and the overuse which can affect the efficacy.

## 2019-02-18 ENCOUNTER — Other Ambulatory Visit: Payer: Self-pay | Admitting: Nurse Practitioner

## 2019-02-18 DIAGNOSIS — E11618 Type 2 diabetes mellitus with other diabetic arthropathy: Secondary | ICD-10-CM

## 2019-02-18 DIAGNOSIS — R700 Elevated erythrocyte sedimentation rate: Secondary | ICD-10-CM

## 2019-02-18 DIAGNOSIS — R7982 Elevated C-reactive protein (CRP): Secondary | ICD-10-CM

## 2019-02-18 LAB — COMPREHENSIVE METABOLIC PANEL
ALT: 54 IU/L — ABNORMAL HIGH (ref 0–44)
AST: 51 IU/L — ABNORMAL HIGH (ref 0–40)
Albumin/Globulin Ratio: 1.3 (ref 1.2–2.2)
Albumin: 4.5 g/dL (ref 4.0–5.0)
Alkaline Phosphatase: 122 IU/L — ABNORMAL HIGH (ref 39–117)
BUN/Creatinine Ratio: 12 (ref 9–20)
BUN: 7 mg/dL (ref 6–24)
Bilirubin Total: 0.3 mg/dL (ref 0.0–1.2)
CO2: 22 mmol/L (ref 20–29)
Calcium: 9.7 mg/dL (ref 8.7–10.2)
Chloride: 101 mmol/L (ref 96–106)
Creatinine, Ser: 0.58 mg/dL — ABNORMAL LOW (ref 0.76–1.27)
GFR calc Af Amer: 140 mL/min/{1.73_m2} (ref >59)
GFR calc non Af Amer: 121 mL/min/{1.73_m2} (ref >59)
Globulin, Total: 3.5 g/dL (ref 1.5–4.5)
Glucose: 100 mg/dL — ABNORMAL HIGH (ref 65–99)
Potassium: 4.7 mmol/L (ref 3.5–5.2)
Sodium: 138 mmol/L (ref 134–144)
Total Protein: 8 g/dL (ref 6.0–8.5)

## 2019-02-18 LAB — URIC ACID: Uric Acid: 4.8 mg/dL (ref 3.8–8.4)

## 2019-02-18 LAB — C-REACTIVE PROTEIN: CRP: 29 mg/L — ABNORMAL HIGH (ref 0–10)

## 2019-02-18 LAB — SEDIMENTATION RATE: Sed Rate: 47 mm/hr — ABNORMAL HIGH (ref 0–15)

## 2019-03-17 ENCOUNTER — Ambulatory Visit: Payer: Self-pay | Admitting: Family Medicine

## 2019-05-13 ENCOUNTER — Other Ambulatory Visit: Payer: Self-pay | Admitting: Family Medicine

## 2019-05-13 DIAGNOSIS — M109 Gout, unspecified: Secondary | ICD-10-CM

## 2019-05-14 ENCOUNTER — Encounter: Payer: Self-pay | Admitting: Nurse Practitioner

## 2019-05-14 ENCOUNTER — Other Ambulatory Visit: Payer: Self-pay

## 2019-05-14 ENCOUNTER — Ambulatory Visit (INDEPENDENT_AMBULATORY_CARE_PROVIDER_SITE_OTHER): Payer: Self-pay | Admitting: Nurse Practitioner

## 2019-05-14 VITALS — BP 150/82 | HR 81 | Temp 98.4°F | Resp 16 | Ht 69.0 in | Wt 266.0 lb

## 2019-05-14 DIAGNOSIS — R809 Proteinuria, unspecified: Secondary | ICD-10-CM | POA: Insufficient documentation

## 2019-05-14 DIAGNOSIS — I10 Essential (primary) hypertension: Secondary | ICD-10-CM

## 2019-05-14 DIAGNOSIS — K2100 Gastro-esophageal reflux disease with esophagitis, without bleeding: Secondary | ICD-10-CM

## 2019-05-14 DIAGNOSIS — Z0000 Encounter for general adult medical examination without abnormal findings: Secondary | ICD-10-CM

## 2019-05-14 LAB — POCT URINALYSIS DIPSTICK
Bilirubin, UA: NEGATIVE
Glucose, UA: NEGATIVE
Ketones, UA: NEGATIVE
Leukocytes, UA: NEGATIVE
Nitrite, UA: NEGATIVE
Protein, UA: POSITIVE — AB
Spec Grav, UA: 1.025 (ref 1.010–1.025)
Urobilinogen, UA: 0.2 E.U./dL
pH, UA: 5.5 (ref 5.0–8.0)

## 2019-05-14 MED ORDER — OMEPRAZOLE 20 MG PO CPDR: 20.0000 mg | DELAYED_RELEASE_CAPSULE | Freq: Every day | ORAL | 3 refills | Status: DC

## 2019-05-14 MED ORDER — AMLODIPINE BESYLATE 10 MG PO TABS: 10.0000 mg | ORAL_TABLET | Freq: Every day | ORAL | 3 refills | Status: DC

## 2019-05-14 NOTE — Patient Instructions (Signed)

## 2019-05-14 NOTE — Progress Notes (Signed)
Established Patient Office Visit  Subjective:  Patient ID: Gene Reed, male    DOB: 1971/01/30  Age: 49 y.o. MRN: [id number]  CC:  Chief Complaint  Patient presents with  . Hypertension  . Medication Refill    gout meds     HPI Gene Reed presents for follow up. He  has a past medical history of Ankle joint pain, Arthritis, Bilateral chronic knee pain, Bone spur, Gout, Hypertension, Knee pain, and Urinary frequency (12/2018).   Hypertension Patient is here for follow-up of elevated blood pressure. He is exercising and is adherent to a low-salt diet. Blood pressure is not well controlled at home. Cardiac symptoms: none. Patient denies chest pain, dyspnea, fatigue, irregular heart beat, lower extremity edema and palpitations. Cardiovascular risk factors: hypertension, male gender, obesity (BMI >= 30 kg/m2) and sedentary lifestyle. Use of agents associated with hypertension: NSAIDS. History of target organ damage: none.   Past Medical History:  Diagnosis Date  . Ankle joint pain   . Arthritis   . Bilateral chronic knee pain   . Bone spur   . Gout   . Hypertension   . Knee pain   . Urinary frequency 12/2018    Past Surgical History:  Procedure Laterality Date  . KNEE SURGERY      History reviewed. No pertinent family history.  Social History   Socioeconomic History  . Marital status: Single    Spouse name: Not on file  . Number of children: Not on file  . Years of education: Not on file  . Highest education level: Not on file  Occupational History  . Not on file  Tobacco Use  . Smoking status: Former Games developer  . Smokeless tobacco: Never Used  Substance and Sexual Activity  . Alcohol use: Yes    Comment:  6 beers per a day   . Drug use: Yes    Types: Marijuana    Comment: 1 joint a day   . Sexual activity: Not Currently  Other Topics Concern  . Not on file  Social History Narrative  . Not on file   Social Determinants of Health   Financial Resource  Strain:   . Difficulty of Paying Living Expenses:   Food Insecurity:   . Worried About Programme researcher, broadcasting/film/video in the Last Year:   . Barista in the Last Year:   Transportation Needs:   . Freight forwarder (Medical):   Marland Kitchen Lack of Transportation (Non-Medical):   Physical Activity:   . Days of Exercise per Week:   . Minutes of Exercise per Session:   Stress:   . Feeling of Stress :   Social Connections:   . Frequency of Communication with Friends and Family:   . Frequency of Social Gatherings with Friends and Family:   . Attends Religious Services:   . Active Member of Clubs or Organizations:   . Attends Banker Meetings:   Marland Kitchen Marital Status:   Intimate Partner Violence:   . Fear of Current or Ex-Partner:   . Emotionally Abused:   Marland Kitchen Physically Abused:   . Sexually Abused:     Outpatient Medications Prior to Visit  Medication Sig Dispense Refill  . allopurinol (ZYLOPRIM) 100 MG tablet TAKE 1 TABLET BY MOUTH EVERY DAY 90 tablet 2  . amLODipine (NORVASC) 5 MG tablet Take 1 tablet (5 mg total) by mouth daily. 90 tablet 3  . colchicine 0.6 MG tablet Take 1 tablet (0.6 mg  total) by mouth every 8 (eight) hours as needed. For acute gouty attacks. (Patient not taking: Reported on 05/14/2019) 30 tablet 6  . HYDROcodone-acetaminophen (NORCO/VICODIN) 5-325 MG tablet Take 1 tablet by mouth every 6 (six) hours as needed. (Patient not taking: Reported on 02/17/2019) 5 tablet 0   No facility-administered medications prior to visit.    No Known Allergies  ROS Review of Systems  All other systems reviewed and are negative.     Objective:    Physical Exam  Constitutional: He is oriented to person, place, and time. He appears well-developed.  Obese   HENT:  Head: Normocephalic and atraumatic.  Cardiovascular: Normal rate, regular rhythm, normal heart sounds and intact distal pulses.  Pulmonary/Chest: Effort normal and breath sounds normal.  Musculoskeletal:         General: Normal range of motion.  Neurological: He is alert and oriented to person, place, and time. He has normal reflexes.  Skin: Skin is warm and dry.  Psychiatric: He has a normal mood and affect. His behavior is normal. Judgment and thought content normal.    BP (!) 150/82   Pulse 81   Temp 98.4 F (36.9 C) (Oral)   Resp 16   Ht 5\' 9"  (1.753 m)   Wt 266 lb (120.7 kg)   SpO2 97%   BMI 39.28 kg/m  Wt Readings from Last 3 Encounters:  05/14/19 266 lb (120.7 kg)  02/17/19 262 lb (118.8 kg)  11/24/18 247 lb (112 kg)     Health Maintenance Due  Topic Date Due  . URINE MICROALBUMIN  11/20/2017    There are no preventive care reminders to display for this patient.  Lab Results  Component Value Date   TSH 2.080 12/16/2018   Lab Results  Component Value Date   WBC 12.3 (H) 11/20/2016   HGB 13.3 11/20/2016   HCT 38.0 (L) 11/20/2016   MCV 90.5 11/20/2016   PLT 267 11/20/2016   Lab Results  Component Value Date   NA 136 05/14/2019   K 4.4 05/14/2019   CO2 22 02/17/2019   GLUCOSE 106 (H) 05/14/2019   BUN 9 05/14/2019   CREATININE 0.68 (L) 05/14/2019   BILITOT 0.7 05/14/2019   ALKPHOS 117 05/14/2019   AST 73 (H) 05/14/2019   ALT 54 (H) 02/17/2019   PROT 8.0 05/14/2019   ALBUMIN 4.5 05/14/2019   CALCIUM 9.5 05/14/2019   ANIONGAP 8 09/04/2015   Lab Results  Component Value Date   CHOL 239 (H) 05/14/2019   Lab Results  Component Value Date   HDL 38 (L) 05/14/2019   Lab Results  Component Value Date   LDLCALC 163 (H) 05/14/2019   Lab Results  Component Value Date   TRIG 203 (H) 05/14/2019   Lab Results  Component Value Date   CHOLHDL 6.3 (H) 05/14/2019   Lab Results  Component Value Date   HGBA1C 4.0 12/16/2018   HGBA1C 4.9 12/16/2018      Assessment & Plan:   Problem List Items Addressed This Visit      High   Essential hypertension - Primary   Relevant Medications   amLODipine (NORVASC) 10 MG tablet   Other Relevant Orders    Urinalysis Dipstick (Completed)   Comp. Metabolic Panel (12) (Completed)   Lipid panel (Completed)   Sedimentation rate (Completed)    Other Visit Diagnoses    Healthcare maintenance       Relevant Orders   Lipid panel (Completed)   Gastroesophageal reflux disease  with esophagitis without hemorrhage          Meds ordered this encounter  Medications  . amLODipine (NORVASC) 10 MG tablet    Sig: Take 1 tablet (10 mg total) by mouth daily.    Dispense:  90 tablet    Refill:  3    Order Specific Question:   Supervising Provider    Answer:   Tresa Garter [id number]  . omeprazole (PRILOSEC) 20 MG capsule    Sig: Take 1 capsule (20 mg total) by mouth daily.    Dispense:  30 capsule    Refill:  3    Order Specific Question:   Supervising Provider    Answer:   Tresa Garter [[id number]]    Follow-up: Return in about 3 months (around 08/13/2019).    Vevelyn Francois, NP

## 2019-05-15 LAB — COMP. METABOLIC PANEL (12)
AST: 73 IU/L — ABNORMAL HIGH (ref 0–40)
Albumin/Globulin Ratio: 1.3 (ref 1.2–2.2)
Albumin: 4.5 g/dL (ref 4.0–5.0)
Alkaline Phosphatase: 117 IU/L (ref 39–117)
BUN/Creatinine Ratio: 13 (ref 9–20)
BUN: 9 mg/dL (ref 6–24)
Bilirubin Total: 0.7 mg/dL (ref 0.0–1.2)
Calcium: 9.5 mg/dL (ref 8.7–10.2)
Chloride: 102 mmol/L (ref 96–106)
Creatinine, Ser: 0.68 mg/dL — ABNORMAL LOW (ref 0.76–1.27)
GFR calc Af Amer: 131 mL/min/{1.73_m2} (ref >59)
GFR calc non Af Amer: 113 mL/min/{1.73_m2} (ref >59)
Globulin, Total: 3.5 g/dL (ref 1.5–4.5)
Glucose: 106 mg/dL — ABNORMAL HIGH (ref 65–99)
Potassium: 4.4 mmol/L (ref 3.5–5.2)
Sodium: 136 mmol/L (ref 134–144)
Total Protein: 8 g/dL (ref 6.0–8.5)

## 2019-05-15 LAB — LIPID PANEL
Chol/HDL Ratio: 6.3 ratio — ABNORMAL HIGH (ref 0.0–5.0)
Cholesterol, Total: 239 mg/dL — ABNORMAL HIGH (ref 100–199)
HDL: 38 mg/dL — ABNORMAL LOW (ref >39)
LDL Chol Calc (NIH): 163 mg/dL — ABNORMAL HIGH (ref 0–99)
Triglycerides: 203 mg/dL — ABNORMAL HIGH (ref 0–149)
VLDL Cholesterol Cal: 38 mg/dL (ref 5–40)

## 2019-05-15 LAB — SEDIMENTATION RATE: Sed Rate: 43 mm/hr — ABNORMAL HIGH (ref 0–15)

## 2019-05-20 ENCOUNTER — Telehealth: Payer: Self-pay

## 2019-05-20 ENCOUNTER — Other Ambulatory Visit: Payer: Self-pay | Admitting: Nurse Practitioner

## 2019-05-20 DIAGNOSIS — E782 Mixed hyperlipidemia: Secondary | ICD-10-CM

## 2019-05-20 MED ORDER — ROSUVASTATIN CALCIUM 10 MG PO TABS: 10.0000 mg | ORAL_TABLET | Freq: Every day | ORAL | 11 refills | Status: DC

## 2019-05-20 NOTE — Telephone Encounter (Signed)
-----   Message from Barbette Merino, NP sent at 05/20/2019 10:23 AM EDT ----- Please make Gene Reed aware that overall his labs are stable.  Please please make him aware that his AST is elevated and this is related to alcohol use and possibly Tylenol use.Marland Kitchen  His total cholesterol is elevated along with the LDL and triglycerides.  I recommend that we would start a statin.  I would like to start Crestor 10 mg nightly and we will repeat cholesterol at his next visit.  Encourage diet and exercise.  Also make him aware that his sed rate this correlates with inflammation in the joint.  Please make you aware of this can be exacerbated with alcohol use also.

## 2019-05-20 NOTE — Telephone Encounter (Signed)
Called, no answer. No voicemail. Will try later.  

## 2019-05-24 ENCOUNTER — Telehealth: Payer: Self-pay | Admitting: Nurse Practitioner

## 2019-05-24 ENCOUNTER — Other Ambulatory Visit: Payer: Self-pay

## 2019-05-24 DIAGNOSIS — M109 Gout, unspecified: Secondary | ICD-10-CM

## 2019-05-24 MED ORDER — ALLOPURINOL 100 MG PO TABS: 100.0000 mg | ORAL_TABLET | Freq: Every day | ORAL | 2 refills | Status: DC

## 2019-05-24 MED ORDER — AMLODIPINE BESYLATE 10 MG PO TABS: 10.0000 mg | ORAL_TABLET | Freq: Every day | ORAL | 2 refills | Status: DC

## 2019-05-24 MED ORDER — COLCHICINE 0.6 MG PO TABS: 0.6000 mg | ORAL_TABLET | Freq: Three times a day (TID) | ORAL | 6 refills | Status: DC | PRN

## 2019-05-24 NOTE — Telephone Encounter (Signed)
Called, no answer. No voicemail set up.  

## 2019-05-24 NOTE — Telephone Encounter (Signed)
Called, no answer. Voicemail not set up.  °

## 2019-05-24 NOTE — Telephone Encounter (Signed)
Pt unable to afford 3 month supply of medicine on all prescriptions. Pt wants to know if he can get a 1 month supply of medicine with 2 refills on all his meds to make it more affordable. Please follow up with pt.

## 2019-05-24 NOTE — Telephone Encounter (Signed)
Spoke with patient and this has been fixed. Thanks!

## 2019-08-13 ENCOUNTER — Other Ambulatory Visit: Payer: Self-pay

## 2019-08-13 ENCOUNTER — Encounter: Payer: Self-pay | Admitting: Nurse Practitioner

## 2019-08-13 ENCOUNTER — Ambulatory Visit (INDEPENDENT_AMBULATORY_CARE_PROVIDER_SITE_OTHER): Payer: Self-pay | Admitting: Nurse Practitioner

## 2019-08-13 VITALS — BP 145/82 | HR 83 | Ht 69.0 in | Wt 261.0 lb

## 2019-08-13 DIAGNOSIS — E785 Hyperlipidemia, unspecified: Secondary | ICD-10-CM | POA: Insufficient documentation

## 2019-08-13 DIAGNOSIS — I10 Essential (primary) hypertension: Secondary | ICD-10-CM

## 2019-08-13 DIAGNOSIS — R0683 Snoring: Secondary | ICD-10-CM

## 2019-08-13 DIAGNOSIS — E782 Mixed hyperlipidemia: Secondary | ICD-10-CM

## 2019-08-13 DIAGNOSIS — Z0000 Encounter for general adult medical examination without abnormal findings: Secondary | ICD-10-CM

## 2019-08-13 DIAGNOSIS — R29818 Other symptoms and signs involving the nervous system: Secondary | ICD-10-CM

## 2019-08-13 LAB — POCT URINALYSIS DIPSTICK
Bilirubin, UA: NEGATIVE
Blood, UA: NEGATIVE
Glucose, UA: NEGATIVE
Ketones, UA: NEGATIVE
Leukocytes, UA: NEGATIVE
Nitrite, UA: NEGATIVE
Protein, UA: POSITIVE — AB
Spec Grav, UA: 1.025 (ref 1.010–1.025)
Urobilinogen, UA: 0.2 E.U./dL
pH, UA: 5.5 (ref 5.0–8.0)

## 2019-08-13 NOTE — Progress Notes (Signed)
[facility] Patient [facility] [address] [place], [place]  [address] Phone:  [phone]   Fax:  [phone]    Established Patient Office Visit  Subjective:  Patient ID: Gene Reed, male    DOB: 15-Dec-1970  Age: 50 y.o. MRN: [id number]  CC:  Chief Complaint  Patient presents with  . Follow-up    3 month follow; Feels mucus build up only night causing him dificulty to breath    HPI Gene Reed presents for follow up. He  has a past medical history of Ankle joint pain, Arthritis, Bilateral chronic knee pain, Bone spur, Gout, Hypertension, Knee pain, and Urinary frequency (12/2018).   Upper Respiratory Infection Patient complains of symptoms of a URI. Symptoms include nighttime throat congestion . Onset of symptoms was several weeks ago, and has been gradually worsening since that time. Treatment to date: none. He admits that he snores and sleeps with his mouth open.   Hypertension Patient is here for follow-up of elevated blood pressure. He is exercising but works fulltime and is adherent to a low-salt diet. Blood pressure is not monitiored at home. He denies headache, dizziness, visual changes, shortness of breath, dyspnea on exertion, chest pain, nausea, vomiting or any edema.  Cardiac symptoms: none. Patient denies chest pain, dyspnea, fatigue, irregular heart beat, lower extremity edema and syncope. He amdits that he does rest well at night. He amdits that his last good night sleep was about 3 years ago . Cardiovascular risk factors: dyslipidemia, hypertension, male gender and obesity (BMI >= 30 kg/m2). Use of agents associated with hypertension: none. History of target organ damage: none.    Past Medical History:  Diagnosis Date  . Ankle joint pain   . Arthritis   . Bilateral chronic knee pain   . Bone spur   . Gout   . Hypertension   . Knee pain   . Urinary frequency 12/2018    Past Surgical History:  Procedure Laterality Date  . KNEE SURGERY      No  family history on file.  Social History   Socioeconomic History  . Marital status: Single    Spouse name: Not on file  . Number of children: Not on file  . Years of education: Not on file  . Highest education level: Not on file  Occupational History  . Not on file  Tobacco Use  . Smoking status: Former Games developer  . Smokeless tobacco: Never Used  Vaping Use  . Vaping Use: Never used  Substance and Sexual Activity  . Alcohol use: Yes    Comment:  6 beers per a day   . Drug use: Yes    Types: Marijuana    Comment: 1 joint a day   . Sexual activity: Not Currently  Other Topics Concern  . Not on file  Social History Narrative  . Not on file   Social Determinants of Health   Financial Resource Strain:   . Difficulty of Paying Living Expenses:   Food Insecurity:   . Worried About Programme researcher, broadcasting/film/video in the Last Year:   . Barista in the Last Year:   Transportation Needs:   . Freight forwarder (Medical):   Marland Kitchen Lack of Transportation (Non-Medical):   Physical Activity:   . Days of Exercise per Week:   . Minutes of Exercise per Session:   Stress:   . Feeling of Stress :   Social Connections:   . Frequency of  Communication with Friends and Family:   . Frequency of Social Gatherings with Friends and Family:   . Attends Religious Services:   . Active Member of Clubs or Organizations:   . Attends Banker Meetings:   Marland Kitchen Marital Status:   Intimate Partner Violence:   . Fear of Current or Ex-Partner:   . Emotionally Abused:   Marland Kitchen Physically Abused:   . Sexually Abused:     Outpatient Medications Prior to Visit  Medication Sig Dispense Refill  . allopurinol (ZYLOPRIM) 100 MG tablet Take 1 tablet (100 mg total) by mouth daily. 30 tablet 2  . amLODipine (NORVASC) 10 MG tablet Take 1 tablet (10 mg total) by mouth daily. 30 tablet 2  . colchicine 0.6 MG tablet Take 1 tablet (0.6 mg total) by mouth every 8 (eight) hours as needed. For acute gouty attacks. 30  tablet 6  . omeprazole (PRILOSEC) 20 MG capsule Take 1 capsule (20 mg total) by mouth daily. 30 capsule 3  . rosuvastatin (CRESTOR) 10 MG tablet Take 1 tablet (10 mg total) by mouth daily. 30 tablet 11  . HYDROcodone-acetaminophen (NORCO/VICODIN) 5-325 MG tablet Take 1 tablet by mouth every 6 (six) hours as needed. (Patient not taking: Reported on 02/17/2019) 5 tablet 0   No facility-administered medications prior to visit.    No Known Allergies  ROS Review of Systems  All other systems reviewed and are negative.     Objective:    Physical Exam Vitals reviewed.  Constitutional:      Appearance: He is obese.  HENT:     Head: Normocephalic and atraumatic.     Nose: Nose normal.     Mouth/Throat:     Mouth: Mucous membranes are moist.  Cardiovascular:     Rate and Rhythm: Normal rate and regular rhythm.     Pulses: Normal pulses.     Heart sounds: Normal heart sounds.  Pulmonary:     Effort: Pulmonary effort is normal.     Breath sounds: Normal breath sounds.  Abdominal:     Palpations: Abdomen is soft.     Comments: Obese  Musculoskeletal:        General: Normal range of motion.     Cervical back: Normal range of motion.  Skin:    General: Skin is warm and dry.     Capillary Refill: Capillary refill takes less than 2 seconds.  Neurological:     General: No focal deficit present.     Mental Status: He is alert and oriented to person, place, and time.  Psychiatric:        Mood and Affect: Mood normal.        Behavior: Behavior normal.        Thought Content: Thought content normal.        Judgment: Judgment normal.     BP (!) 145/82 (BP Location: Right Arm, Patient Position: Sitting, Cuff Size: Large)   Pulse 83   Ht 5\' 9"  (1.753 m)   Wt 261 lb 0.4 oz (118.4 kg)   SpO2 99%   BMI 38.55 kg/m  Wt Readings from Last 3 Encounters:  08/13/19 261 lb 0.4 oz (118.4 kg)  05/14/19 266 lb (120.7 kg)  02/17/19 262 lb (118.8 kg)     There are no preventive care  reminders to display for this patient.  There are no preventive care reminders to display for this patient.  Lab Results  Component Value Date   TSH 2.080 12/16/2018  Lab Results  Component Value Date   WBC 9.9 08/13/2019   HGB 12.9 (L) 08/13/2019   HCT 37.8 08/13/2019   MCV 93 08/13/2019   PLT 263 08/13/2019   Lab Results  Component Value Date   NA 143 08/13/2019   K 3.9 08/13/2019   CO2 22 02/17/2019   GLUCOSE 100 (H) 08/13/2019   BUN 10 08/13/2019   CREATININE 0.64 (L) 08/13/2019   BILITOT 0.4 08/13/2019   ALKPHOS 146 (H) 08/13/2019   AST 91 (H) 08/13/2019   ALT 54 (H) 02/17/2019   PROT 8.1 08/13/2019   ALBUMIN 4.5 08/13/2019   CALCIUM 9.0 08/13/2019   ANIONGAP 8 09/04/2015   Lab Results  Component Value Date   CHOL 206 (H) 08/13/2019   Lab Results  Component Value Date   HDL 38 (L) 08/13/2019   Lab Results  Component Value Date   LDLCALC 123 (H) 08/13/2019   Lab Results  Component Value Date   TRIG 252 (H) 08/13/2019   Lab Results  Component Value Date   CHOLHDL 5.4 (H) 08/13/2019   Lab Results  Component Value Date   HGBA1C 4.0 12/16/2018   HGBA1C 4.9 12/16/2018      Assessment & Plan:   Problem List Items Addressed This Visit      Cardiovascular and Mediastinum   Essential hypertension - Primary   Relevant Orders   Urinalysis Dipstick (Completed)   Microalbumin / creatinine urine ratio (Completed)   Comp. Metabolic Panel (12) (Completed)   CBC with Differential/Platelet (Completed)     Other   Mixed hyperlipidemia   Relevant Orders   Lipid panel (Completed)    Other Visit Diagnoses    Loud snoring       Suspected sleep apnea       Healthcare maintenance       Relevant Orders   Hepatitis C Antibody (Completed)      No orders of the defined types were placed in this encounter.   Follow-up: Return in about 3 months (around 11/13/2019).    Barbette Merino, NP

## 2019-08-13 NOTE — Patient Instructions (Signed)

## 2019-08-14 LAB — COMP. METABOLIC PANEL (12)
AST: 91 IU/L — ABNORMAL HIGH (ref 0–40)
Albumin/Globulin Ratio: 1.3 (ref 1.2–2.2)
Albumin: 4.5 g/dL (ref 4.0–5.0)
Alkaline Phosphatase: 146 IU/L — ABNORMAL HIGH (ref 48–121)
BUN/Creatinine Ratio: 16 (ref 9–20)
BUN: 10 mg/dL (ref 6–24)
Bilirubin Total: 0.4 mg/dL (ref 0.0–1.2)
Calcium: 9 mg/dL (ref 8.7–10.2)
Chloride: 106 mmol/L (ref 96–106)
Creatinine, Ser: 0.64 mg/dL — ABNORMAL LOW (ref 0.76–1.27)
GFR calc Af Amer: 134 mL/min/{1.73_m2} (ref >59)
GFR calc non Af Amer: 116 mL/min/{1.73_m2} (ref >59)
Globulin, Total: 3.6 g/dL (ref 1.5–4.5)
Glucose: 100 mg/dL — ABNORMAL HIGH (ref 65–99)
Potassium: 3.9 mmol/L (ref 3.5–5.2)
Sodium: 143 mmol/L (ref 134–144)
Total Protein: 8.1 g/dL (ref 6.0–8.5)

## 2019-08-14 LAB — HEPATITIS C ANTIBODY: Hep C Virus Ab: <0.1 s/co ratio (ref 0.0–0.9)

## 2019-08-14 LAB — CBC WITH DIFFERENTIAL/PLATELET
Basophils Absolute: 0.1 10*3/uL (ref 0.0–0.2)
Basos: 1 %
EOS (ABSOLUTE): 0.3 10*3/uL (ref 0.0–0.4)
Eos: 3 %
Hematocrit: 37.8 % (ref 37.5–51.0)
Hemoglobin: 12.9 g/dL — ABNORMAL LOW (ref 13.0–17.7)
Immature Grans (Abs): 0 10*3/uL (ref 0.0–0.1)
Immature Granulocytes: 0 %
Lymphocytes Absolute: 3 10*3/uL (ref 0.7–3.1)
Lymphs: 30 %
MCH: 31.9 pg (ref 26.6–33.0)
MCHC: 34.1 g/dL (ref 31.5–35.7)
MCV: 93 fL (ref 79–97)
Monocytes Absolute: 0.8 10*3/uL (ref 0.1–0.9)
Monocytes: 9 %
Neutrophils Absolute: 5.7 10*3/uL (ref 1.4–7.0)
Neutrophils: 57 %
Platelets: 263 10*3/uL (ref 150–450)
RBC: 4.05 x10E6/uL — ABNORMAL LOW (ref 4.14–5.80)
RDW: 12.4 % (ref 11.6–15.4)
WBC: 9.9 10*3/uL (ref 3.4–10.8)

## 2019-08-14 LAB — LIPID PANEL
Chol/HDL Ratio: 5.4 ratio — ABNORMAL HIGH (ref 0.0–5.0)
Cholesterol, Total: 206 mg/dL — ABNORMAL HIGH (ref 100–199)
HDL: 38 mg/dL — ABNORMAL LOW (ref >39)
LDL Chol Calc (NIH): 123 mg/dL — ABNORMAL HIGH (ref 0–99)
Triglycerides: 252 mg/dL — ABNORMAL HIGH (ref 0–149)
VLDL Cholesterol Cal: 45 mg/dL — ABNORMAL HIGH (ref 5–40)

## 2019-08-14 LAB — MICROALBUMIN / CREATININE URINE RATIO
Creatinine, Urine: 150.2 mg/dL
Microalb/Creat Ratio: 37 mg/g creat — ABNORMAL HIGH (ref 0–29)
Microalbumin, Urine: 55 ug/mL

## 2019-08-17 ENCOUNTER — Encounter: Payer: Self-pay | Admitting: Nurse Practitioner

## 2019-08-19 ENCOUNTER — Ambulatory Visit (INDEPENDENT_AMBULATORY_CARE_PROVIDER_SITE_OTHER): Payer: Self-pay | Admitting: Nurse Practitioner

## 2019-08-19 ENCOUNTER — Other Ambulatory Visit: Payer: Self-pay

## 2019-08-19 ENCOUNTER — Encounter: Payer: Self-pay | Admitting: Nurse Practitioner

## 2019-08-19 VITALS — BP 142/96 | HR 90 | Temp 98.4°F | Resp 18 | Ht 65.0 in | Wt 256.6 lb

## 2019-08-19 DIAGNOSIS — R29818 Other symptoms and signs involving the nervous system: Secondary | ICD-10-CM

## 2019-08-19 DIAGNOSIS — I10 Essential (primary) hypertension: Secondary | ICD-10-CM

## 2019-08-19 DIAGNOSIS — Z7289 Other problems related to lifestyle: Secondary | ICD-10-CM

## 2019-08-19 DIAGNOSIS — Z789 Other specified health status: Secondary | ICD-10-CM

## 2019-08-19 DIAGNOSIS — M1040 Other secondary gout, unspecified site: Secondary | ICD-10-CM

## 2019-08-19 DIAGNOSIS — R55 Syncope and collapse: Secondary | ICD-10-CM

## 2019-08-19 NOTE — Progress Notes (Signed)
[facility] Patient [facility] [address] [place], [place]  [address] Phone:  [phone]   Fax:  [phone]   Acute Office Visit  Subjective:    Patient ID: Gene Reed, male    DOB: [birth year], 49 y.o.   MRN: [id number]  Chief Complaint  Patient presents with   Excessive Sweating    Pt states on 08/15/19 he was sitting getting his hair cut and all of a sudden his whole body broke out in a sweat, and  he was going in and out of conciousness.Pt also states it took about 30 mins for his body to kool off..Pt also states some times his throat closes up around some chemicals  or when he drinks cold water.    HPI Patient is in today for syncope. He  has a past medical history of Ankle joint pain, Arthritis, Bilateral chronic knee pain, Bone spur, Gout, Hypertension, Knee pain, and Urinary frequency (12/2018).  He was recently seen for a 3 month fu for his HTN and was doing well.  Patient complains of near syncope. Onset was 2 days ago. Symptoms have continued; fatigued low appetite and right sided upper abdominal pain. since that time. Patient describes the episode as syncopal episode happened while he was sitting in the barber chair receiving a hair cut. . Associated symptoms: abdominal pain and chest tightness. He admits that he has had one pervious episode of syncope 6 mons ago that last >30 minutes.  The patient denies diarrhea, excessive thirst, general feeling of lightheadedness, history of CAD, melena, nausea, tachycardia/palpitations and visual aura. Medications putting patient at risk for syncope: beta blockers. He does continue to use ETOH but has vowed to stop.    Past Medical History:  Diagnosis Date   Ankle joint pain    Arthritis    Bilateral chronic knee pain    Bone spur    Gout    Hypertension    Knee pain    Urinary frequency 12/2018    Past Surgical History:  Procedure Laterality Date   KNEE SURGERY      No family history on file.  Social  History   Socioeconomic History   Marital status: Single    Spouse name: Not on file   Number of children: Not on file   Years of education: Not on file   Highest education level: Not on file  Occupational History   Not on file  Tobacco Use   Smoking status: Former Smoker   Smokeless tobacco: Never Used  Building services engineer Use: Never used  Substance and Sexual Activity   Alcohol use: Yes    Comment:  6 beers per a day    Drug use: Yes    Types: Marijuana    Comment: 1 joint a day    Sexual activity: Not Currently  Other Topics Concern   Not on file  Social History Narrative   Not on file   Social Determinants of Health   Financial Resource Strain:    Difficulty of Paying Living Expenses:   Food Insecurity:    Worried About Programme researcher, broadcasting/film/video in the Last Year:    Barista in the Last Year:   Transportation Needs:    Freight forwarder (Medical):    Lack of Transportation (Non-Medical):   Physical Activity:    Days of Exercise per Week:    Minutes of Exercise per Session:   Stress:    Feeling  of Stress :   Social Connections:    Frequency of Communication with Friends and Family:    Frequency of Social Gatherings with Friends and Family:    Attends Religious Services:    Active Member of Clubs or Organizations:    Attends Engineer, structural:    Marital Status:   Intimate Partner Violence:    Fear of Current or Ex-Partner:    Emotionally Abused:    Physically Abused:    Sexually Abused:     Outpatient Medications Prior to Visit  Medication Sig Dispense Refill   allopurinol (ZYLOPRIM) 100 MG tablet Take 1 tablet (100 mg total) by mouth daily. 30 tablet 2   amLODipine (NORVASC) 10 MG tablet Take 1 tablet (10 mg total) by mouth daily. 30 tablet 2   colchicine 0.6 MG tablet Take 1 tablet (0.6 mg total) by mouth every 8 (eight) hours as needed. For acute gouty attacks. 30 tablet 6   omeprazole (PRILOSEC)  20 MG capsule Take 1 capsule (20 mg total) by mouth daily. 30 capsule 3   rosuvastatin (CRESTOR) 10 MG tablet Take 1 tablet (10 mg total) by mouth daily. 30 tablet 11   No facility-administered medications prior to visit.    No Known Allergies  Review of Systems  All other systems reviewed and are negative.      Objective:    Physical Exam Constitutional:      General: He is not in acute distress.    Appearance: He is obese. He is not ill-appearing, toxic-appearing or diaphoretic.  HENT:     Head: Normocephalic and atraumatic.     Nose: Nose normal.     Mouth/Throat:     Mouth: Mucous membranes are moist.  Eyes:     Pupils: Pupils are equal, round, and reactive to light.  Cardiovascular:     Rate and Rhythm: Normal rate and regular rhythm.     Pulses: Normal pulses.  Pulmonary:     Effort: Pulmonary effort is normal.     Breath sounds: Normal breath sounds.  Abdominal:     Comments: Increased abdominal girth  Musculoskeletal:        General: Swelling present.     Cervical back: Normal range of motion.  Skin:    General: Skin is warm and dry.  Neurological:     General: No focal deficit present.     Mental Status: He is alert and oriented to person, place, and time.  Psychiatric:        Mood and Affect: Mood normal.        Behavior: Behavior normal.        Thought Content: Thought content normal.        Judgment: Judgment normal.     BP (!) 142/96    Pulse 90    Temp 98.4 F (36.9 C)    Resp 18    Ht 5\' 5"  (1.651 m)    Wt 256 lb 9.6 oz (116.4 kg)    SpO2 98%    BMI 42.70 kg/m  Wt Readings from Last 3 Encounters:  08/19/19 256 lb 9.6 oz (116.4 kg)  08/13/19 261 lb 0.4 oz (118.4 kg)  05/14/19 266 lb (120.7 kg)    There are no preventive care reminders to display for this patient.  There are no preventive care reminders to display for this patient.   Lab Results  Component Value Date   TSH 2.080 12/16/2018   Lab Results  Component Value Date  WBC  9.9 08/13/2019   HGB 12.9 (L) 08/13/2019   HCT 37.8 08/13/2019   MCV 93 08/13/2019   PLT 263 08/13/2019   Lab Results  Component Value Date   NA 143 08/13/2019   K 3.9 08/13/2019   CO2 22 02/17/2019   GLUCOSE 100 (H) 08/13/2019   BUN 10 08/13/2019   CREATININE 0.64 (L) 08/13/2019   BILITOT 0.4 08/13/2019   ALKPHOS 146 (H) 08/13/2019   AST 91 (H) 08/13/2019   ALT 54 (H) 02/17/2019   PROT 8.1 08/13/2019   ALBUMIN 4.5 08/13/2019   CALCIUM 9.0 08/13/2019   ANIONGAP 8 09/04/2015   Lab Results  Component Value Date   CHOL 206 (H) 08/13/2019   Lab Results  Component Value Date   HDL 38 (L) 08/13/2019   Lab Results  Component Value Date   LDLCALC 123 (H) 08/13/2019   Lab Results  Component Value Date   TRIG 252 (H) 08/13/2019   Lab Results  Component Value Date   CHOLHDL 5.4 (H) 08/13/2019   Lab Results  Component Value Date   HGBA1C 4.0 12/16/2018   HGBA1C 4.9 12/16/2018       Assessment & Plan:   Problem List Items Addressed This Visit      Cardiovascular and Mediastinum   Essential hypertension Continue with current regimen Consider cardiology referral due to EKG indicating heart block Encourage continued lifestyle modification and incorporating regular exercise for weight loss Encourage patient to discontinue alcohol use    Other Visit Diagnoses    Syncope, unspecified syncope type    -  Primary Encourage patient to go to the emergency room for further evaluation due to repeat and syncopal episode and not feeling well   Suspected sleep apnea   Encourage patient to consider sleep study Discussed again agreed potential dangers with untreated   Other secondary gout, unspecified chronicity, unspecified site           No orders of the defined types were placed in this encounter.    Barbette Merino, NP

## 2019-08-19 NOTE — Progress Notes (Signed)
[facility]ated [facility]al [facility] Case Management Referral Note  08/19/2019 Name: Gene Reed MRN: [id number] DOB: 1970-08-08 Gene Reed is a 49 y.o. year old male who sees Barbette Merino, NP for primary care. LCSW was consulted to assess patient's needs and assist the patient with Financial Difficulties related to lack of insruance coverage.  Interpreter: No.   Interpreter Name & Language: none  Assessment: Patient experiencing Financial constraints related to low income and no insurance coverage. Patient works but is unable to afford insurance. He would like to apply for the [employer]. Reviewed application orange card and [facility] financial assistance applications with patient. Patient to complete applications and gather supporting documents and then CSW to assist in scheduling an appointment with financial assistance at [facility]).   Review of patient status, including review of consultants reports, relevant laboratory and other test results, and collaboration with appropriate care team members and the patient's provider was performed as part of comprehensive patient evaluation and provision of services.    SDOH (Social Determinants of Health) assessments performed: No    Outpatient Encounter Medications as of 08/19/2019  Medication Sig   allopurinol (ZYLOPRIM) 100 MG tablet Take 1 tablet (100 mg total) by mouth daily.   amLODipine (NORVASC) 10 MG tablet Take 1 tablet (10 mg total) by mouth daily.   colchicine 0.6 MG tablet Take 1 tablet (0.6 mg total) by mouth every 8 (eight) hours as needed. For acute gouty attacks.   omeprazole (PRILOSEC) 20 MG capsule Take 1 capsule (20 mg total) by mouth daily.   rosuvastatin (CRESTOR) 10 MG tablet Take 1 tablet (10 mg total) by mouth daily.   No facility-administered encounter medications on file as of 08/19/2019.    Goals Addressed   None      Follow up Plan: 1.Patient to gather documents and complete  applications 2. CSW to follow and assist with financial assistance appointment  Abigail Butts, LCSW [facility] [phone]

## 2019-08-19 NOTE — Patient Instructions (Signed)
     Syncope Syncope is when you pass out (faint) for a short time. It is caused by a sudden decrease in blood flow to the brain. Signs that you may be about to pass out include:  Feeling dizzy or light-headed.  Feeling sick to your stomach (nauseous).  Seeing all white or all black.  Having cold, clammy skin. If you pass out, get help right away. Call your local emergency services (911 in the [place] Do not drive yourself to the hospital. Follow these instructions at home: Watch for any changes in your symptoms. Take these actions to stay safe and help with your symptoms: Lifestyle  Do not drive, use machinery, or play sports until your doctor says it is okay.  Do not drink alcohol.  Do not use any products that contain nicotine or tobacco, such as cigarettes and e-cigarettes. If you need help quitting, ask your doctor.  Drink enough fluid to keep your pee (urine) pale yellow. General instructions  Take over-the-counter and prescription medicines only as told by your doctor.  If you are taking blood pressure or heart medicine, sit up and stand up slowly. Spend a few minutes getting ready to sit and then stand. This can help you feel less dizzy.  Have someone stay with you until you feel stable.  If you start to feel like you might pass out, lie down right away and raise (elevate) your feet above the level of your heart. Breathe deeply and steadily. Wait until all of the symptoms are gone.  Keep all follow-up visits as told by your doctor. This is important. Get help right away if:  You have a very bad headache.  You pass out once or more than once.  You have pain in your chest, belly, or back.  You have a very fast or uneven heartbeat (palpitations).  It hurts to breathe.  You are bleeding from your mouth or your bottom (rectum).  You have black or tarry poop (stool).  You have jerky movements that you cannot control (seizure).  You are confused.  You have  trouble walking.  You are very weak.  You have vision problems. These symptoms may be an emergency. Do not wait to see if the symptoms will go away. Get medical help right away. Call your local emergency services (911 in the [place] Do not drive yourself to the hospital. Summary  Syncope is when you pass out (faint) for a short time. It is caused by a sudden decrease in blood flow to the brain.  Signs that you may be about to faint include feeling dizzy, light-headed, or sick to your stomach, seeing all white or all black, or having cold, clammy skin.  If you start to feel like you might pass out, lie down right away and raise (elevate) your feet above the level of your heart. Breathe deeply and steadily. Wait until all of the symptoms are gone. This information is not intended to replace advice given to you by your health care provider. Make sure you discuss any questions you have with your health care provider. Document Revised: 03/05/2017 Document Reviewed: 03/05/2017 Elsevier Patient Education  2020 Elsevier Inc.  

## 2019-08-26 DIAGNOSIS — R0683 Snoring: Secondary | ICD-10-CM | POA: Insufficient documentation

## 2019-08-26 DIAGNOSIS — R079 Chest pain, unspecified: Secondary | ICD-10-CM | POA: Insufficient documentation

## 2019-09-02 ENCOUNTER — Encounter: Payer: Self-pay | Admitting: Clinical

## 2019-09-02 NOTE — Progress Notes (Signed)
[facility]ated [facility]al Health General Follow Up Note  09/02/2019 Name: Gene Reed MRN: [id number] DOB: 1970/10/01 Gene Reed is a 49 y.o. year old male who sees Barbette Merino, NP for primary care. LCSW was initially consulted to assess patient's needs and assist the patient with financial difficulties related to lack of insurance coverage.  Interpreter: No.   Interpreter Name & Language: none  Ongoing Intervention: Patient experiencing financial constraints related to low income and no insurance coverage. Today patient presented at the clinic to review his supporting documents for [facility] application and for assistance completing application. CSW assisted in completing the application and advised patient on additional supporting documents he needs to gather. Called [facility] together with patient and scheduled financial assistance appointment for 09/07/19. Patient indicated he will have the rest of the documents together by then. CSW available for additional support as needed.  Review of patient status, including review of consultants reports, relevant laboratory and other test results, and collaboration with appropriate care team members and the patient's provider was performed as part of comprehensive patient evaluation and provision of services.     Follow up Plan: 1. CSW available from clinic as needed.  Abigail Butts, LCSW [facility] [phone]

## 2019-09-07 ENCOUNTER — Ambulatory Visit: Payer: Self-pay | Attending: Family Medicine

## 2019-09-07 ENCOUNTER — Other Ambulatory Visit: Payer: Self-pay

## 2019-09-21 ENCOUNTER — Telehealth: Payer: Self-pay | Admitting: Nurse Practitioner

## 2019-09-21 NOTE — Telephone Encounter (Signed)
Pt was sent a letter from financial dept. Inform them, that the application they submitted was incomplete, since they were missing some documentation at the time of the appointment, Pt need to reschedule and resubmit all new papers and application for [employer] and [facility], P.S. old documents has been sent back by mail to the Pt and Pt. need to make a new appt. 

## 2019-11-12 ENCOUNTER — Ambulatory Visit (INDEPENDENT_AMBULATORY_CARE_PROVIDER_SITE_OTHER): Payer: Self-pay | Admitting: Nurse Practitioner

## 2019-11-12 ENCOUNTER — Encounter: Payer: Self-pay | Admitting: Nurse Practitioner

## 2019-11-12 ENCOUNTER — Other Ambulatory Visit: Payer: Self-pay

## 2019-11-12 VITALS — BP 160/88 | HR 88 | Temp 97.7°F | Ht 65.0 in | Wt 272.0 lb

## 2019-11-12 DIAGNOSIS — I10 Essential (primary) hypertension: Secondary | ICD-10-CM

## 2019-11-12 DIAGNOSIS — E782 Mixed hyperlipidemia: Secondary | ICD-10-CM

## 2019-11-12 DIAGNOSIS — M109 Gout, unspecified: Secondary | ICD-10-CM

## 2019-11-12 MED ORDER — AMLODIPINE BESYLATE 10 MG PO TABS: 10.0000 mg | ORAL_TABLET | Freq: Every day | ORAL | 3 refills | Status: DC

## 2019-11-12 MED ORDER — ALLOPURINOL 100 MG PO TABS: 100.0000 mg | ORAL_TABLET | Freq: Every day | ORAL | 3 refills | Status: DC

## 2019-11-12 MED ORDER — OMEPRAZOLE 20 MG PO CPDR: 20.0000 mg | DELAYED_RELEASE_CAPSULE | Freq: Every day | ORAL | 3 refills | Status: DC

## 2019-11-12 MED ORDER — COLCHICINE 0.6 MG PO TABS: 0.6000 mg | ORAL_TABLET | Freq: Three times a day (TID) | ORAL | 6 refills | Status: DC | PRN

## 2019-11-12 NOTE — Progress Notes (Signed)
[facility] Patient [facility] [address] [place], [place]  [address] Phone:  [phone]   Fax:  [phone]   Established Patient Office Visit  Subjective:  Patient ID: Gene Reed, male    DOB: 1970-05-11  Age: 49 y.o. MRN: [id number]  CC:  Chief Complaint  Patient presents with   Follow-up    HPI Gene Reed presents for follow up. He  has a past medical history of Ankle joint pain, Arthritis, Bilateral chronic knee pain, Bone spur, Gout, Hypertension, Knee pain, and Urinary frequency (12/2018).   Hypertension Patient is here for follow-up of elevated blood pressure. He is not exercising and is not adherent to a low-salt diet. He loves cheese. He works full time. Blood pressure is not monitored at home. Cardiac symptoms: none. Patient denies chest pain, dyspnea, exertional chest pressure/discomfort, fatigue, irregular heart beat, lower extremity edema and syncope. Cardiovascular risk factors: hypertension, male gender, obesity (BMI >= 30 kg/m2) and sedentary lifestyle. Use of agents associated with hypertension: NSAIDS. History of target organ damage: none.   Past Medical History:  Diagnosis Date   Ankle joint pain    Arthritis    Bilateral chronic knee pain    Bone spur    Gout    Hypertension    Knee pain    Urinary frequency 12/2018    Past Surgical History:  Procedure Laterality Date   KNEE SURGERY      History reviewed. No pertinent family history.  Social History   Socioeconomic History   Marital status: Single    Spouse name: Not on file   Number of children: Not on file   Years of education: Not on file   Highest education level: Not on file  Occupational History   Not on file  Tobacco Use   Smoking status: Former Smoker   Smokeless tobacco: Never Used  Building services engineer Use: Never used  Substance and Sexual Activity   Alcohol use: Yes    Comment:  6 beers per a day    Drug use: Yes    Types: Marijuana     Comment: 1 joint a day    Sexual activity: Not Currently  Other Topics Concern   Not on file  Social History Narrative   Not on file   Social Determinants of Health   Financial Resource Strain:    Difficulty of Paying Living Expenses: Not on file  Food Insecurity:    Worried About Running Out of Food in the Last Year: Not on file   Ran Out of Food in the Last Year: Not on file  Transportation Needs:    Lack of Transportation (Medical): Not on file   Lack of Transportation (Non-Medical): Not on file  Physical Activity:    Days of Exercise per Week: Not on file   Minutes of Exercise per Session: Not on file  Stress:    Feeling of Stress : Not on file  Social Connections:    Frequency of Communication with Friends and Family: Not on file   Frequency of Social Gatherings with Friends and Family: Not on file   Attends Religious Services: Not on file   Active Member of Clubs or Organizations: Not on file   Attends Banker Meetings: Not on file   Marital Status: Not on file  Intimate Partner Violence:    Fear of Current or Ex-Partner: Not on file   Emotionally Abused: Not on file   Physically Abused:  Not on file   Sexually Abused: Not on file    Outpatient Medications Prior to Visit  Medication Sig Dispense Refill   rosuvastatin (CRESTOR) 10 MG tablet Take 1 tablet (10 mg total) by mouth daily. 30 tablet 11   allopurinol (ZYLOPRIM) 100 MG tablet Take 1 tablet (100 mg total) by mouth daily. 30 tablet 2   amLODipine (NORVASC) 10 MG tablet Take 1 tablet (10 mg total) by mouth daily. 30 tablet 2   omeprazole (PRILOSEC) 20 MG capsule Take 1 capsule (20 mg total) by mouth daily. 30 capsule 3   colchicine 0.6 MG tablet Take 1 tablet (0.6 mg total) by mouth every 8 (eight) hours as needed. For acute gouty attacks. (Patient not taking: Reported on 11/12/2019) 30 tablet 6   No facility-administered medications prior to visit.    No Known  Allergies  ROS Review of Systems  Eyes: Negative.   Respiratory: Positive for shortness of breath (active ).   Cardiovascular: Negative for chest pain.  Endocrine: Negative.   Musculoskeletal: Negative.   Neurological: Negative for dizziness, syncope and headaches.  Hematological: Negative.   All other systems reviewed and are negative.     Objective:    Physical Exam Constitutional:      Appearance: He is obese.  HENT:     Head: Normocephalic and atraumatic.  Cardiovascular:     Rate and Rhythm: Normal rate and regular rhythm.     Pulses: Normal pulses.     Heart sounds: Normal heart sounds.  Pulmonary:     Effort: Pulmonary effort is normal.     Breath sounds: Normal breath sounds.  Abdominal:     Palpations: Abdomen is soft.     Comments: Increased abdominal girth   Musculoskeletal:        General: Normal range of motion.  Skin:    General: Skin is warm and dry.     Capillary Refill: Capillary refill takes less than 2 seconds.  Neurological:     General: No focal deficit present.     Mental Status: He is alert and oriented to person, place, and time.  Psychiatric:        Mood and Affect: Mood normal.        Behavior: Behavior normal.        Thought Content: Thought content normal.        Judgment: Judgment normal.     BP (!) 160/88 (BP Location: Right Arm, Patient Position: Sitting, Cuff Size: Large)    Pulse 88    Temp 97.7 F (36.5 C) (Temporal)    Ht 5\' 5"  (1.651 m)    Wt 272 lb (123.4 kg)    SpO2 98%    BMI 45.26 kg/m  Wt Readings from Last 3 Encounters:  11/12/19 272 lb (123.4 kg)  08/19/19 256 lb 9.6 oz (116.4 kg)  08/13/19 261 lb 0.4 oz (118.4 kg)     There are no preventive care reminders to display for this patient.  There are no preventive care reminders to display for this patient.  Lab Results  Component Value Date   TSH 2.080 12/16/2018   Lab Results  Component Value Date   WBC 9.9 08/13/2019   HGB 12.9 (L) 08/13/2019   HCT 37.8  08/13/2019   MCV 93 08/13/2019   PLT 263 08/13/2019   Lab Results  Component Value Date   NA 143 08/13/2019   K 3.9 08/13/2019   CO2 22 02/17/2019   GLUCOSE 100 (H) 08/13/2019  BUN 10 08/13/2019   CREATININE 0.64 (L) 08/13/2019   BILITOT 0.4 08/13/2019   ALKPHOS 146 (H) 08/13/2019   AST 91 (H) 08/13/2019   ALT 54 (H) 02/17/2019   PROT 8.1 08/13/2019   ALBUMIN 4.5 08/13/2019   CALCIUM 9.0 08/13/2019   ANIONGAP 8 09/04/2015   Lab Results  Component Value Date   CHOL 206 (H) 08/13/2019   Lab Results  Component Value Date   HDL 38 (L) 08/13/2019   Lab Results  Component Value Date   LDLCALC 123 (H) 08/13/2019   Lab Results  Component Value Date   TRIG 252 (H) 08/13/2019   Lab Results  Component Value Date   CHOLHDL 5.4 (H) 08/13/2019   Lab Results  Component Value Date   HGBA1C 4.0 12/16/2018   HGBA1C 4.9 12/16/2018      Assessment & Plan:   Problem List Items Addressed This Visit      Cardiovascular and Mediastinum   Near syncope - Primary   Relevant Medications   amLODipine (NORVASC) 10 MG tablet     Musculoskeletal and Integument   Acute gout of foot   Relevant Medications   allopurinol (ZYLOPRIM) 100 MG tablet   colchicine 0.6 MG tablet     Other   Hyperlipidemia   Relevant Medications   amLODipine (NORVASC) 10 MG tablet   Other Relevant Orders   Lipid panel      Meds ordered this encounter  Medications   allopurinol (ZYLOPRIM) 100 MG tablet    Sig: Take 1 tablet (100 mg total) by mouth daily.    Dispense:  90 tablet    Refill:  3    Order Specific Question:   Supervising Provider    Answer:   Quentin Angst [[id number]]   amLODipine (NORVASC) 10 MG tablet    Sig: Take 1 tablet (10 mg total) by mouth daily.    Dispense:  90 tablet    Refill:  3    Order Specific Question:   Supervising Provider    Answer:   Quentin Angst [[id number]]   omeprazole (PRILOSEC) 20 MG capsule    Sig: Take 1 capsule (20 mg total) by mouth  daily.    Dispense:  90 capsule    Refill:  3    Order Specific Question:   Supervising Provider    Answer:   Quentin Angst [id number]   colchicine 0.6 MG tablet    Sig: Take 1 tablet (0.6 mg total) by mouth every 8 (eight) hours as needed. For acute gouty attacks.    Dispense:  30 tablet    Refill:  6    Order Specific Question:   Supervising Provider    Answer:   Quentin Angst [[id number]]    Follow-up: Return in about 3 months (around 02/12/2020).    Barbette Merino, NP

## 2019-11-12 NOTE — Patient Instructions (Signed)
   Managing Your Hypertension Hypertension is commonly called high blood pressure. This is when the force of your blood pressing against the walls of your arteries is too strong. Arteries are blood vessels that carry blood from your heart throughout your body. Hypertension forces the heart to work harder to pump blood, and may cause the arteries to become narrow or stiff. Having untreated or uncontrolled hypertension can cause heart attack, stroke, kidney disease, and other problems. What are blood pressure readings? A blood pressure reading consists of a higher number over a lower number. Ideally, your blood pressure should be below 120/80. The first ("top") number is called the systolic pressure. It is a measure of the pressure in your arteries as your heart beats. The second ("bottom") number is called the diastolic pressure. It is a measure of the pressure in your arteries as the heart relaxes. What does my blood pressure reading mean? Blood pressure is classified into four stages. Based on your blood pressure reading, your health care provider may use the following stages to determine what type of treatment you need, if any. Systolic pressure and diastolic pressure are measured in a unit called mm Hg. Normal  Systolic pressure: below 120.  Diastolic pressure: below 80. Elevated  Systolic pressure: 120-129.  Diastolic pressure: below 80. Hypertension stage 1  Systolic pressure: 130-139.  Diastolic pressure: 80-89. Hypertension stage 2  Systolic pressure: 140 or above.  Diastolic pressure: 90 or above. What health risks are associated with hypertension? Managing your hypertension is an important responsibility. Uncontrolled hypertension can lead to:  A heart attack.  A stroke.  A weakened blood vessel (aneurysm).  Heart failure.  Kidney damage.  Eye damage.  Metabolic syndrome.  Memory and concentration problems. What changes can I make to manage my  hypertension? Hypertension can be managed by making lifestyle changes and possibly by taking medicines. Your health care provider will help you make a plan to bring your blood pressure within a normal range. Eating and drinking   Eat a diet that is high in fiber and potassium, and low in salt (sodium), added sugar, and fat. An example eating plan is called the DASH (Dietary Approaches to Stop Hypertension) diet. To eat this way: ? Eat plenty of fresh fruits and vegetables. Try to fill half of your plate at each meal with fruits and vegetables. ? Eat whole grains, such as whole wheat pasta, brown rice, or whole grain bread. Fill about one quarter of your plate with whole grains. ? Eat low-fat diary products. ? Avoid fatty cuts of meat, processed or cured meats, and poultry with skin. Fill about one quarter of your plate with lean proteins such as fish, chicken without skin, beans, eggs, and tofu. ? Avoid premade and processed foods. These tend to be higher in sodium, added sugar, and fat.  Reduce your daily sodium intake. Most people with hypertension should eat less than 1,500 mg of sodium a day.  Limit alcohol intake to no more than 1 drink a day for nonpregnant women and 2 drinks a day for men. One drink equals 12 oz of beer, 5 oz of wine, or 1 oz of hard liquor. Lifestyle  Work with your health care provider to maintain a healthy body weight, or to lose weight. Ask what an ideal weight is for you.  Get at least 30 minutes of exercise that causes your heart to beat faster (aerobic exercise) most days of the week. Activities may include walking, swimming, or biking.    Include exercise to strengthen your muscles (resistance exercise), such as weight lifting, as part of your weekly exercise routine. Try to do these types of exercises for 30 minutes at least 3 days a week.  Do not use any products that contain nicotine or tobacco, such as cigarettes and e-cigarettes. If you need help quitting,  ask your health care provider.  Control any long-term (chronic) conditions you have, such as high cholesterol or diabetes. Monitoring  Monitor your blood pressure at home as told by your health care provider. Your personal target blood pressure may vary depending on your medical conditions, your age, and other factors.  Have your blood pressure checked regularly, as often as told by your health care provider. Working with your health care provider  Review all the medicines you take with your health care provider because there may be side effects or interactions.  Talk with your health care provider about your diet, exercise habits, and other lifestyle factors that may be contributing to hypertension.  Visit your health care provider regularly. Your health care provider can help you create and adjust your plan for managing hypertension. Will I need medicine to control my blood pressure? Your health care provider may prescribe medicine if lifestyle changes are not enough to get your blood pressure under control, and if:  Your systolic blood pressure is 130 or higher.  Your diastolic blood pressure is 80 or higher. Take medicines only as told by your health care provider. Follow the directions carefully. Blood pressure medicines must be taken as prescribed. The medicine does not work as well when you skip doses. Skipping doses also puts you at risk for problems. Contact a health care provider if:  You think you are having a reaction to medicines you have taken.  You have repeated (recurrent) headaches.  You feel dizzy.  You have swelling in your ankles.  You have trouble with your vision. Get help right away if:  You develop a severe headache or confusion.  You have unusual weakness or numbness, or you feel faint.  You have severe pain in your chest or abdomen.  You vomit repeatedly.  You have trouble breathing. Summary  Hypertension is when the force of blood pumping  through your arteries is too strong. If this condition is not controlled, it may put you at risk for serious complications.  Your personal target blood pressure may vary depending on your medical conditions, your age, and other factors. For most people, a normal blood pressure is less than 120/80.  Hypertension is managed by lifestyle changes, medicines, or both. Lifestyle changes include weight loss, eating a healthy, low-sodium diet, exercising more, and limiting alcohol. This information is not intended to replace advice given to you by your health care provider. Make sure you discuss any questions you have with your health care provider. Document Revised: 05/15/2018 Document Reviewed: 12/20/2015 Elsevier Patient Education  2020 Elsevier Inc.  

## 2019-11-13 LAB — COMP. METABOLIC PANEL (12)
AST: 46 IU/L — ABNORMAL HIGH (ref 0–40)
Albumin/Globulin Ratio: 1.3 (ref 1.2–2.2)
Albumin: 4.5 g/dL (ref 4.0–5.0)
Alkaline Phosphatase: 131 IU/L — ABNORMAL HIGH (ref 44–121)
BUN/Creatinine Ratio: 14 (ref 9–20)
BUN: 9 mg/dL (ref 6–24)
Bilirubin Total: 0.4 mg/dL (ref 0.0–1.2)
Calcium: 8.9 mg/dL (ref 8.7–10.2)
Chloride: 104 mmol/L (ref 96–106)
Creatinine, Ser: 0.65 mg/dL — ABNORMAL LOW (ref 0.76–1.27)
GFR calc Af Amer: 133 mL/min/{1.73_m2} (ref >59)
GFR calc non Af Amer: 115 mL/min/{1.73_m2} (ref >59)
Globulin, Total: 3.4 g/dL (ref 1.5–4.5)
Glucose: 136 mg/dL — ABNORMAL HIGH (ref 65–99)
Potassium: 4 mmol/L (ref 3.5–5.2)
Sodium: 141 mmol/L (ref 134–144)
Total Protein: 7.9 g/dL (ref 6.0–8.5)

## 2019-11-13 LAB — LIPID PANEL
Chol/HDL Ratio: 5.7 ratio — ABNORMAL HIGH (ref 0.0–5.0)
Cholesterol, Total: 206 mg/dL — ABNORMAL HIGH (ref 100–199)
HDL: 36 mg/dL — ABNORMAL LOW (ref >39)
LDL Chol Calc (NIH): 133 mg/dL — ABNORMAL HIGH (ref 0–99)
Triglycerides: 205 mg/dL — ABNORMAL HIGH (ref 0–149)
VLDL Cholesterol Cal: 37 mg/dL (ref 5–40)

## 2020-02-11 ENCOUNTER — Other Ambulatory Visit: Payer: Self-pay

## 2020-02-11 ENCOUNTER — Other Ambulatory Visit: Payer: Self-pay | Admitting: Nurse Practitioner

## 2020-02-11 ENCOUNTER — Ambulatory Visit (INDEPENDENT_AMBULATORY_CARE_PROVIDER_SITE_OTHER): Payer: Self-pay | Admitting: Nurse Practitioner

## 2020-02-11 ENCOUNTER — Encounter: Payer: Self-pay | Admitting: Nurse Practitioner

## 2020-02-11 VITALS — BP 142/87 | HR 82 | Temp 97.9°F | Ht 65.0 in | Wt 259.0 lb

## 2020-02-11 DIAGNOSIS — M109 Gout, unspecified: Secondary | ICD-10-CM

## 2020-02-11 DIAGNOSIS — Z131 Encounter for screening for diabetes mellitus: Secondary | ICD-10-CM

## 2020-02-11 DIAGNOSIS — E782 Mixed hyperlipidemia: Secondary | ICD-10-CM

## 2020-02-11 DIAGNOSIS — I10 Essential (primary) hypertension: Secondary | ICD-10-CM

## 2020-02-11 DIAGNOSIS — R109 Unspecified abdominal pain: Secondary | ICD-10-CM

## 2020-02-11 DIAGNOSIS — R140 Abdominal distension (gaseous): Secondary | ICD-10-CM

## 2020-02-11 MED ORDER — ALLOPURINOL 100 MG PO TABS: 100.0000 mg | ORAL_TABLET | Freq: Every day | ORAL | 3 refills | Status: DC

## 2020-02-11 MED ORDER — AMLODIPINE BESYLATE 10 MG PO TABS: 10.0000 mg | ORAL_TABLET | Freq: Every day | ORAL | 3 refills | Status: DC

## 2020-02-11 MED ORDER — OMEPRAZOLE 20 MG PO CPDR: 20.0000 mg | DELAYED_RELEASE_CAPSULE | Freq: Every day | ORAL | 3 refills | Status: DC

## 2020-02-11 MED ORDER — COLCHICINE 0.6 MG PO TABS: 0.6000 mg | ORAL_TABLET | Freq: Three times a day (TID) | ORAL | 6 refills | Status: DC | PRN

## 2020-02-11 MED ORDER — ROSUVASTATIN CALCIUM 10 MG PO TABS: 10.0000 mg | ORAL_TABLET | Freq: Every day | ORAL | 11 refills | Status: DC

## 2020-02-11 MED FILL — AMLODIPINE BESYLATE 10 MG T: 10 | 30 days supply | Qty: 30 | Fill #0

## 2020-02-11 MED FILL — COLCHICINE 0.6 MG TABS: 0.6 | 10 days supply | Qty: 30 | Fill #0

## 2020-02-11 MED FILL — ROSUVASTATIN CALCIUM 10 MG: 10 | 30 days supply | Qty: 30 | Fill #0

## 2020-02-11 MED FILL — ALLOPURINOL 100 MG TABLET: 100 | 30 days supply | Qty: 30 | Fill #0

## 2020-02-11 MED FILL — OMEPRAZOLE 20 MG CAP: 20 | 30 days supply | Qty: 30 | Fill #0

## 2020-02-11 NOTE — Patient Instructions (Addendum)
[facility]   [address], [place] [address]  Phone:  [phone] Fax:  [phone]   Hypertension, Adult Hypertension is another name for high blood pressure. High blood pressure forces your heart to work harder to pump blood. This can cause problems over time. There are two numbers in a blood pressure reading. There is a top number (systolic) over a bottom number (diastolic). It is best to have a blood pressure that is below 120/80. Healthy choices can help lower your blood pressure, or you may need medicine to help lower it. What are the causes? The cause of this condition is not known. Some conditions may be related to high blood pressure. What increases the risk?  Smoking.  Having type 2 diabetes mellitus, high cholesterol, or both.  Not getting enough exercise or physical activity.  Being overweight.  Having too much fat, sugar, calories, or salt (sodium) in your diet.  Drinking too much alcohol.  Having long-term (chronic) kidney disease.  Having a family history of high blood pressure.  Age. Risk increases with age.  Race. You may be at higher risk if you are African American.  Gender. Men are at higher risk than women before age 28. After age 32, women are at higher risk than men.  Having obstructive sleep apnea.  Stress. What are the signs or symptoms?  High blood pressure may not cause symptoms. Very high blood pressure (hypertensive crisis) may cause: ? Headache. ? Feelings of worry or nervousness (anxiety). ? Shortness of breath. ? Nosebleed. ? A feeling of being sick to your stomach (nausea). ? Throwing up (vomiting). ? Changes in how you see. ? Very bad chest pain. ? Seizures. How is this treated?  This condition is treated by making healthy lifestyle changes, such as: ? Eating healthy foods. ? Exercising more. ? Drinking less alcohol.  Your health care provider may prescribe medicine if lifestyle  changes are not enough to get your blood pressure under control, and if: ? Your top number is above 130. ? Your bottom number is above 80.  Your personal target blood pressure may vary. Follow these instructions at home: Eating and drinking   If told, follow the DASH eating plan. To follow this plan: ? Fill one half of your plate at each meal with fruits and vegetables. ? Fill one fourth of your plate at each meal with whole grains. Whole grains include whole-wheat pasta, brown rice, and whole-grain bread. ? Eat or drink low-fat dairy products, such as skim milk or low-fat yogurt. ? Fill one fourth of your plate at each meal with low-fat (lean) proteins. Low-fat proteins include fish, chicken without skin, eggs, beans, and tofu. ? Avoid fatty meat, cured and processed meat, or chicken with skin. ? Avoid pre-made or processed food.  Eat less than 1,500 mg of salt each day.  Do not drink alcohol if: ? Your doctor tells you not to drink. ? You are pregnant, may be pregnant, or are planning to become pregnant.  If you drink alcohol: ? Limit how much you use to:  0-1 drink a day for women.  0-2 drinks a day for men. ? Be aware of how much alcohol is in your drink. In the [place] one drink equals one 12 oz bottle of beer (355 mL), one 5 oz glass of wine (148 mL), or one 1 oz glass of hard liquor (44 mL). Lifestyle   Work with your doctor to stay at  a healthy weight or to lose weight. Ask your doctor what the best weight is for you.  Get at least 30 minutes of exercise most days of the week. This may include walking, swimming, or biking.  Get at least 30 minutes of exercise that strengthens your muscles (resistance exercise) at least 3 days a week. This may include lifting weights or doing Pilates.  Do not use any products that contain nicotine or tobacco, such as cigarettes, e-cigarettes, and chewing tobacco. If you need help quitting, ask your doctor.  Check your blood pressure at  home as told by your doctor.  Keep all follow-up visits as told by your doctor. This is important. Medicines  Take over-the-counter and prescription medicines only as told by your doctor. Follow directions carefully.  Do not skip doses of blood pressure medicine. The medicine does not work as well if you skip doses. Skipping doses also puts you at risk for problems.  Ask your doctor about side effects or reactions to medicines that you should watch for. Contact a doctor if you:  Think you are having a reaction to the medicine you are taking.  Have headaches that keep coming back (recurring).  Feel dizzy.  Have swelling in your ankles.  Have trouble with your vision. Get help right away if you:  Get a very bad headache.  Start to feel mixed up (confused).  Feel weak or numb.  Feel faint.  Have very bad pain in your: ? Chest. ? Belly (abdomen).  Throw up more than once.  Have trouble breathing. Summary  Hypertension is another name for high blood pressure.  High blood pressure forces your heart to work harder to pump blood.  For most people, a normal blood pressure is less than 120/80.  Making healthy choices can help lower blood pressure. If your blood pressure does not get lower with healthy choices, you may need to take medicine. This information is not intended to replace advice given to you by your health care provider. Make sure you discuss any questions you have with your health care provider. Document Revised: 10/01/2017 Document Reviewed: 10/01/2017 Elsevier Patient Education  2020 Elsevier Inc.  Abdominal Bloating When you have abdominal bloating, your abdomen may feel full, tight, or painful. It may also look bigger than normal or swollen (distended). Common causes of abdominal bloating include:  Swallowing air.  Constipation.  Problems digesting food.  Eating too much.  Irritable bowel syndrome. This is a condition that affects the large  intestine.  Lactose intolerance. This is an inability to digest lactose, a natural sugar in dairy products.  Celiac disease. This is a condition that affects the ability to digest gluten, a protein found in some grains.  Gastroparesis. This is a condition that slows down the movement of food in the stomach and small intestine. It is more common in people with diabetes mellitus.  Gastroesophageal reflux disease (GERD). This is a digestive condition that makes stomach acid flow back into the esophagus.  Urinary retention. This means that the body is holding onto urine, and the bladder cannot be emptied all the way. Follow these instructions at home: Eating and drinking  Avoid eating too much.  Try not to swallow air while talking or eating.  Avoid eating while lying down.  Avoid these foods and drinks: ? Foods that cause gas, such as broccoli, cabbage, cauliflower, and baked beans. ? Carbonated drinks. ? Hard candy. ? Chewing gum. Medicines  Take over-the-counter and prescription medicines only as  told by your health care provider.  Take probiotic medicines. These medicines contain live bacteria or yeasts that can help digestion.  Take coated peppermint oil capsules. Activity  Try to exercise regularly. Exercise may help to relieve bloating that is caused by gas and relieve constipation. General instructions  Keep all follow-up visits as told by your health care provider. This is important. Contact a health care provider if:  You have nausea and vomiting.  You have diarrhea.  You have abdominal pain.  You have unusual weight loss or weight gain.  You have severe pain, and medicines do not help. Get help right away if:  You have severe chest pain.  You have trouble breathing.  You have shortness of breath.  You have trouble urinating.  You have darker urine than normal.  You have blood in your stools or have dark, tarry stools. Summary  Abdominal bloating  means that the abdomen is swollen.  Common causes of abdominal bloating are swallowing air, constipation, and problems digesting food.  Avoid eating too much and avoid swallowing air.  Avoid foods that cause gas, carbonated drinks, hard candy, and chewing gum. This information is not intended to replace advice given to you by your health care provider. Make sure you discuss any questions you have with your health care provider. Document Revised: 05/11/2018 Document Reviewed: 02/23/2016 Elsevier Patient Education  [place].

## 2020-02-11 NOTE — Progress Notes (Signed)
[facility] [address] [place], [place]  [address] Phone:  [phone]   Fax:  [phone]   Established Patient Office Visit  Subjective:  Patient ID: Gene Reed, male    DOB: [birth year]  Age: 50 y.o. MRN: [id number]  CC:  Chief Complaint  Patient presents with  . Follow-up    3 month follow , abd cramping at time, need refill , hasn't been able b/p due to insurance     HPI Gene Reed presents for follow up. He  has a past medical history of Ankle joint pain, Arthritis, Bilateral chronic knee pain, Bone spur, Gout, Hypertension, Knee pain, and Urinary frequency (12/2018).   Abdominal Pain Patient complains of abdominal pain. The pain is described as sharp, and is varies in intensity. The patient is experiencing suprapubic pain without radiation. Onset was several weeks ago. Symptoms have been gradually worsening. Aggravating factors: none.  Alleviating factors: none. Associated symptoms: belching and flatus. The patient denies anorexia, arthralagias, dysuria, fever, headache, hematochezia, hematuria, melena, myalgias, nausea, sweats and vomiting. He has made some dietary changes because he has not had his medication. He has been eating more lettuce; iceberg with vegetables.  Hypertension Patient is here for follow-up of elevated blood pressure. He is not exercising and is adherent to a low-salt diet. Cardiac symptoms: none. Patient denies chest pain, dyspnea, exertional chest pressure/discomfort, irregular heart beat, lower extremity edema, palpitations and syncope. Cardiovascular risk factors: dyslipidemia, hypertension, male gender and obesity (BMI >= 30 kg/m2). Use of agents associated with hypertension: none. History of target organ damage: prior syncope. He has been off his medication for a few weeks due to finances.    Past Medical History:  Diagnosis Date  . Ankle joint pain   . Arthritis   . Bilateral chronic knee pain   . Bone spur   . Gout    . Hypertension   . Knee pain   . Urinary frequency 12/2018    Past Surgical History:  Procedure Laterality Date  . KNEE SURGERY      History reviewed. No pertinent family history.  Social History   Socioeconomic History  . Marital status: Single    Spouse name: Not on file  . Number of children: Not on file  . Years of education: Not on file  . Highest education level: Not on file  Occupational History  . Not on file  Tobacco Use  . Smoking status: Former Games developer  . Smokeless tobacco: Never Used  Vaping Use  . Vaping Use: Never used  Substance and Sexual Activity  . Alcohol use: Yes    Comment:  6 beers per a day   . Drug use: Yes    Types: Marijuana    Comment: 1 joint a day   . Sexual activity: Not Currently  Other Topics Concern  . Not on file  Social History Narrative  . Not on file   Social Determinants of Health   Financial Resource Strain: Not on file  Food Insecurity: Not on file  Transportation Needs: Not on file  Physical Activity: Not on file  Stress: Not on file  Social Connections: Not on file  Intimate Partner Violence: Not on file    Outpatient Medications Prior to Visit  Medication Sig Dispense Refill  . colchicine 0.6 MG tablet Take 1 tablet by mouth every 8 (eight) hours as needed.    Marland Kitchen omeprazole (PRILOSEC) 20 MG capsule Take 1 capsule (20 mg total)  by mouth daily. 90 capsule 3  . allopurinol (ZYLOPRIM) 100 MG tablet Take 1 tablet (100 mg total) by mouth daily. (Patient not taking: Reported on 02/11/2020) 90 tablet 3  . amLODipine (NORVASC) 10 MG tablet Take 1 tablet (10 mg total) by mouth daily. (Patient not taking: Reported on 02/11/2020) 90 tablet 3  . colchicine 0.6 MG tablet Take 1 tablet (0.6 mg total) by mouth every 8 (eight) hours as needed. For acute gouty attacks. (Patient not taking: Reported on 02/11/2020) 30 tablet 6  . rosuvastatin (CRESTOR) 10 MG tablet Take 1 tablet (10 mg total) by mouth daily. (Patient not taking: Reported on  02/11/2020) 30 tablet 11   No facility-administered medications prior to visit.    No Known Allergies  ROS Review of Systems    Objective:    Physical Exam Constitutional:      General: He is not in acute distress.    Appearance: He is obese. He is not ill-appearing, toxic-appearing or diaphoretic.  HENT:     Head: Normocephalic and atraumatic.  Cardiovascular:     Rate and Rhythm: Normal rate and regular rhythm.     Pulses: Normal pulses.     Heart sounds: Normal heart sounds.  Pulmonary:     Effort: Pulmonary effort is normal.     Breath sounds: Normal breath sounds.  Abdominal:     General: Bowel sounds are normal.     Palpations: Abdomen is soft.     Comments: hypoactive  Musculoskeletal:        General: Normal range of motion.     Cervical back: Normal range of motion.  Skin:    General: Skin is warm.     Capillary Refill: Capillary refill takes less than 2 seconds.  Neurological:     General: No focal deficit present.     Mental Status: He is alert and oriented to person, place, and time.  Psychiatric:        Mood and Affect: Mood normal.        Behavior: Behavior normal.        Thought Content: Thought content normal.        Judgment: Judgment normal.     BP (!) 142/87 (BP Location: Left Arm, Patient Position: Sitting, Cuff Size: Normal)   Pulse 82   Temp 97.9 F (36.6 C) (Temporal)   Ht 5\' 5"  (1.651 m)   Wt 259 lb (117.5 kg)   SpO2 96%   BMI 43.10 kg/m  Wt Readings from Last 3 Encounters:  02/11/20 259 lb (117.5 kg)  11/12/19 272 lb (123.4 kg)  08/19/19 256 lb 9.6 oz (116.4 kg)     There are no preventive care reminders to display for this patient.  There are no preventive care reminders to display for this patient.  Lab Results  Component Value Date   TSH 2.080 12/16/2018   Lab Results  Component Value Date   WBC 9.9 08/13/2019   HGB 12.9 (L) 08/13/2019   HCT 37.8 08/13/2019   MCV 93 08/13/2019   PLT 263 08/13/2019   Lab Results   Component Value Date   NA 141 11/12/2019   K 4.0 11/12/2019   CO2 22 02/17/2019   GLUCOSE 136 (H) 11/12/2019   BUN 9 11/12/2019   CREATININE 0.65 (L) 11/12/2019   BILITOT 0.4 11/12/2019   ALKPHOS 131 (H) 11/12/2019   AST 46 (H) 11/12/2019   ALT 54 (H) 02/17/2019   PROT 7.9 11/12/2019   ALBUMIN 4.5 11/12/2019  CALCIUM 8.9 11/12/2019   ANIONGAP 8 09/04/2015   Lab Results  Component Value Date   CHOL 206 (H) 11/12/2019   Lab Results  Component Value Date   HDL 36 (L) 11/12/2019   Lab Results  Component Value Date   LDLCALC 133 (H) 11/12/2019   Lab Results  Component Value Date   TRIG 205 (H) 11/12/2019   Lab Results  Component Value Date   CHOLHDL 5.7 (H) 11/12/2019   Lab Results  Component Value Date   HGBA1C 4.0 12/16/2018   HGBA1C 4.9 12/16/2018      Assessment & Plan:   Problem List Items Addressed This Visit      Musculoskeletal and Integument   Acute gout of foot   Relevant Medications   allopurinol (ZYLOPRIM) 100 MG tablet   colchicine 0.6 MG tablet    Other Visit Diagnoses    Essential hypertension    -  Primary Encouraged on going compliance with current medication regimen Encouraged home monitoring and recording BP <130/80 Continue to eat a heart-healthy diet with less salt Encouraged regular physical activity  Recommend more weight loss Will repeat fasting labs at next visit    Relevant Medications   amLODipine (NORVASC) 10 MG tablet   rosuvastatin (CRESTOR) 10 MG tablet   Other Relevant Orders   Comp. Metabolic Panel (12)   Mixed dyslipidemia       Relevant Medications   rosuvastatin (CRESTOR) 10 MG tablet   Screening for diabetes mellitus (DM)       Relevant Orders   POCT glucose (manual entry)  Abdominal bloating with cramps  Discussed dietary changes Education provided     Meds ordered this encounter  Medications  . amLODipine (NORVASC) 10 MG tablet    Sig: Take 1 tablet (10 mg total) by mouth daily.    Dispense:  90  tablet    Refill:  3    Order Specific Question:   Supervising Provider    Answer:   Quentin Angst [id number]  . rosuvastatin (CRESTOR) 10 MG tablet    Sig: Take 1 tablet (10 mg total) by mouth daily.    Dispense:  30 tablet    Refill:  11    Order Specific Question:   Supervising Provider    Answer:   Quentin Angst [id number]  . allopurinol (ZYLOPRIM) 100 MG tablet    Sig: Take 1 tablet (100 mg total) by mouth daily.    Dispense:  90 tablet    Refill:  3    Order Specific Question:   Supervising Provider    Answer:   Quentin Angst [id number]  . omeprazole (PRILOSEC) 20 MG capsule    Sig: Take 1 capsule (20 mg total) by mouth daily.    Dispense:  90 capsule    Refill:  3    Order Specific Question:   Supervising Provider    Answer:   Quentin Angst [id number]  . colchicine 0.6 MG tablet    Sig: Take 1 tablet (0.6 mg total) by mouth every 8 (eight) hours as needed. For acute gouty attacks.    Dispense:  30 tablet    Refill:  6    Order Specific Question:   Supervising Provider    Answer:   Quentin Angst [id number]    Follow-up: Return in about 3 months (around 05/11/2020) for follow up HTN ([id number]) [id number].    Barbette Merino, NP

## 2020-02-13 LAB — GLUCOSE, POCT (MANUAL RESULT ENTRY): POC Glucose: 100 mg/dl — AB (ref 70–99)

## 2020-03-27 MED FILL — ALLOPURINOL 100 MG TABLET: 100 | 30 days supply | Qty: 30 | Fill #1

## 2020-03-27 MED FILL — COLCHICINE 0.6 MG TABS: 0.6 | 10 days supply | Qty: 30 | Fill #1

## 2020-03-27 MED FILL — OMEPRAZOLE 20 MG CAP: 20 | 30 days supply | Qty: 30 | Fill #1

## 2020-03-27 MED FILL — AMLODIPINE BESYLATE 10 MG T: 10 | 30 days supply | Qty: 30 | Fill #1

## 2020-03-27 MED FILL — ROSUVASTATIN CALCIUM 10 MG: 10 | 30 days supply | Qty: 30 | Fill #1

## 2020-05-11 ENCOUNTER — Ambulatory Visit: Payer: Self-pay | Admitting: Nurse Practitioner

## 2020-06-19 ENCOUNTER — Telehealth: Payer: Self-pay

## 2020-06-19 ENCOUNTER — Other Ambulatory Visit: Payer: Self-pay | Admitting: Nurse Practitioner

## 2020-06-19 ENCOUNTER — Other Ambulatory Visit: Payer: Self-pay

## 2020-06-19 DIAGNOSIS — M109 Gout, unspecified: Secondary | ICD-10-CM

## 2020-06-19 MED FILL — Omeprazole Cap Delayed Release 20 MG: ORAL | 30 days supply | Qty: 30 | Fill #0 | Status: AC

## 2020-06-19 MED FILL — Amlodipine Besylate Tab 10 MG (Base Equivalent): ORAL | 30 days supply | Qty: 30 | Fill #0 | Status: AC

## 2020-06-19 MED FILL — Rosuvastatin Calcium Tab 10 MG: ORAL | 30 days supply | Qty: 30 | Fill #0 | Status: AC

## 2020-06-19 MED FILL — Allopurinol Tab 100 MG: ORAL | 30 days supply | Qty: 30 | Fill #0 | Status: AC

## 2020-06-19 MED FILL — Colchicine Tab 0.6 MG: ORAL | 10 days supply | Qty: 30 | Fill #0 | Status: AC

## 2020-06-19 NOTE — Telephone Encounter (Signed)
Error

## 2020-06-19 NOTE — Telephone Encounter (Signed)
Patient has plenty of refills, notified to contact pharmacy. Patient aware.

## 2020-06-19 NOTE — Telephone Encounter (Signed)
Pt said he will need his refills on all medicines and he will be by the pharmacy by 1pm but didn't want to wait an hoiur for his refills

## 2020-08-02 ENCOUNTER — Encounter: Payer: Self-pay | Admitting: Nurse Practitioner

## 2020-08-02 ENCOUNTER — Ambulatory Visit (INDEPENDENT_AMBULATORY_CARE_PROVIDER_SITE_OTHER): Payer: Self-pay | Admitting: Nurse Practitioner

## 2020-08-02 ENCOUNTER — Other Ambulatory Visit: Payer: Self-pay

## 2020-08-02 VITALS — BP 148/81 | HR 88 | Temp 97.5°F | Ht 69.0 in | Wt 263.1 lb

## 2020-08-02 DIAGNOSIS — M109 Gout, unspecified: Secondary | ICD-10-CM

## 2020-08-02 DIAGNOSIS — Z125 Encounter for screening for malignant neoplasm of prostate: Secondary | ICD-10-CM

## 2020-08-02 DIAGNOSIS — I10 Essential (primary) hypertension: Secondary | ICD-10-CM

## 2020-08-02 DIAGNOSIS — E782 Mixed hyperlipidemia: Secondary | ICD-10-CM

## 2020-08-02 MED ORDER — ROSUVASTATIN CALCIUM 10 MG PO TABS
10.0000 mg | ORAL_TABLET | Freq: Every day | ORAL | 3 refills | Status: DC
  Filled 2020-08-02: qty 30, 30d supply, fill #0
  Filled 2020-09-07: qty 30, 30d supply, fill #1
  Filled 2020-10-13: qty 30, 30d supply, fill #2
  Filled 2020-11-17: qty 30, 30d supply, fill #3
  Filled 2021-01-01: qty 30, 30d supply, fill #4
  Filled 2021-02-06: qty 30, 30d supply, fill #5
  Filled 2021-03-09: qty 30, 30d supply, fill #0
  Filled 2021-04-12: qty 30, 30d supply, fill #1
  Filled 2021-05-15: qty 30, 30d supply, fill #2
  Filled 2021-07-04: qty 30, 30d supply, fill #3

## 2020-08-02 MED ORDER — AMLODIPINE BESYLATE 10 MG PO TABS
ORAL_TABLET | Freq: Every day | ORAL | 3 refills | Status: DC
  Filled 2020-08-02: qty 30, 30d supply, fill #0
  Filled 2020-09-07: qty 30, 30d supply, fill #1
  Filled 2020-10-13: qty 30, 30d supply, fill #2
  Filled 2020-11-17: qty 30, 30d supply, fill #3
  Filled 2021-01-01: qty 30, 30d supply, fill #4
  Filled 2021-02-06: qty 30, 30d supply, fill #5
  Filled 2021-03-09: qty 30, 30d supply, fill #0
  Filled 2021-04-12: qty 30, 30d supply, fill #1
  Filled 2021-05-15: qty 90, 90d supply, fill #2

## 2020-08-02 MED ORDER — COLCHICINE 0.6 MG PO TABS
0.6000 mg | ORAL_TABLET | Freq: Every day | ORAL | 3 refills | Status: DC
Start: 2020-08-02 — End: 2021-08-20
  Filled 2020-08-02: qty 30, 30d supply, fill #0
  Filled 2020-09-07: qty 30, 30d supply, fill #1
  Filled 2020-10-13: qty 30, 30d supply, fill #2
  Filled 2020-11-17: qty 30, 30d supply, fill #3
  Filled 2021-01-01: qty 30, 30d supply, fill #4
  Filled 2021-02-06: qty 30, 30d supply, fill #5
  Filled 2021-03-09: qty 30, 30d supply, fill #0
  Filled 2021-04-12: qty 30, 30d supply, fill #1
  Filled 2021-05-15: qty 30, 30d supply, fill #2

## 2020-08-02 MED ORDER — ALLOPURINOL 100 MG PO TABS
ORAL_TABLET | Freq: Every day | ORAL | 3 refills | Status: DC
  Filled 2020-08-02: qty 30, 30d supply, fill #0
  Filled 2020-09-07: qty 30, 30d supply, fill #1
  Filled 2020-10-13: qty 30, 30d supply, fill #2
  Filled 2020-11-17: qty 30, 30d supply, fill #3
  Filled 2021-01-01: qty 30, 30d supply, fill #4
  Filled 2021-02-06: qty 30, 30d supply, fill #5
  Filled 2021-03-09: qty 30, 30d supply, fill #0
  Filled 2021-04-12: qty 30, 30d supply, fill #1
  Filled 2021-05-15: qty 30, 30d supply, fill #2
  Filled 2021-07-04: qty 30, 30d supply, fill #3

## 2020-08-02 MED ORDER — OMEPRAZOLE 20 MG PO CPDR
DELAYED_RELEASE_CAPSULE | Freq: Every day | ORAL | 3 refills | Status: DC
  Filled 2020-08-02: qty 30, 30d supply, fill #0
  Filled 2020-09-07: qty 30, 30d supply, fill #1
  Filled 2020-10-13: qty 30, 30d supply, fill #2
  Filled 2020-11-17: qty 30, 30d supply, fill #3
  Filled 2021-01-01: qty 30, 30d supply, fill #4
  Filled 2021-02-06: qty 30, 30d supply, fill #5
  Filled 2021-03-09: qty 30, 30d supply, fill #0
  Filled 2021-04-12: qty 30, 30d supply, fill #1
  Filled 2021-05-15: qty 30, 30d supply, fill #2
  Filled 2021-07-04: qty 30, 30d supply, fill #3

## 2020-08-02 NOTE — Patient Instructions (Signed)
Managing Your Hypertension Hypertension, also called high blood pressure, is when the force of the blood pressing against the walls of the arteries is too strong. Arteries are blood vessels that carry blood from your heart throughout your body. Hypertension forces the heart to work harder to pump blood and may cause the arteries tobecome narrow or stiff. Understanding blood pressure readings Your personal target blood pressure may vary depending on your medical conditions, your age, and other factors. A blood pressure reading includes a higher number over a lower number. Ideally, your blood pressure should be below 120/80. You should know that: The first, or top, number is called the systolic pressure. It is a measure of the pressure in your arteries as your heart beats. The second, or bottom number, is called the diastolic pressure. It is a measure of the pressure in your arteries as the heart relaxes. Blood pressure is classified into four stages. Based on your blood pressure reading, your health care provider may use the following stages to determine what type of treatment you need, if any. Systolic pressure and diastolicpressure are measured in a unit called mmHg. Normal Systolic pressure: below 120. Diastolic pressure: below 80. Elevated Systolic pressure: 120-129. Diastolic pressure: below 80. Hypertension stage 1 Systolic pressure: 130-139. Diastolic pressure: 80-89. Hypertension stage 2 Systolic pressure: 140 or above. Diastolic pressure: 90 or above. How can this condition affect me? Managing your hypertension is an important responsibility. Over time, hypertension can damage the arteries and decrease blood flow to important parts of the body, including the brain, heart, and kidneys. Having untreated or uncontrolled hypertension can lead to: A heart attack. A stroke. A weakened blood vessel (aneurysm). Heart failure. Kidney damage. Eye damage. Metabolic syndrome. Memory and  concentration problems. Vascular dementia. What actions can I take to manage this condition? Hypertension can be managed by making lifestyle changes and possibly by taking medicines. Your health care provider will help you make a plan to bring yourblood pressure within a normal range. Nutrition  Eat a diet that is high in fiber and potassium, and low in salt (sodium), added sugar, and fat. An example eating plan is called the Dietary Approaches to Stop Hypertension (DASH) diet. To eat this way: Eat plenty of fresh fruits and vegetables. Try to fill one-half of your plate at each meal with fruits and vegetables. Eat whole grains, such as whole-wheat pasta, brown rice, or whole-grain bread. Fill about one-fourth of your plate with whole grains. Eat low-fat dairy products. Avoid fatty cuts of meat, processed or cured meats, and poultry with skin. Fill about one-fourth of your plate with lean proteins such as fish, chicken without skin, beans, eggs, and tofu. Avoid pre-made and processed foods. These tend to be higher in sodium, added sugar, and fat. Reduce your daily sodium intake. Most people with hypertension should eat less than 1,500 mg of sodium a day.  Lifestyle  Work with your health care provider to maintain a healthy body weight or to lose weight. Ask what an ideal weight is for you. Get at least 30 minutes of exercise that causes your heart to beat faster (aerobic exercise) most days of the week. Activities may include walking, swimming, or biking. Include exercise to strengthen your muscles (resistance exercise), such as weight lifting, as part of your weekly exercise routine. Try to do these types of exercises for 30 minutes at least 3 days a week. Do not use any products that contain nicotine or tobacco, such as cigarettes, e-cigarettes, and chewing   tobacco. If you need help quitting, ask your health care provider. Control any long-term (chronic) conditions you have, such as high  cholesterol or diabetes. Identify your sources of stress and find ways to manage stress. This may include meditation, deep breathing, or making time for fun activities.  Alcohol use Do not drink alcohol if: Your health care provider tells you not to drink. You are pregnant, may be pregnant, or are planning to become pregnant. If you drink alcohol: Limit how much you use to: 0-1 drink a day for women. 0-2 drinks a day for men. Be aware of how much alcohol is in your drink. In the [place] one drink equals one 12 oz bottle of beer (355 mL), one 5 oz glass of wine (148 mL), or one 1 oz glass of hard liquor (44 mL). Medicines Your health care provider may prescribe medicine if lifestyle changes are not enough to get your blood pressure under control and if: Your systolic blood pressure is 130 or higher. Your diastolic blood pressure is 80 or higher. Take medicines only as told by your health care provider. Follow the directions carefully. Blood pressure medicines must be taken as told by your health care provider. The medicine does not work as well when you skip doses. Skippingdoses also puts you at risk for problems. Monitoring Before you monitor your blood pressure: Do not smoke, drink caffeinated beverages, or exercise within 30 minutes before taking a measurement. Use the bathroom and empty your bladder (urinate). Sit quietly for at least 5 minutes before taking measurements. Monitor your blood pressure at home as told by your health care provider. To do this: Sit with your back straight and supported. Place your feet flat on the floor. Do not cross your legs. Support your arm on a flat surface, such as a table. Make sure your upper arm is at heart level. Each time you measure, take two or three readings one minute apart and record the results. You may also need to have your blood pressure checked regularly by your healthcare provider. General information Talk with your health care  provider about your diet, exercise habits, and other lifestyle factors that may be contributing to hypertension. Review all the medicines you take with your health care provider because there may be side effects or interactions. Keep all visits as told by your health care provider. Your health care provider can help you create and adjust your plan for managing your high blood pressure. Where to find more information National Heart, Lung, and Blood Institute: www.nhlbi.nih.gov American Heart Association: www.heart.org Contact a health care provider if: You think you are having a reaction to medicines you have taken. You have repeated (recurrent) headaches. You feel dizzy. You have swelling in your ankles. You have trouble with your vision. Get help right away if: You develop a severe headache or confusion. You have unusual weakness or numbness, or you feel faint. You have severe pain in your chest or abdomen. You vomit repeatedly. You have trouble breathing. These symptoms may represent a serious problem that is an emergency. Do not wait to see if the symptoms will go away. Get medical help right away. Call your local emergency services (911 in the [place] Do not drive yourself to the hospital. Summary Hypertension is when the force of blood pumping through your arteries is too strong. If this condition is not controlled, it may put you at risk for serious complications. Your personal target blood pressure may vary depending on your medical conditions,   your age, and other factors. For most people, a normal blood pressure is less than 120/80. Hypertension is managed by lifestyle changes, medicines, or both. Lifestyle changes to help manage hypertension include losing weight, eating a healthy, low-sodium diet, exercising more, stopping smoking, and limiting alcohol. This information is not intended to replace advice given to you by your health care provider. Make sure you discuss any questions  you have with your healthcare provider. Document Revised: 02/26/2019 Document Reviewed: 12/22/2018 Elsevier Patient Education  2022 [employer]  

## 2020-08-02 NOTE — Progress Notes (Signed)
[facility] [address] [place], [place]  [address] Phone:  [phone]   Fax:  [phone]   Established Patient Office Visit  Subjective:  Patient ID: Gene Reed, male    DOB: 11-02-1970  Age: 50 y.o. MRN: [id number]  CC:  Chief Complaint  Patient presents with   Follow-up    Requesting refills on all med listed.     HPI Gene Reed presents for follow up. He  has a past medical history of Ankle joint pain, Arthritis, Bilateral chronic knee pain, Bone spur, Gout, Hypertension, Knee pain, and Urinary frequency (12/2018).   Hypertension Patient is here for follow-up of elevated blood pressure. He is not exercising and is adherent to a low-salt diet. Blood pressure is well controlled at home. Cardiac symptoms: none. Patient denies chest pain, chest pressure/discomfort, claudication, dyspnea, exertional chest pressure/discomfort, fatigue, irregular heart beat, lower extremity edema, near-syncope, orthopnea, palpitations, paroxysmal nocturnal dyspnea, syncope, and tachypnea. Cardiovascular risk factors: hypertension, male gender, obesity (BMI >= 30 kg/m2), and sedentary lifestyle. Use of agents associated with hypertension: none. History of target organ damage: none. He reports that he ran out of his medication.   Past Medical History:  Diagnosis Date   Ankle joint pain    Arthritis    Bilateral chronic knee pain    Bone spur    Gout    Hypertension    Knee pain    Urinary frequency 12/2018    Past Surgical History:  Procedure Laterality Date   KNEE SURGERY      History reviewed. No pertinent family history.  Social History   Socioeconomic History   Marital status: Single    Spouse name: Not on file   Number of children: Not on file   Years of education: Not on file   Highest education level: Not on file  Occupational History   Not on file  Tobacco Use   Smoking status: Former    Pack years: 0.00   Smokeless tobacco: Never  Vaping  Use   Vaping Use: Never used  Substance and Sexual Activity   Alcohol use: Yes    Comment:  6 beers per a day    Drug use: Yes    Types: Marijuana    Comment: 1 joint a day    Sexual activity: Not Currently  Other Topics Concern   Not on file  Social History Narrative   Not on file   Social Determinants of Health   Financial Resource Strain: Not on file  Food Insecurity: Not on file  Transportation Needs: Not on file  Physical Activity: Not on file  Stress: Not on file  Social Connections: Not on file  Intimate Partner Violence: Not on file    Outpatient Medications Prior to Visit  Medication Sig Dispense Refill   allopurinol (ZYLOPRIM) 100 MG tablet TAKE 1 TABLET (100 MG TOTAL) BY MOUTH DAILY. 90 tablet 3   amLODipine (NORVASC) 10 MG tablet TAKE 1 TABLET (10 MG TOTAL) BY MOUTH DAILY. 90 tablet 3   colchicine 0.6 MG tablet TAKE 1 TABLET (0.6 MG TOTAL) BY MOUTH EVERY 8 (EIGHT) HOURS AS NEEDED. FOR ACUTE GOUTY ATTACKS. 30 tablet 6   omeprazole (PRILOSEC) 20 MG capsule TAKE 1 CAPSULE (20 MG TOTAL) BY MOUTH DAILY. 90 capsule 3   rosuvastatin (CRESTOR) 10 MG tablet TAKE 1 TABLET (10 MG TOTAL) BY MOUTH DAILY. 30 tablet 11   No facility-administered medications prior to visit.    No  Known Allergies  ROS Review of Systems    Objective:    Physical Exam Constitutional:      Appearance: He is obese.  HENT:     Head: Normocephalic and atraumatic.     Nose: Nose normal.     Mouth/Throat:     Mouth: Mucous membranes are moist.  Cardiovascular:     Rate and Rhythm: Normal rate and regular rhythm.     Pulses: Normal pulses.     Heart sounds: Normal heart sounds.  Pulmonary:     Effort: Pulmonary effort is normal.     Breath sounds: Normal breath sounds.  Abdominal:     Palpations: Abdomen is soft.     Comments: Increased abdominal girth   Musculoskeletal:     Cervical back: Normal range of motion.     Right lower leg: No edema.     Left lower leg: No edema.   Skin:    General: Skin is warm and dry.     Capillary Refill: Capillary refill takes less than 2 seconds.  Neurological:     General: No focal deficit present.     Mental Status: He is alert and oriented to person, place, and time.  Psychiatric:        Mood and Affect: Mood normal.        Behavior: Behavior normal.        Thought Content: Thought content normal.        Judgment: Judgment normal.    BP (!) 148/81 (BP Location: Left Arm, Patient Position: Sitting)   Pulse 88   Temp (!) 97.5 F (36.4 C)   Ht 5\' 9"  (1.753 m)   Wt 263 lb 0.8 oz (119.3 kg)   SpO2 98%   BMI 38.85 kg/m  Wt Readings from Last 3 Encounters:  08/02/20 263 lb 0.8 oz (119.3 kg)  02/11/20 259 lb (117.5 kg)  11/12/19 272 lb (123.4 kg)     Health Maintenance Due  Topic Date Due   COVID-19 Vaccine (3 - Booster for Pfizer series) 12/25/2019   URINE MICROALBUMIN  08/12/2020    There are no preventive care reminders to display for this patient.  Lab Results  Component Value Date   TSH 2.080 12/16/2018   Lab Results  Component Value Date   WBC 9.9 08/13/2019   HGB 12.9 (L) 08/13/2019   HCT 37.8 08/13/2019   MCV 93 08/13/2019   PLT 263 08/13/2019   Lab Results  Component Value Date   NA 141 11/12/2019   K 4.0 11/12/2019   CO2 22 02/17/2019   GLUCOSE 136 (H) 11/12/2019   BUN 9 11/12/2019   CREATININE 0.65 (L) 11/12/2019   BILITOT 0.4 11/12/2019   ALKPHOS 131 (H) 11/12/2019   AST 46 (H) 11/12/2019   ALT 54 (H) 02/17/2019   PROT 7.9 11/12/2019   ALBUMIN 4.5 11/12/2019   CALCIUM 8.9 11/12/2019   ANIONGAP 8 09/04/2015   Lab Results  Component Value Date   CHOL 206 (H) 11/12/2019   Lab Results  Component Value Date   HDL 36 (L) 11/12/2019   Lab Results  Component Value Date   LDLCALC 133 (H) 11/12/2019   Lab Results  Component Value Date   TRIG 205 (H) 11/12/2019   Lab Results  Component Value Date   CHOLHDL 5.7 (H) 11/12/2019   Lab Results  Component Value Date   HGBA1C  4.0 12/16/2018   HGBA1C 4.9 12/16/2018      Assessment & Plan:  Problem List Items Addressed This Visit       Musculoskeletal and Integument   Acute gout of foot Stable as patient continues to be mindful of alcohol use and diet   Relevant Medications   allopurinol (ZYLOPRIM) 100 MG tablet   colchicine 0.6 MG tablet   Other Visit Diagnoses     Essential hypertension    -  Primary Stable continue with current regimen discussion about medication use   Relevant Medications   amLODipine (NORVASC) 10 MG tablet   rosuvastatin (CRESTOR) 10 MG tablet   Other Relevant Orders   Comp. Metabolic Panel (12) (Completed)   Mixed dyslipidemia     Unable to evaluate today due to eating prior to visit will complete fasting labs with next follow-up   Relevant Medications   rosuvastatin (CRESTOR) 10 MG tablet   Screening for prostate cancer       Relevant Orders   PSA (Completed)       Meds ordered this encounter  Medications   allopurinol (ZYLOPRIM) 100 MG tablet    Sig: TAKE 1 TABLET (100 MG TOTAL) BY MOUTH DAILY.    Dispense:  90 tablet    Refill:  3    Order Specific Question:   Supervising Provider    Answer:   Quentin Angst [[id number]]   amLODipine (NORVASC) 10 MG tablet    Sig: TAKE 1 TABLET (10 MG TOTAL) BY MOUTH DAILY.    Dispense:  90 tablet    Refill:  3    Order Specific Question:   Supervising Provider    Answer:   Quentin Angst [[id number]]   colchicine 0.6 MG tablet    Sig: Take 1 tablet (0.6 mg total) by mouth daily.    Dispense:  90 tablet    Refill:  3    Order Specific Question:   Supervising Provider    Answer:   Quentin Angst [[id number]]   omeprazole (PRILOSEC) 20 MG capsule    Sig: TAKE 1 CAPSULE (20 MG TOTAL) BY MOUTH DAILY.    Dispense:  90 capsule    Refill:  3    Order Specific Question:   Supervising Provider    Answer:   Quentin Angst [[id number]]   rosuvastatin (CRESTOR) 10 MG tablet    Sig: Take 1 tablet (10 mg total) by  mouth daily.    Dispense:  90 tablet    Refill:  3    Order Specific Question:   Supervising Provider    Answer:   Quentin Angst [id number]     Follow-up: Return in about 3 months (around 11/02/2020).    Barbette Merino, NP

## 2020-08-03 LAB — COMP. METABOLIC PANEL (12)
AST: 57 IU/L — ABNORMAL HIGH (ref 0–40)
Albumin/Globulin Ratio: 1.6 (ref 1.2–2.2)
Albumin: 4.6 g/dL (ref 4.0–5.0)
Alkaline Phosphatase: 132 IU/L — ABNORMAL HIGH (ref 44–121)
BUN/Creatinine Ratio: 17 (ref 9–20)
BUN: 12 mg/dL (ref 6–24)
Bilirubin Total: 0.5 mg/dL (ref 0.0–1.2)
Calcium: 9 mg/dL (ref 8.7–10.2)
Chloride: 105 mmol/L (ref 96–106)
Creatinine, Ser: 0.7 mg/dL — ABNORMAL LOW (ref 0.76–1.27)
Globulin, Total: 2.8 g/dL (ref 1.5–4.5)
Glucose: 106 mg/dL — ABNORMAL HIGH (ref 65–99)
Potassium: 3.9 mmol/L (ref 3.5–5.2)
Sodium: 140 mmol/L (ref 134–144)
Total Protein: 7.4 g/dL (ref 6.0–8.5)
eGFR: 113 mL/min/{1.73_m2} (ref >59)

## 2020-08-03 LAB — PSA: Prostate Specific Ag, Serum: 0.3 ng/mL (ref 0.0–4.0)

## 2020-08-04 ENCOUNTER — Other Ambulatory Visit: Payer: Self-pay

## 2020-09-07 ENCOUNTER — Other Ambulatory Visit: Payer: Self-pay

## 2020-10-13 ENCOUNTER — Other Ambulatory Visit: Payer: Self-pay

## 2020-11-17 ENCOUNTER — Other Ambulatory Visit: Payer: Self-pay

## 2021-01-01 ENCOUNTER — Other Ambulatory Visit: Payer: Self-pay

## 2021-01-05 ENCOUNTER — Ambulatory Visit (INDEPENDENT_AMBULATORY_CARE_PROVIDER_SITE_OTHER): Payer: Self-pay | Admitting: Nurse Practitioner

## 2021-01-05 ENCOUNTER — Encounter: Payer: Self-pay | Admitting: Nurse Practitioner

## 2021-01-05 ENCOUNTER — Other Ambulatory Visit: Payer: Self-pay

## 2021-01-05 VITALS — BP 146/83 | HR 86 | Temp 97.5°F | Ht 69.0 in | Wt 257.0 lb

## 2021-01-05 DIAGNOSIS — I10 Essential (primary) hypertension: Secondary | ICD-10-CM

## 2021-01-05 DIAGNOSIS — Z5989 Other problems related to housing and economic circumstances: Secondary | ICD-10-CM

## 2021-01-05 DIAGNOSIS — R29818 Other symptoms and signs involving the nervous system: Secondary | ICD-10-CM

## 2021-01-05 DIAGNOSIS — E782 Mixed hyperlipidemia: Secondary | ICD-10-CM

## 2021-01-05 DIAGNOSIS — M109 Gout, unspecified: Secondary | ICD-10-CM

## 2021-01-05 NOTE — Progress Notes (Signed)
[facility] Case Management Referral Note  01/05/2021 Name: Gene Reed MRN: [id number] DOB: 22-May-1970 Gene Reed is a 50 y.o. year old male who sees Gene Merino, NP for primary care. LCSW was consulted to assess patient's needs and assist the patient with Financial Difficulties related to low income and no health coverage .  Interpreter: No.   Interpreter Name & Language: none  Assessment: Patient experiencing Financial Difficulties related to low income and no health coverage  Intervention: Provided patient with [employer] and [employer] applications. Reviewed application and advised patient on supporting documents to be submitted with the application. Advised patient to follow up with CSW for assistance in scheduling appointment with financial counselor at [facility]) to submit the application. Provided CSW contact information.   Review of patient status, including review of consultants reports, relevant laboratory and other test results, and collaboration with appropriate care team members and the patient's provider was performed as part of comprehensive patient evaluation and provision of services.    Gene Butts, LCSW [facility] [phone]

## 2021-01-05 NOTE — Patient Instructions (Signed)
Managing Your Hypertension Hypertension, also called high blood pressure, is when the force of the blood pressing against the walls of the arteries is too strong. Arteries are blood vessels that carry blood from your heart throughout your body. Hypertension forces the heart to work harder to pump blood and may cause the arteries to become narrow or stiff. Understanding blood pressure readings Your personal target blood pressure may vary depending on your medical conditions, your age, and other factors. A blood pressure reading includes a higher number over a lower number. Ideally, your blood pressure should be below 120/80. You should know that: The first, or top, number is called the systolic pressure. It is a measure of the pressure in your arteries as your heart beats. The second, or bottom number, is called the diastolic pressure. It is a measure of the pressure in your arteries as the heart relaxes. Blood pressure is classified into four stages. Based on your blood pressure reading, your health care provider may use the following stages to determine what type of treatment you need, if any. Systolic pressure and diastolic pressure are measured in a unit called mmHg. Normal Systolic pressure: below 120. Diastolic pressure: below 80. Elevated Systolic pressure: 120-129. Diastolic pressure: below 80. Hypertension stage 1 Systolic pressure: 130-139. Diastolic pressure: 80-89. Hypertension stage 2 Systolic pressure: 140 or above. Diastolic pressure: 90 or above. How can this condition affect me? Managing your hypertension is an important responsibility. Over time, hypertension can damage the arteries and decrease blood flow to important parts of the body, including the brain, heart, and kidneys. Having untreated or uncontrolled hypertension can lead to: A heart attack. A stroke. A weakened blood vessel (aneurysm). Heart failure. Kidney damage. Eye damage. Metabolic syndrome. Memory and  concentration problems. Vascular dementia. What actions can I take to manage this condition? Hypertension can be managed by making lifestyle changes and possibly by taking medicines. Your health care provider will help you make a plan to bring your blood pressure within a normal range. Nutrition  Eat a diet that is high in fiber and potassium, and low in salt (sodium), added sugar, and fat. An example eating plan is called the Dietary Approaches to Stop Hypertension (DASH) diet. To eat this way: Eat plenty of fresh fruits and vegetables. Try to fill one-half of your plate at each meal with fruits and vegetables. Eat whole grains, such as whole-wheat pasta, brown rice, or whole-grain bread. Fill about one-fourth of your plate with whole grains. Eat low-fat dairy products. Avoid fatty cuts of meat, processed or cured meats, and poultry with skin. Fill about one-fourth of your plate with lean proteins such as fish, chicken without skin, beans, eggs, and tofu. Avoid pre-made and processed foods. These tend to be higher in sodium, added sugar, and fat. Reduce your daily sodium intake. Most people with hypertension should eat less than 1,500 mg of sodium a day. Lifestyle  Work with your health care provider to maintain a healthy body weight or to lose weight. Ask what an ideal weight is for you. Get at least 30 minutes of exercise that causes your heart to beat faster (aerobic exercise) most days of the week. Activities may include walking, swimming, or biking. Include exercise to strengthen your muscles (resistance exercise), such as weight lifting, as part of your weekly exercise routine. Try to do these types of exercises for 30 minutes at least 3 days a week. Do not use any products that contain nicotine or tobacco, such as cigarettes, e-cigarettes,   and chewing tobacco. If you need help quitting, ask your health care provider. Control any long-term (chronic) conditions you have, such as high  cholesterol or diabetes. Identify your sources of stress and find ways to manage stress. This may include meditation, deep breathing, or making time for fun activities. Alcohol use Do not drink alcohol if: Your health care provider tells you not to drink. You are pregnant, may be pregnant, or are planning to become pregnant. If you drink alcohol: Limit how much you use to: 0-1 drink a day for women. 0-2 drinks a day for men. Be aware of how much alcohol is in your drink. In the [place] one drink equals one 12 oz bottle of beer (355 mL), one 5 oz glass of wine (148 mL), or one 1 oz glass of hard liquor (44 mL). Medicines Your health care provider may prescribe medicine if lifestyle changes are not enough to get your blood pressure under control and if: Your systolic blood pressure is 130 or higher. Your diastolic blood pressure is 80 or higher. Take medicines only as told by your health care provider. Follow the directions carefully. Blood pressure medicines must be taken as told by your health care provider. The medicine does not work as well when you skip doses. Skipping doses also puts you at risk for problems. Monitoring Before you monitor your blood pressure: Do not smoke, drink caffeinated beverages, or exercise within 30 minutes before taking a measurement. Use the bathroom and empty your bladder (urinate). Sit quietly for at least 5 minutes before taking measurements. Monitor your blood pressure at home as told by your health care provider. To do this: Sit with your back straight and supported. Place your feet flat on the floor. Do not cross your legs. Support your arm on a flat surface, such as a table. Make sure your upper arm is at heart level. Each time you measure, take two or three readings one minute apart and record the results. You may also need to have your blood pressure checked regularly by your health care provider. General information Talk with your health care  provider about your diet, exercise habits, and other lifestyle factors that may be contributing to hypertension. Review all the medicines you take with your health care provider because there may be side effects or interactions. Keep all visits as told by your health care provider. Your health care provider can help you create and adjust your plan for managing your high blood pressure. Where to find more information National Heart, Lung, and Blood Institute: www.nhlbi.nih.gov American Heart Association: www.heart.org Contact a health care provider if: You think you are having a reaction to medicines you have taken. You have repeated (recurrent) headaches. You feel dizzy. You have swelling in your ankles. You have trouble with your vision. Get help right away if: You develop a severe headache or confusion. You have unusual weakness or numbness, or you feel faint. You have severe pain in your chest or abdomen. You vomit repeatedly. You have trouble breathing. These symptoms may represent a serious problem that is an emergency. Do not wait to see if the symptoms will go away. Get medical help right away. Call your local emergency services (911 in the [place] Do not drive yourself to the hospital. Summary Hypertension is when the force of blood pumping through your arteries is too strong. If this condition is not controlled, it may put you at risk for serious complications. Your personal target blood pressure may vary depending on   your medical conditions, your age, and other factors. For most people, a normal blood pressure is less than 120/80. Hypertension is managed by lifestyle changes, medicines, or both. Lifestyle changes to help manage hypertension include losing weight, eating a healthy, low-sodium diet, exercising more, stopping smoking, and limiting alcohol. This information is not intended to replace advice given to you by your health care provider. Make sure you discuss any questions  you have with your health care provider. Document Revised: 02/08/2019 Document Reviewed: 12/22/2018 Elsevier Patient Education  2022 Elsevier Inc.  

## 2021-01-05 NOTE — Progress Notes (Signed)
[place], [place]  [address] Phone:  [phone]   Fax:  [phone]   Established Patient Office Visit  Subjective:  Patient ID: Gene Reed, male    DOB: 04/27/1970  Age: 50 y.o. MRN: [id number]  CC:  Chief Complaint  Patient presents with   Follow-up    3 month follow up, patient stated he has felt a bit depressed he recently lost his wife to cancer on 11/13.    HPI Ronon L Burpee presents for follow up. He  has a past medical history of Ankle joint pain, Arthritis, Bilateral chronic knee pain, Bone spur, Gout, Hypertension, Knee pain, and Urinary frequency (12/2018).   He last visiit was 07/27/2020. His wife died from cancer recently. 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 He has not been eating. He denies any SI/HI thoughts. He is under increased domestic stress post the death of his spouse.   He is unsure if the is having SOB and chest discomfort which maybe related to stress.   Denies headache, dizziness, visual changes, shortness of breath, dyspnea on exertion, chest pain, nausea, vomiting or any edema.   He reports that he the Gout  has been controlled since July. He avoids foods that can cause flare ups.   He has suspected sleep apnea however due to a lack of insurance he has not had official testing.  Past Medical History:  Diagnosis Date   Ankle joint pain    Arthritis    Bilateral chronic knee pain    Bone spur    Gout    Hypertension    Knee pain    Urinary frequency 12/2018    Past Surgical History:  Procedure Laterality Date   KNEE SURGERY      History reviewed. No pertinent family history.  Social History   Socioeconomic History   Marital status: Widowed    Spouse name: Not on file   Number of children: Not on file   Years of education: Not on file   Highest education level: Not on file  Occupational History   Not on file  Tobacco Use   Smoking status: Former   Smokeless tobacco: Never  Vaping  Use   Vaping Use: Never used  Substance and Sexual Activity   Alcohol use: Yes    Comment:  6 beers per a day    Drug use: Yes    Types: Marijuana    Comment: 1 joint a day    Sexual activity: Not Currently  Other Topics Concern   Not on file  Social History Narrative   Not on file   Social Determinants of Health   Financial Resource Strain: Not on file  Food Insecurity: Not on file  Transportation Needs: Not on file  Physical Activity: Not on file  Stress: Not on file  Social Connections: Not on file  Intimate Partner Violence: Not on file    Outpatient Medications Prior to Visit  Medication Sig Dispense Refill   allopurinol (ZYLOPRIM) 100 MG tablet TAKE 1 TABLET (100 MG TOTAL) BY MOUTH DAILY. 90 tablet 3   amLODipine (NORVASC) 10 MG tablet TAKE 1 TABLET (10 MG TOTAL) BY MOUTH DAILY. 90 tablet 3   colchicine 0.6 MG tablet Take 1 tablet (0.6 mg total) by mouth daily. 90 tablet 3   omeprazole (PRILOSEC) 20 MG capsule TAKE 1 CAPSULE (20 MG TOTAL) BY MOUTH DAILY. 90 capsule 3   rosuvastatin (CRESTOR) 10 MG tablet  Take 1 tablet (10 mg total) by mouth daily. 90 tablet 3   No facility-administered medications prior to visit.    No Known Allergies  ROS Review of Systems    Objective:    Physical Exam Constitutional:      Appearance: He is obese.  HENT:     Head: Normocephalic and atraumatic.     Nose: Nose normal.     Mouth/Throat:     Mouth: Mucous membranes are moist.  Cardiovascular:     Rate and Rhythm: Normal rate and regular rhythm.     Pulses: Normal pulses.     Heart sounds: Normal heart sounds.  Pulmonary:     Effort: Pulmonary effort is normal.     Comments: Diminished  Abdominal:     Palpations: Abdomen is soft.     Comments: Hypoactive  Musculoskeletal:     Cervical back: Normal range of motion.     Right lower leg: No edema.     Left lower leg: No edema.  Skin:    General: Skin is warm.     Capillary Refill: Capillary refill takes less than 2  seconds.  Neurological:     General: No focal deficit present.     Mental Status: He is alert.  Psychiatric:        Mood and Affect: Mood normal.        Behavior: Behavior normal.        Thought Content: Thought content normal.   BP (!) 146/83   Pulse 86   Temp (!) 97.5 F (36.4 C)   Ht _0  (1.753 m)   Wt 257 lb (116.6 kg)   SpO2 96%   BMI 37.95 kg/m  Wt Readings from Last 3 Encounters:  01/05/21 257 lb (116.6 kg)  08/02/20 263 lb 0.8 oz (119.3 kg)  02/11/20 259 lb (117.5 kg)     Health Maintenance Due  Topic Date Due   Pneumococcal Vaccine 84-11 Years old (1 - PCV) Never done   COVID-19 Vaccine (3 - Booster for Pfizer series) 09/19/2019   URINE MICROALBUMIN  08/12/2020    There are no preventive care reminders to display for this patient.  Lab Results  Component Value Date   TSH 2.080 12/16/2018   Lab Results  Component Value Date   WBC 9.9 08/13/2019   HGB 12.9 (L) 08/13/2019   HCT 37.8 08/13/2019   MCV 93 08/13/2019   PLT 263 08/13/2019   Lab Results  Component Value Date   NA 140 08/02/2020   K 3.9 08/02/2020   CO2 22 02/17/2019   GLUCOSE 106 (H) 08/02/2020   BUN 12 08/02/2020   CREATININE 0.70 (L) 08/02/2020   BILITOT 0.5 08/02/2020   ALKPHOS 132 (H) 08/02/2020   AST 57 (H) 08/02/2020   ALT 54 (H) 02/17/2019   PROT 7.4 08/02/2020   ALBUMIN 4.6 08/02/2020   CALCIUM 9.0 08/02/2020   ANIONGAP 8 09/04/2015   EGFR 113 08/02/2020   Lab Results  Component Value Date   CHOL 206 (H) 11/12/2019   Lab Results  Component Value Date   HDL 36 (L) 11/12/2019   Lab Results  Component Value Date   LDLCALC 133 (H) 11/12/2019   Lab Results  Component Value Date   TRIG 205 (H) 11/12/2019   Lab Results  Component Value Date   CHOLHDL 5.7 (H) 11/12/2019   Lab Results  Component Value Date   HGBA1C 4.0 12/16/2018   HGBA1C 4.9 12/16/2018  Assessment & Plan:   Problem List Items Addressed This Visit       Musculoskeletal and Integument    Acute gout of foot Stable  Continue with current regimen.  No changes warranted. Good patient compliance.    Other Visit Diagnoses     Essential hypertension    -  Primary Stable Encouraged on going compliance with current medication regimen Encouraged home monitoring and recording BP <130/80 Eating a heart-healthy diet with less salt Encouraged regular physical activity  Recommend Weight loss     Relevant Orders   Comp. Metabolic Panel (12)   Mixed dyslipidemia     Persistent  Continue with current regimen.   Heart healthy diet    Relevant Orders   Lipid panel   Suspected sleep apnea       Relevant Orders   Home sleep test   Does not have health insurance     Referral to CSW for assistance with [facility] card       No orders of the defined types were placed in this encounter.   Follow-up: Return in about 3 months (around 04/05/2021) for Follow up HTN [id number].

## 2021-01-06 LAB — COMP. METABOLIC PANEL (12)
AST: 65 IU/L — ABNORMAL HIGH (ref 0–40)
Albumin/Globulin Ratio: 1.4 (ref 1.2–2.2)
Albumin: 4.7 g/dL (ref 4.0–5.0)
Alkaline Phosphatase: 164 IU/L — ABNORMAL HIGH (ref 44–121)
BUN/Creatinine Ratio: 11 (ref 9–20)
BUN: 7 mg/dL (ref 6–24)
Bilirubin Total: 0.4 mg/dL (ref 0.0–1.2)
Calcium: 9.1 mg/dL (ref 8.7–10.2)
Chloride: 103 mmol/L (ref 96–106)
Creatinine, Ser: 0.64 mg/dL — ABNORMAL LOW (ref 0.76–1.27)
Globulin, Total: 3.4 g/dL (ref 1.5–4.5)
Glucose: 83 mg/dL (ref 70–99)
Potassium: 4.1 mmol/L (ref 3.5–5.2)
Sodium: 141 mmol/L (ref 134–144)
Total Protein: 8.1 g/dL (ref 6.0–8.5)
eGFR: 116 mL/min/{1.73_m2} (ref >59)

## 2021-01-06 LAB — LIPID PANEL
Chol/HDL Ratio: 3.5 ratio (ref 0.0–5.0)
Cholesterol, Total: 152 mg/dL (ref 100–199)
HDL: 43 mg/dL (ref >39)
LDL Chol Calc (NIH): 81 mg/dL (ref 0–99)
Triglycerides: 160 mg/dL — ABNORMAL HIGH (ref 0–149)
VLDL Cholesterol Cal: 28 mg/dL (ref 5–40)

## 2021-02-06 ENCOUNTER — Other Ambulatory Visit: Payer: Self-pay

## 2021-03-09 ENCOUNTER — Other Ambulatory Visit (HOSPITAL_COMMUNITY): Payer: Self-pay

## 2021-03-09 ENCOUNTER — Other Ambulatory Visit: Payer: Self-pay

## 2021-04-06 ENCOUNTER — Other Ambulatory Visit: Payer: Self-pay

## 2021-04-06 ENCOUNTER — Encounter: Payer: Self-pay | Admitting: Nurse Practitioner

## 2021-04-06 ENCOUNTER — Ambulatory Visit (INDEPENDENT_AMBULATORY_CARE_PROVIDER_SITE_OTHER): Payer: Self-pay | Admitting: Nurse Practitioner

## 2021-04-06 VITALS — BP 158/85 | HR 83 | Temp 97.5°F | Ht 69.0 in | Wt 267.0 lb

## 2021-04-06 DIAGNOSIS — I10 Essential (primary) hypertension: Secondary | ICD-10-CM | POA: Insufficient documentation

## 2021-04-06 DIAGNOSIS — E782 Mixed hyperlipidemia: Secondary | ICD-10-CM

## 2021-04-06 DIAGNOSIS — Z1211 Encounter for screening for malignant neoplasm of colon: Secondary | ICD-10-CM

## 2021-04-06 DIAGNOSIS — Z1329 Encounter for screening for other suspected endocrine disorder: Secondary | ICD-10-CM

## 2021-04-06 DIAGNOSIS — M109 Gout, unspecified: Secondary | ICD-10-CM

## 2021-04-06 NOTE — Progress Notes (Signed)
? ?[place] ?[place], [place]  [address] ?Phone:  [phone]   Fax:  [phone] ? ? ?Established Patient Office Visit ? ?Subjective:  ?Patient ID: Gene Reed, male    DOB: [birth year]  Age: 51 y.o. MRN: [id number] ? ?CC:  ?Chief Complaint  ?Patient presents with  ?? Follow-up  ?  Pt is here today for his 3 month follow up visit and to discuss the mucus and cough Gene Reed has been having in the morning when Gene Reed wakes up. Pt states that this has been going on for over a month now. Pt also needs medications refills as well.  ? ? ?HPI ?Gene Reed presents for follow up. She  has a past medical history of Ankle joint pain, Arthritis, Bilateral chronic knee pain, Bone spur, Gout, Hypertension, Knee pain, and Urinary frequency (12/2018).  ? ?Gene Reed Gene Reed is in today for follow up for Hypertension. The current prescribed treatment is amlodipine 10 mg  Compliance is reported and home blood pressure monitoring is not done . ?The  DASH diet is not being followed. An exercise regimen is not ongoing. There is a goal to lose weight and regulate BP. Denies headache, dizziness, visual changes, shortness of breath, dyspnea on exertion, chest pain, nausea, vomiting or any edema.   ? ?Past Medical History:  ?Diagnosis Date  ?? Ankle joint pain   ?? Arthritis   ?? Bilateral chronic knee pain   ?? Bone spur   ?? Gout   ?? Hypertension   ?? Knee pain   ?? Urinary frequency 12/2018  ? ? ?Past Surgical History:  ?Procedure Laterality Date  ?? KNEE SURGERY    ? ? ?History reviewed. No pertinent family history. ? ?Social History  ? ?Socioeconomic History  ?? Marital status: Widowed  ?  Spouse name: Not on file  ?? Number of children: Not on file  ?? Years of education: Not on file  ?? Highest education level: Not on file  ?Occupational History  ?? Not on file  ?Tobacco Use  ?? Smoking status: Former  ?? Smokeless tobacco: Never  ?Vaping Use  ?? Vaping Use: Never used  ?Substance and Sexual Activity  ??  Alcohol use: Yes  ?  Comment:  6 beers per a day   ?? Drug use: Yes  ?  Types: Marijuana  ?  Comment: 1 joint a day   ?? Sexual activity: Not Currently  ?Other Topics Concern  ?? Not on file  ?Social History Narrative  ?? Not on file  ? ?Social Determinants of Health  ? ?Financial Resource Strain: Not on file  ?Food Insecurity: Not on file  ?Transportation Needs: Not on file  ?Physical Activity: Not on file  ?Stress: Not on file  ?Social Connections: Not on file  ?Intimate Partner Violence: Not on file  ? ? ?Outpatient Medications Prior to Visit  ?Medication Sig Dispense Refill  ?? allopurinol (ZYLOPRIM) 100 MG tablet TAKE 1 TABLET (100 MG TOTAL) BY MOUTH DAILY. 90 tablet 3  ?? amLODipine (NORVASC) 10 MG tablet TAKE 1 TABLET (10 MG TOTAL) BY MOUTH DAILY. 90 tablet 3  ?? colchicine 0.6 MG tablet Take 1 tablet (0.6 mg total) by mouth daily. 90 tablet 3  ?? omeprazole (PRILOSEC) 20 MG capsule TAKE 1 CAPSULE (20 MG TOTAL) BY MOUTH DAILY. 90 capsule 3  ?? rosuvastatin (CRESTOR) 10 MG tablet Take 1 tablet (10 mg total) by mouth daily. 90 tablet 3  ? ?No facility-administered  medications prior to visit.  ? ? ?No Known Allergies ? ?ROS ?Review of Systems ? ?  ?Objective:  ?  ?Physical Exam ?Constitutional:   ?   Appearance: Gene Reed is obese.  ?HENT:  ?   Head: Normocephalic and atraumatic.  ?   Nose: Nose normal.  ?   Mouth/Throat:  ?   Mouth: Mucous membranes are moist.  ?Cardiovascular:  ?   Rate and Rhythm: Normal rate and regular rhythm.  ?   Pulses: Normal pulses.  ?   Heart sounds: Normal heart sounds.  ?Pulmonary:  ?   Effort: Pulmonary effort is normal.  ?   Breath sounds: Normal breath sounds.  ?Musculoskeletal:     ?   General: Normal range of motion.  ?   Cervical back: Normal range of motion.  ?Skin: ?   General: Skin is warm.  ?   Capillary Refill: Capillary refill takes less than 2 seconds.  ?Neurological:  ?   General: No focal deficit present.  ?   Mental Status: Gene Reed is alert and oriented to person, place, and  time.  ?Psychiatric:     ?   Mood and Affect: Mood normal.     ?   Behavior: Behavior normal.     ?   Thought Content: Thought content normal.     ?   Judgment: Judgment normal.  ? ? ?BP (!) 158/85   Pulse 83   Temp (!) 97.5 ?F (36.4 ?C)   Ht $R'5\' 9"'BK$  (1.753 m)   Wt 267 lb (121.1 kg)   SpO2 95%   BMI 39.43 kg/m?  ?Wt Readings from Last 3 Encounters:  ?04/06/21 267 lb (121.1 kg)  ?01/05/21 257 lb (116.6 kg)  ?08/02/20 263 lb 0.8 oz (119.3 kg)  ? ? ? ?Health Maintenance Due  ?Topic Date Due  ?? COLONOSCOPY (Pts 45-68yrs Insurance coverage will need to be confirmed)  Never done  ?? URINE MICROALBUMIN  08/12/2020  ? ? ?There are no preventive care reminders to display for this patient. ? ?Lab Results  ?Component Value Date  ? TSH 2.080 12/16/2018  ? ?Lab Results  ?Component Value Date  ? WBC 9.9 08/13/2019  ? HGB 12.9 (L) 08/13/2019  ? HCT 37.8 08/13/2019  ? MCV 93 08/13/2019  ? PLT 263 08/13/2019  ? ?Lab Results  ?Component Value Date  ? NA 141 01/05/2021  ? K 4.1 01/05/2021  ? CO2 22 02/17/2019  ? GLUCOSE 83 01/05/2021  ? BUN 7 01/05/2021  ? CREATININE 0.64 (L) 01/05/2021  ? BILITOT 0.4 01/05/2021  ? ALKPHOS 164 (H) 01/05/2021  ? AST 65 (H) 01/05/2021  ? ALT 54 (H) 02/17/2019  ? PROT 8.1 01/05/2021  ? ALBUMIN 4.7 01/05/2021  ? CALCIUM 9.1 01/05/2021  ? ANIONGAP 8 09/04/2015  ? EGFR 116 01/05/2021  ? ?Lab Results  ?Component Value Date  ? CHOL 152 01/05/2021  ? ?Lab Results  ?Component Value Date  ? HDL 43 01/05/2021  ? ?Lab Results  ?Component Value Date  ? [place] 81 01/05/2021  ? ?Lab Results  ?Component Value Date  ? TRIG 160 (H) 01/05/2021  ? ?Lab Results  ?Component Value Date  ? CHOLHDL 3.5 01/05/2021  ? ?Lab Results  ?Component Value Date  ? HGBA1C 4.0 12/16/2018  ? HGBA1C 4.9 12/16/2018  ? ? ?  ?Assessment & Plan:  ? ?Problem List Items Addressed This Visit   ? ?  ? Cardiovascular and Mediastinum  ? Essential hypertension - Primary ?Stable  ?Encouraged  on going compliance with current medication  regimen ?Encouraged home monitoring and recording BP <130/80 ?Eating a heart-healthy diet with less salt ?Encouraged regular physical activity  ?Recommend Weight loss ?  ? Relevant Orders  ? Comp. Metabolic Panel (12)  ? Microalbumin/Creatinine Ratio, Urine  ?  ? Musculoskeletal and Integument  ? Acute gout of foot  ? Relevant Orders  ? Microalbumin/Creatinine Ratio, Urine  ?  ? Other  ? Mixed dyslipidemia ?.  ? Relevant Orders  ? Lipid panel  ? ?Other Visit Diagnoses   ? ? Colon cancer screening      ? Relevant Orders  ? Cologuard  ? Screening for thyroid disorder      ? Relevant Orders  ? TSH  ? ?  ? ? ?No orders of the defined types were placed in this encounter. ? ? ?Follow-up: No follow-ups on file.  ? ? ?Vevelyn Francois, NP ?

## 2021-04-07 LAB — COMP. METABOLIC PANEL (12)
AST: 56 IU/L — ABNORMAL HIGH (ref 0–40)
Albumin/Globulin Ratio: 1.4 (ref 1.2–2.2)
Albumin: 4.6 g/dL (ref 4.0–5.0)
Alkaline Phosphatase: 156 IU/L — ABNORMAL HIGH (ref 44–121)
BUN/Creatinine Ratio: 13 (ref 9–20)
BUN: 9 mg/dL (ref 6–24)
Bilirubin Total: 0.4 mg/dL (ref 0.0–1.2)
Calcium: 9.2 mg/dL (ref 8.7–10.2)
Chloride: 104 mmol/L (ref 96–106)
Creatinine, Ser: 0.69 mg/dL — ABNORMAL LOW (ref 0.76–1.27)
Globulin, Total: 3.4 g/dL (ref 1.5–4.5)
Glucose: 84 mg/dL (ref 70–99)
Potassium: 4.2 mmol/L (ref 3.5–5.2)
Sodium: 141 mmol/L (ref 134–144)
Total Protein: 8 g/dL (ref 6.0–8.5)
eGFR: 113 mL/min/{1.73_m2} (ref >59)

## 2021-04-07 LAB — LIPID PANEL
Chol/HDL Ratio: 4.3 ratio (ref 0.0–5.0)
Cholesterol, Total: 181 mg/dL (ref 100–199)
HDL: 42 mg/dL (ref >39)
LDL Chol Calc (NIH): 97 mg/dL (ref 0–99)
Triglycerides: 245 mg/dL — ABNORMAL HIGH (ref 0–149)
VLDL Cholesterol Cal: 42 mg/dL — ABNORMAL HIGH (ref 5–40)

## 2021-04-07 LAB — TSH: TSH: 2.57 u[IU]/mL (ref 0.450–4.500)

## 2021-04-12 ENCOUNTER — Other Ambulatory Visit: Payer: Self-pay

## 2021-04-13 ENCOUNTER — Other Ambulatory Visit: Payer: Self-pay

## 2021-04-18 ENCOUNTER — Other Ambulatory Visit: Payer: Self-pay

## 2021-05-15 ENCOUNTER — Other Ambulatory Visit: Payer: Self-pay

## 2021-05-17 ENCOUNTER — Other Ambulatory Visit: Payer: Self-pay

## 2021-06-22 ENCOUNTER — Other Ambulatory Visit: Payer: Self-pay

## 2021-07-04 ENCOUNTER — Other Ambulatory Visit: Payer: Self-pay

## 2021-07-09 ENCOUNTER — Ambulatory Visit: Payer: Self-pay | Admitting: Nurse Practitioner

## 2021-07-12 ENCOUNTER — Other Ambulatory Visit: Payer: Self-pay

## 2021-08-20 ENCOUNTER — Other Ambulatory Visit: Payer: Self-pay | Admitting: Nurse Practitioner

## 2021-08-20 ENCOUNTER — Other Ambulatory Visit: Payer: Self-pay

## 2021-08-20 ENCOUNTER — Other Ambulatory Visit: Payer: Self-pay | Admitting: Family Medicine

## 2021-08-20 ENCOUNTER — Telehealth: Payer: Self-pay

## 2021-08-20 DIAGNOSIS — E782 Mixed hyperlipidemia: Secondary | ICD-10-CM

## 2021-08-20 DIAGNOSIS — M109 Gout, unspecified: Secondary | ICD-10-CM

## 2021-08-20 MED ORDER — OMEPRAZOLE 20 MG PO CPDR
DELAYED_RELEASE_CAPSULE | Freq: Every day | ORAL | 3 refills | Status: DC
  Filled 2021-08-20: qty 30, 30d supply, fill #0
  Filled 2021-09-27: qty 30, 30d supply, fill #1
  Filled 2021-10-26: qty 90, 90d supply, fill #2

## 2021-08-20 MED ORDER — ROSUVASTATIN CALCIUM 10 MG PO TABS
10.0000 mg | ORAL_TABLET | Freq: Every day | ORAL | 3 refills | Status: DC
  Filled 2021-08-20: qty 30, 30d supply, fill #0
  Filled 2021-09-27: qty 30, 30d supply, fill #1
  Filled 2021-10-26: qty 30, 30d supply, fill #2

## 2021-08-20 MED ORDER — COLCHICINE 0.6 MG PO TABS
0.6000 mg | ORAL_TABLET | Freq: Every day | ORAL | 3 refills | Status: DC
  Filled 2021-08-20: qty 90, 90d supply, fill #0

## 2021-08-20 MED ORDER — AMLODIPINE BESYLATE 10 MG PO TABS
ORAL_TABLET | Freq: Every day | ORAL | 3 refills | Status: DC
  Filled 2021-08-20: qty 30, 30d supply, fill #0
  Filled 2021-09-27: qty 30, 30d supply, fill #1
  Filled 2021-10-26: qty 30, 30d supply, fill #2
  Filled 2021-12-07: qty 30, 30d supply, fill #3
  Filled 2022-01-04: qty 30, 30d supply, fill #4

## 2021-08-20 MED ORDER — ALLOPURINOL 100 MG PO TABS
ORAL_TABLET | Freq: Every day | ORAL | 3 refills | Status: DC
  Filled 2021-08-20: qty 30, 30d supply, fill #0
  Filled 2021-09-27: qty 30, 30d supply, fill #1
  Filled 2021-11-16: qty 30, 30d supply, fill #2
  Filled 2021-12-07: qty 30, 30d supply, fill #3
  Filled 2022-02-08: qty 90, 90d supply, fill #4

## 2021-08-20 NOTE — Telephone Encounter (Signed)
Pt  has ran out of ALL med's    BP med's Acid reflux  Gout med Cholesterol

## 2021-08-20 NOTE — Progress Notes (Signed)
Meds ordered this encounter  Medications   amLODipine (NORVASC) 10 MG tablet    Sig: TAKE 1 TABLET (10 MG TOTAL) BY MOUTH DAILY.    Dispense:  90 tablet    Refill:  3    Order Specific Question:   Supervising Provider    Answer:   Quentin Angst [[id number]]   omeprazole (PRILOSEC) 20 MG capsule    Sig: TAKE 1 CAPSULE (20 MG TOTAL) BY MOUTH DAILY.    Dispense:  90 capsule    Refill:  3    Order Specific Question:   Supervising Provider    Answer:   Quentin Angst [[id number]]   allopurinol (ZYLOPRIM) 100 MG tablet    Sig: TAKE 1 TABLET (100 MG TOTAL) BY MOUTH DAILY.    Dispense:  90 tablet    Refill:  3    Order Specific Question:   Supervising Provider    Answer:   Quentin Angst [[id number]]   colchicine 0.6 MG tablet    Sig: Take 1 tablet (0.6 mg total) by mouth daily.    Dispense:  90 tablet    Refill:  3    Order Specific Question:   Supervising Provider    Answer:   Quentin Angst [[id number]]   rosuvastatin (CRESTOR) 10 MG tablet    Sig: Take 1 tablet (10 mg total) by mouth daily.    Dispense:  90 tablet    Refill:  3    Order Specific Question:   Supervising Provider    Answer:   Quentin Angst [[id number]]   [name]  APRN, MSN, FNP-C [facility] [address] [place], [place] [address] [phone]

## 2021-08-21 ENCOUNTER — Telehealth: Payer: Self-pay

## 2021-08-21 ENCOUNTER — Other Ambulatory Visit: Payer: Self-pay

## 2021-08-21 DIAGNOSIS — E782 Mixed hyperlipidemia: Secondary | ICD-10-CM

## 2021-08-21 DIAGNOSIS — M109 Gout, unspecified: Secondary | ICD-10-CM

## 2021-08-28 NOTE — Telephone Encounter (Signed)
No additional notes needed  

## 2021-08-30 ENCOUNTER — Other Ambulatory Visit: Payer: Self-pay

## 2021-08-30 ENCOUNTER — Ambulatory Visit (INDEPENDENT_AMBULATORY_CARE_PROVIDER_SITE_OTHER): Payer: Self-pay | Admitting: Nurse Practitioner

## 2021-08-30 ENCOUNTER — Encounter: Payer: Self-pay | Admitting: Nurse Practitioner

## 2021-08-30 VITALS — BP 132/85 | HR 80 | Temp 97.6°F | Ht 69.0 in | Wt 267.0 lb

## 2021-08-30 DIAGNOSIS — R079 Chest pain, unspecified: Secondary | ICD-10-CM | POA: Insufficient documentation

## 2021-08-30 DIAGNOSIS — I10 Essential (primary) hypertension: Secondary | ICD-10-CM

## 2021-08-30 MED ORDER — AMOXICILLIN 875 MG PO TABS
875.0000 mg | ORAL_TABLET | Freq: Two times a day (BID) | ORAL | 0 refills | Status: AC
  Filled 2021-08-30: qty 20, 10d supply, fill #0

## 2021-08-30 NOTE — Progress Notes (Signed)
@Patient  ID: , male    DOB: 01-Feb-1971, 51 y.o.   MRN: [id number]  Chief Complaint  Patient presents with   Follow-up    Pt is here for 3 month follow up. Pt states he has heart burns and throat to close up on him randomly. Pt states its been a year with this issue.     Referring provider: [id number], NP   HPI   Gene Reed presents for follow up. She  has a past medical history of Ankle joint pain, Arthritis, Bilateral chronic knee pain, Bone spur, Gout, Hypertension, Knee pain, and Urinary frequency (12/2018).    Gene Reed is in today for follow up for Hypertension. The current prescribed treatment is amlodipine 10 mg  Compliance is reported and home blood pressure monitoring is not done. Trying to eat healthier. There is a goal to lose weight and regulate BP. Patient does smoke and drink daily.   Patient complains today of heart burn and feeling of throat closing up at times. He states that this has been an issue for 1 year now. He is on omeprazole daily. Patient states that symptoms are improving. Will place referral to cardiology.   Denies headache, dizziness, visual changes, shortness of breath, dyspnea on exertion, chest pain, nausea, vomiting or any edema.       No Known Allergies  Immunization History  Administered Date(s) Administered   PFIZER(Purple Top)SARS-COV-2 Vaccination 06/22/2019, 07/25/2019   Tdap 09/21/2015    Past Medical History:  Diagnosis Date   Ankle joint pain    Arthritis    Bilateral chronic knee pain    Bone spur    Gout    Hypertension    Knee pain    Urinary frequency 12/2018    Tobacco History: Social History   Tobacco Use  Smoking Status Former  Smokeless Tobacco Never   Counseling given: Not Answered   Outpatient Encounter Medications as of 08/30/2021  Medication Sig   allopurinol (ZYLOPRIM) 100 MG tablet TAKE 1 TABLET (100 MG TOTAL) BY MOUTH DAILY.   amLODipine (NORVASC) 10 MG tablet TAKE 1 TABLET (10  MG TOTAL) BY MOUTH DAILY.   amoxicillin (AMOXIL) 875 MG tablet Take 1 tablet (875 mg total) by mouth 2 (two) times daily for 10 days.   colchicine 0.6 MG tablet Take 1 tablet (0.6 mg total) by mouth daily.   omeprazole (PRILOSEC) 20 MG capsule TAKE 1 CAPSULE (20 MG TOTAL) BY MOUTH DAILY.   rosuvastatin (CRESTOR) 10 MG tablet Take 1 tablet (10 mg total) by mouth daily.   No facility-administered encounter medications on file as of 08/30/2021.     Review of Systems  Review of Systems  Constitutional: Negative.   HENT: Negative.    Cardiovascular: Negative.   Gastrointestinal: Negative.   Allergic/Immunologic: Negative.   Neurological: Negative.   Psychiatric/Behavioral: Negative.         Physical Exam  BP 132/85 (BP Location: Left Arm, Patient Position: Sitting, Cuff Size: Large)   Pulse 80   Temp 97.6 F (36.4 C)   Ht 5\' 9"  (1.753 m)   Wt 267 lb (121.1 kg)   SpO2 99%   BMI 39.43 kg/m   Wt Readings from Last 5 Encounters:  08/30/21 267 lb (121.1 kg)  04/06/21 267 lb (121.1 kg)  01/05/21 257 lb (116.6 kg)  08/02/20 263 lb 0.8 oz (119.3 kg)  02/11/20 259 lb (117.5 kg)     Physical Exam Vitals and nursing note reviewed.  Constitutional:  General: He is not in acute distress.    Appearance: He is well-developed.  Cardiovascular:     Rate and Rhythm: Normal rate and regular rhythm.  Pulmonary:     Effort: Pulmonary effort is normal.     Breath sounds: Normal breath sounds.  Skin:    General: Skin is warm and dry.  Neurological:     Mental Status: He is alert and oriented to person, place, and time.       Assessment & Plan:   Intermittent chest pain - Ambulatory referral to Cardiology - CBC - Comprehensive metabolic panel  2. Essential hypertension  Continue Norvasc  Follow up:  Follow up in 6 months or sooner if needed     Gene Andrew, NP 08/30/2021

## 2021-08-30 NOTE — Patient Instructions (Signed)
1. Intermittent chest pain  - Ambulatory referral to Cardiology - CBC - Comprehensive metabolic panel  2. Essential hypertension  Continue Norvasc  Follow up:  Follow up in 6 months or sooner if needed

## 2021-08-30 NOTE — Assessment & Plan Note (Signed)
-   Ambulatory referral to Cardiology - CBC - Comprehensive metabolic panel  2. Essential hypertension  Continue Norvasc  Follow up:  Follow up in 6 months or sooner if needed

## 2021-08-31 LAB — COMPREHENSIVE METABOLIC PANEL
ALT: 41 IU/L (ref 0–44)
AST: 66 IU/L — ABNORMAL HIGH (ref 0–40)
Albumin/Globulin Ratio: 1.4 (ref 1.2–2.2)
Albumin: 4.7 g/dL (ref 4.1–5.1)
Alkaline Phosphatase: 151 IU/L — ABNORMAL HIGH (ref 44–121)
BUN/Creatinine Ratio: 15 (ref 9–20)
BUN: 11 mg/dL (ref 6–24)
Bilirubin Total: 0.5 mg/dL (ref 0.0–1.2)
CO2: 18 mmol/L — ABNORMAL LOW (ref 20–29)
Calcium: 9.2 mg/dL (ref 8.7–10.2)
Chloride: 102 mmol/L (ref 96–106)
Creatinine, Ser: 0.72 mg/dL — ABNORMAL LOW (ref 0.76–1.27)
Globulin, Total: 3.3 g/dL (ref 1.5–4.5)
Glucose: 93 mg/dL (ref 70–99)
Potassium: 4.2 mmol/L (ref 3.5–5.2)
Sodium: 139 mmol/L (ref 134–144)
Total Protein: 8 g/dL (ref 6.0–8.5)
eGFR: 111 mL/min/{1.73_m2} (ref >59)

## 2021-08-31 LAB — CBC
Hematocrit: 37.6 % (ref 37.5–51.0)
Hemoglobin: 12.4 g/dL — ABNORMAL LOW (ref 13.0–17.7)
MCH: 28.3 pg (ref 26.6–33.0)
MCHC: 33 g/dL (ref 31.5–35.7)
MCV: 86 fL (ref 79–97)
Platelets: 300 10*3/uL (ref 150–450)
RBC: 4.38 x10E6/uL (ref 4.14–5.80)
RDW: 14.1 % (ref 11.6–15.4)
WBC: 8.7 10*3/uL (ref 3.4–10.8)

## 2021-09-05 ENCOUNTER — Other Ambulatory Visit: Payer: Self-pay

## 2021-09-06 ENCOUNTER — Ambulatory Visit (INDEPENDENT_AMBULATORY_CARE_PROVIDER_SITE_OTHER): Payer: Self-pay | Admitting: Cardiovascular Disease

## 2021-09-06 ENCOUNTER — Encounter: Payer: Self-pay | Admitting: Cardiovascular Disease

## 2021-09-06 VITALS — BP 120/71 | HR 86 | Ht 69.0 in | Wt 267.8 lb

## 2021-09-06 DIAGNOSIS — Z6839 Body mass index (BMI) 39.0-39.9, adult: Secondary | ICD-10-CM

## 2021-09-06 DIAGNOSIS — R079 Chest pain, unspecified: Secondary | ICD-10-CM

## 2021-09-06 DIAGNOSIS — R0683 Snoring: Secondary | ICD-10-CM

## 2021-09-06 DIAGNOSIS — K219 Gastro-esophageal reflux disease without esophagitis: Secondary | ICD-10-CM

## 2021-09-06 DIAGNOSIS — E782 Mixed hyperlipidemia: Secondary | ICD-10-CM

## 2021-09-06 DIAGNOSIS — I10 Essential (primary) hypertension: Secondary | ICD-10-CM

## 2021-09-06 DIAGNOSIS — E6609 Other obesity due to excess calories: Secondary | ICD-10-CM

## 2021-09-06 DIAGNOSIS — E785 Hyperlipidemia, unspecified: Secondary | ICD-10-CM

## 2021-09-06 DIAGNOSIS — R400 Somnolence: Secondary | ICD-10-CM

## 2021-09-06 NOTE — Progress Notes (Signed)
Cardiology Office Note    Date:  09/09/2021   ID:  Gene Reed, DOB 10-28-1970, MRN [id number]  PCP:  Fenton Foy, NP  Cardiologist:  Shelva Majestic, MD   New cardiology consultation referred through Lazaro Arms, NP   History of Present Illness:  Gene Reed is a 51 y.o. male who has a history of hypertension for at least 6 years, GERD, and recently has experienced some intermittent chest pain.  He was recently evaluated by Lazaro Arms, NP on August 30, 2021 and referred for cardiology consultation.  Gene Reed has a history of obesity, hypertension, and has experienced chest discomfort in the past.  In July 2021 he was evaluated at [facility] and had undergone a stress echo which was normal.  Recently, he has experienced some some musculoskeletal chest cramping discomfort.  Oftentimes he takes a deep breath when he gets this chest wall cramp and it gradually subsides.  He also has experienced some dyspeptic symptoms and at times feeling that his throat is closing.  He denies any exertional chest pain symptomatology or arm radiation.  He denies significant exertional dyspnea or palpitations.  He has been on omeprazole on a daily basis.  He admits to poor sleep with snoring, frequent awakenings, not sure he at least 2-3 times per night and often has to take daytime naps.  Sometimes he has to vomit in the morning to relieve some of his chest pressure.  He cannot sleep on his back.  He has been on amlodipine 10 mg for hypertension, rosuvastatin 10 mg for hyperlipidemia with LDL cholesterol on April 06, 2021 at 97.  Triglycerides were elevated at 245 consistent with a mixed hyperlipidemic pattern.  He is on allopurinol and colchicine with history of gout.  He is referred for cardiology evaluation.   Past Medical History:  Diagnosis Date   Ankle joint pain    Arthritis    Bilateral chronic knee pain    Bone spur    Gout    Hypertension    Knee pain    Urinary frequency 12/2018    Past  Surgical History:  Procedure Laterality Date   KNEE SURGERY      Current Medications: Outpatient Medications Prior to Visit  Medication Sig Dispense Refill   allopurinol (ZYLOPRIM) 100 MG tablet TAKE 1 TABLET (100 MG TOTAL) BY MOUTH DAILY. 90 tablet 3   amLODipine (NORVASC) 10 MG tablet TAKE 1 TABLET (10 MG TOTAL) BY MOUTH DAILY. 90 tablet 3   amoxicillin (AMOXIL) 875 MG tablet Take 1 tablet (875 mg total) by mouth 2 (two) times daily for 10 days. 20 tablet 0   colchicine 0.6 MG tablet Take 1 tablet (0.6 mg total) by mouth daily. 90 tablet 3   omeprazole (PRILOSEC) 20 MG capsule TAKE 1 CAPSULE (20 MG TOTAL) BY MOUTH DAILY. 90 capsule 3   rosuvastatin (CRESTOR) 10 MG tablet Take 1 tablet (10 mg total) by mouth daily. 90 tablet 3   No facility-administered medications prior to visit.     Allergies:   Patient has no known allergies.   Social History   Socioeconomic History   Marital status: Widowed    Spouse name: Not on file   Number of children: Not on file   Years of education: Not on file   Highest education level: Not on file  Occupational History   Not on file  Tobacco Use   Smoking status: Former   Smokeless tobacco: Never  Vaping Use  Vaping Use: Never used  Substance and Sexual Activity   Alcohol use: Yes    Comment:  6 beers per a day    Drug use: Yes    Types: Marijuana    Comment: 1 joint a day    Sexual activity: Not Currently  Other Topics Concern   Not on file  Social History Narrative   Not on file   Social Determinants of Health   Financial Resource Strain: Not on file  Food Insecurity: Not on file  Transportation Needs: Not on file  Physical Activity: Not on file  Stress: Not on file  Social Connections: Not on file    He was born in [place].  He is widowed for 3 years.  He has 1 child and 1 grandchild.  He works at [employer].  He uses cannabis on a daily basis and drinks approximately 8 beers per night.  Family History: He is adopted.   He does not know anything about his father.  He found out about his mother who died at age 80 with stomach cancer.  He does not keep in touch with siblings.  ROS General: Negative; No fevers, chills, or night sweats; obesity HEENT: Negative; No changes in vision or hearing, sinus congestion, difficulty swallowing Pulmonary: Negative; No cough, wheezing, shortness of breath, hemoptysis Cardiovascular: See HPI GI: Dyspeptic symptoms GU: Negative; No dysuria, hematuria, or difficulty voiding Musculoskeletal: Negative; no myalgias, joint pain, or weakness Hematologic/Oncology: Negative; no easy bruising, bleeding Endocrine: Negative; no heat/cold intolerance; no diabetes Neuro: Negative; no changes in balance, headaches Skin: Negative; No rashes or skin lesions Psychiatric: Negative; No behavioral problems, depression Sleep: Poor sleep, cannot sleep on his back, snoring, frequent awakenings, nocturia, daytime naps; no bruxism, restless legs, hypnogognic hallucinations, no cataplexy  An Epworth Sleepiness Scale score was calculated in the office today and this endorsed at 13 consistent with daytime sleepiness as shown below:   Epworth Sleepiness Scale: Situation   Chance of Dozing/Sleeping (0 = never , 1 = slight chance , 2 = moderate chance , 3 = high chance )   sitting and reading 3   watching TV 2   sitting inactive in a public place 2   being a passenger in a motor vehicle for an hour or more 3   lying down in the afternoon 3   sitting and talking to someone 0   sitting quietly after lunch (no alcohol) 0   while stopped for a few minutes in traffic as the driver 0   Total Score  13    Other comprehensive 14 point system review is negative.   PHYSICAL EXAM:   VS:  BP 120/71   Pulse 86   Ht 5' 9" (1.753 m)   Wt 267 lb 12.8 oz (121.5 kg)   SpO2 99%   BMI 39.55 kg/m     Repeat blood pressure by me was 132/78  Wt Readings from Last 3 Encounters:  09/06/21 267 lb 12.8 oz  (121.5 kg)  08/30/21 267 lb (121.1 kg)  04/06/21 267 lb (121.1 kg)    General: Alert, oriented, no distress.  Skin: normal turgor, no rashes, warm and dry HEENT: Normocephalic, atraumatic. Pupils equal round and reactive to light; sclera anicteric; extraocular muscles intact;  Nose without nasal septal hypertrophy Mouth/Parynx benign; Mallinpatti scale 4 Neck: No JVD, no carotid bruits; normal carotid upstroke Lungs: clear to ausculatation and percussion; no wheezing or rales Chest wall: without tenderness to palpitation Heart: PMI not displaced,  RRR, s1 s2 normal, 1/6 systolic murmur, no diastolic murmur, no rubs, gallops, thrills, or heaves Abdomen: Central adiposity; soft, nontender; no hepatosplenomehaly, BS+; abdominal aorta nontender and not dilated by palpation. Back: no CVA tenderness Pulses 2+ Musculoskeletal: full range of motion, normal strength, no joint deformities Extremities: no clubbing cyanosis or edema, Homan's sign negative  Neurologic: grossly nonfocal; Cranial nerves grossly wnl Psychologic: Normal mood and affect   Studies/Labs Reviewed:   September 06, 2021 ECG (independently read by me): NSR at 86, RBBB  Recent Labs:    Latest Ref Rng & Units 08/30/2021    1:54 PM 04/06/2021   10:58 AM 01/05/2021   12:20 PM  BMP  Glucose 70 - 99 mg/dL 93  84  83   BUN 6 - 24 mg/dL _0 Creatinine 0.76 - 1.27 mg/dL 0.72  0.69  0.64   BUN/Creat Ratio 9 - _1 Sodium 134 - 144 mmol/L 139  141  141   Potassium 3.5 - 5.2 mmol/L 4.2  4.2  4.1   Chloride 96 - 106 mmol/L 102  104  103   CO2 20 - 29 mmol/L 18     Calcium 8.7 - 10.2 mg/dL 9.2  9.2  9.1         Latest Ref Rng & Units 08/30/2021    1:54 PM 04/06/2021   10:58 AM 01/05/2021   12:20 PM  Hepatic Function  Total Protein 6.0 - 8.5 g/dL 8.0  8.0  8.1   Albumin 4.1 - 5.1 g/dL 4.7  4.6  4.7   AST 0 - 40 IU/L 66  56  65   ALT 0 - 44 IU/L 41     Alk Phosphatase 44 - 121 IU/L 151  156  164   Total  Bilirubin 0.0 - 1.2 mg/dL 0.5  0.4  0.4        Latest Ref Rng & Units 08/30/2021    1:54 PM 08/13/2019    9:36 AM 11/20/2016   10:51 AM  CBC  WBC 3.4 - 10.8 x10E3/uL 8.7  9.9  12.3   Hemoglobin 13.0 - 17.7 g/dL 12.4  12.9  13.3   Hematocrit 37.5 - 51.0 % 37.6  37.8  38.0   Platelets 150 - 450 x10E3/uL 300  263  267    Lab Results  Component Value Date   MCV 86 08/30/2021   MCV 93 08/13/2019   MCV 90.5 11/20/2016   Lab Results  Component Value Date   TSH 2.570 04/06/2021   Lab Results  Component Value Date   HGBA1C 4.0 12/16/2018   HGBA1C 4.9 12/16/2018     BNP No results found for: "BNP"  ProBNP No results found for: "PROBNP"   Lipid Panel     Component Value Date/Time   CHOL 181 04/06/2021 1058   TRIG 245 (H) 04/06/2021 1058   HDL 42 04/06/2021 1058   CHOLHDL 4.3 04/06/2021 1058   LDLCALC 97 04/06/2021 1058   LABVLDL 42 (H) 04/06/2021 1058     RADIOLOGY: No results found.   Additional studies/ records that were reviewed today include:  I reviewed the records of [facility] cardiology from August 26, 2019 and September 10, 2019.  Records of Lazaro Arms, NP were reviewed.   ASSESSMENT:    1. Chest pain, unspecified type   2. Essential hypertension   3. Snoring   4. Daytime sleepiness   5. Mixed hyperlipidemia  6. Gastroesophageal reflux disease, unspecified whether esophagitis present   7. Class 2 obesity due to excess calories with body mass index (BMI) of 39.0 to 39.9 in adult, unspecified whether serious comorbidity present     PLAN:  Gene Reed is a 51 year old gentleman who has a history of morbid obesity, hypertension for at least 6 years, GERD, and has experienced atypical chest pain.  His chest pain is nonexertional and is off and on all all and is often associated as a cramping sensation which gets improved with a deep breath.  At times he has to vomit in the morning which relieves some of his chest pressure.  He admits to dyspepsia and  at times difficulty with his throat.  He is on amlodipine 10 mg for hypertension.  I am recommending he undergo a 2D echo Doppler study to evaluate both systolic and diastolic function as well as valvular architecture.  He has a history of mixed hyperlipidemia and although does not smoke cigarettes he uses cannabis on a daily basis.  Most recent lipid panel in March 2023 shows total cholesterol 181 triglycerides 243 LDL cholesterol 97 and HDL 42.  He is on low-dose rosuvastatin at 10 mg and has been on this therapy since 2022.  I am scheduling him to undergo CT cardiac calcium score.  He had previously undergone a stress echo in [place] in 2021 and both the ECG and stress images were entirely normal.  With his symptoms highly suggestive of obstructive sleep apnea, I am recommending that he have an in lab split-night sleep study for initial evaluation.  With his body habitus and significant symptoms I suspect he may have severe sleep apnea and an in lab evaluation would be beneficial in the event BiPAP therapy is warranted.  I reviewed with him data regarding untreated sleep apnea and potential adverse cardiovascular consequences if left untreated.  Specifically I discussed its effects on hypertension, nocturnal arrhythmias, increased risk for atrial fibrillation, its effect on insulin resistance, increased inflammation, as well as GERD.  In addition I discussed the potential risk of nocturnal hypoxemia contributing to nocturnal ischemia particularly of atherosclerosis is present.  He uses cannabis on a daily basis and often has 8 beers per night.  I have recommended improved diet with reduction in alcohol and cannabis.  Although he is active at work he does not routinely exercise.  I will see him in follow-up of the above studies and further recommendations will be made at that time.   Medication Adjustments/Labs and Tests Ordered: Current medicines are reviewed at length with the patient today.  Concerns  regarding medicines are outlined above.  Medication changes, Labs and Tests ordered today are listed in the Patient Instructions below. Patient Instructions  Medication Instructions:  No changes     Lab Work: Not needed    Testing/Procedures: Will be schedule at [address] Your physician has requested that you have an echocardiogram. Echocardiography is a painless test that uses sound waves to create images of your heart. It provides your doctor with information about the size and shape of your heart and how well your heart's chambers and valves are working. This procedure takes approximately one hour. There are no restrictions for this procedure.  Will be schedule at [facility] long sleep center Your physician has recommended that you have a  split -night sleep study. This test records several body functions during sleep, including: brain activity, eye movement, oxygen and carbon dioxide blood levels, heart rate and rhythm,  breathing rate and rhythm, the flow of air through your mouth and nose, snoring, body muscle movements, and chest and belly movement.    CT coronary calcium score.   Test locations:   [facility] -[address]  This is $99 out of pocket.   Coronary CalciumScan A coronary calcium scan is an imaging test used to look for deposits of calcium and other fatty materials (plaques) in the inner lining of the blood vessels of the heart (coronary arteries). These deposits of calcium and plaques can partly clog and narrow the coronary arteries without producing any symptoms or warning signs. This puts a person at risk for a heart attack. This test can detect these deposits before symptoms develop. Tell a health care provider about: Any allergies you have. All medicines you are taking, including vitamins, herbs, eye drops, creams, and over-the-counter medicines. Any problems you or family members have had with anesthetic medicines. Any blood disorders  you have. Any surgeries you have had. Any medical conditions you have. Whether you are pregnant or may be pregnant. What are the risks? Generally, this is a safe procedure. However, problems may occur, including: Harm to a pregnant woman and her unborn baby. This test involves the use of radiation. Radiation exposure can be dangerous to a pregnant woman and her unborn baby. If you are pregnant, you generally should not have this procedure done. Slight increase in the risk of cancer. This is because of the radiation involved in the test. What happens before the procedure? No preparation is needed for this procedure. What happens during the procedure? You will undress and remove any jewelry around your neck or chest. You will put on a hospital gown. Sticky electrodes will be placed on your chest. The electrodes will be connected to an electrocardiogram (ECG) machine to record a tracing of the electrical activity of your heart. A CT scanner will take pictures of your heart. During this time, you will be asked to lie still and hold your breath for 2-3 seconds while a picture of your heart is being taken. The procedure may vary among health care providers and hospitals. What happens after the procedure? You can get dressed. You can return to your normal activities. It is up to you to get the results of your test. Ask your health care provider, or the department that is doing the test, when your results will be ready. Summary A coronary calcium scan is an imaging test used to look for deposits of calcium and other fatty materials (plaques) in the inner lining of the blood vessels of the heart (coronary arteries). Generally, this is a safe procedure. Tell your health care provider if you are pregnant or may be pregnant. No preparation is needed for this procedure. A CT scanner will take pictures of your heart. You can return to your normal activities after the scan is done. This information is not  intended to replace advice given to you by your health care provider. Make sure you discuss any questions you have with your health care provider. Document Released: 07/20/2007 Document Revised: 12/11/2015 Document Reviewed: 12/11/2015 Elsevier Interactive Patient Education  2017 [place]: At [facility], you and your health needs are our priority.  As part of our continuing mission to provide you with exceptional heart care, we have created designated Provider Care Teams.  These Care Teams include your primary Cardiologist (physician) and Advanced Practice Providers (APPs -  Physician Assistants and Nurse Practitioners) who all work together  to provide you with the care you need, when you need it.  We recommend signing up for the patient portal called "MyChart".  Sign up information is provided on this After Visit Summary.  MyChart is used to connect with patients for Virtual Visits (Telemedicine).  Patients are able to view lab/test results, encounter notes, upcoming appointments, etc.  Non-urgent messages can be sent to your provider as well.   To learn more about what you can do with MyChart, go to [email or web address].    Your next appointment:   4 month(s)  The format for your next appointment:   In Person  Provider:   Shelva Majestic, MD      Important Information About Sugar         Signed, Shelva Majestic, MD  09/09/2021 5:24 PM    [facility] [address], [place], [place], [place]  [address] Phone: [phone]

## 2021-09-06 NOTE — Patient Instructions (Signed)
Medication Instructions:  No changes     Lab Work: Not needed    Testing/Procedures: Will be schedule at [address] Your physician has requested that you have an echocardiogram. Echocardiography is a painless test that uses sound waves to create images of your heart. It provides your doctor with information about the size and shape of your heart and how well your heart's chambers and valves are working. This procedure takes approximately one hour. There are no restrictions for this procedure.  Will be schedule at [facility] long sleep center Your physician has recommended that you have a  split -night sleep study. This test records several body functions during sleep, including: brain activity, eye movement, oxygen and carbon dioxide blood levels, heart rate and rhythm, breathing rate and rhythm, the flow of air through your mouth and nose, snoring, body muscle movements, and chest and belly movement.    CT coronary calcium score.   Test locations:   [facility] -[address]  This is $99 out of pocket.   Coronary CalciumScan A coronary calcium scan is an imaging test used to look for deposits of calcium and other fatty materials (plaques) in the inner lining of the blood vessels of the heart (coronary arteries). These deposits of calcium and plaques can partly clog and narrow the coronary arteries without producing any symptoms or warning signs. This puts a person at risk for a heart attack. This test can detect these deposits before symptoms develop. Tell a health care provider about: Any allergies you have. All medicines you are taking, including vitamins, herbs, eye drops, creams, and over-the-counter medicines. Any problems you or family members have had with anesthetic medicines. Any blood disorders you have. Any surgeries you have had. Any medical conditions you have. Whether you are pregnant or may be pregnant. What are the risks? Generally, this is a  safe procedure. However, problems may occur, including: Harm to a pregnant woman and her unborn baby. This test involves the use of radiation. Radiation exposure can be dangerous to a pregnant woman and her unborn baby. If you are pregnant, you generally should not have this procedure done. Slight increase in the risk of cancer. This is because of the radiation involved in the test. What happens before the procedure? No preparation is needed for this procedure. What happens during the procedure? You will undress and remove any jewelry around your neck or chest. You will put on a hospital gown. Sticky electrodes will be placed on your chest. The electrodes will be connected to an electrocardiogram (ECG) machine to record a tracing of the electrical activity of your heart. A CT scanner will take pictures of your heart. During this time, you will be asked to lie still and hold your breath for 2-3 seconds while a picture of your heart is being taken. The procedure may vary among health care providers and hospitals. What happens after the procedure? You can get dressed. You can return to your normal activities. It is up to you to get the results of your test. Ask your health care provider, or the department that is doing the test, when your results will be ready. Summary A coronary calcium scan is an imaging test used to look for deposits of calcium and other fatty materials (plaques) in the inner lining of the blood vessels of the heart (coronary arteries). Generally, this is a safe procedure. Tell your health care provider if you are pregnant or may be pregnant. No preparation is needed for  this procedure. A CT scanner will take pictures of your heart. You can return to your normal activities after the scan is done. This information is not intended to replace advice given to you by your health care provider. Make sure you discuss any questions you have with your health care provider. Document  Released: 07/20/2007 Document Revised: 12/11/2015 Document Reviewed: 12/11/2015 Elsevier Interactive Patient Education  2017 [employer].   Follow-Up: At [facility], you and your health needs are our priority.  As part of our continuing mission to provide you with exceptional heart care, we have created designated Provider Care Teams.  These Care Teams include your primary Cardiologist (physician) and Advanced Practice Providers (APPs -  Physician Assistants and Nurse Practitioners) who all work together to provide you with the care you need, when you need it.  We recommend signing up for the patient portal called "MyChart".  Sign up information is provided on this After Visit Summary.  MyChart is used to connect with patients for Virtual Visits (Telemedicine).  Patients are able to view lab/test results, encounter notes, upcoming appointments, etc.  Non-urgent messages can be sent to your provider as well.   To learn more about what you can do with MyChart, go to [email or web address].    Your next appointment:   4 month(s)  The format for your next appointment:   In Person  Provider:   Nicki Guadalajara, MD      Important Information About Sugar

## 2021-09-09 ENCOUNTER — Encounter: Payer: Self-pay | Admitting: Cardiovascular Disease

## 2021-09-27 ENCOUNTER — Other Ambulatory Visit: Payer: Self-pay

## 2021-09-28 ENCOUNTER — Ambulatory Visit (INDEPENDENT_AMBULATORY_CARE_PROVIDER_SITE_OTHER): Payer: Self-pay

## 2021-09-28 ENCOUNTER — Ambulatory Visit (HOSPITAL_BASED_OUTPATIENT_CLINIC_OR_DEPARTMENT_OTHER)
Admission: RE | Admit: 2021-09-28 | Discharge: 2021-09-28 | Disposition: A | Discharge status: Home / Self Care | Payer: Self-pay | Source: Ambulatory Visit | Attending: Cardiovascular Disease | Admitting: Cardiovascular Disease

## 2021-09-28 DIAGNOSIS — Z6839 Body mass index (BMI) 39.0-39.9, adult: Secondary | ICD-10-CM

## 2021-09-28 DIAGNOSIS — E6609 Other obesity due to excess calories: Secondary | ICD-10-CM

## 2021-09-28 DIAGNOSIS — R0683 Snoring: Secondary | ICD-10-CM | POA: Insufficient documentation

## 2021-09-28 DIAGNOSIS — R400 Somnolence: Secondary | ICD-10-CM | POA: Insufficient documentation

## 2021-09-28 DIAGNOSIS — R079 Chest pain, unspecified: Secondary | ICD-10-CM | POA: Insufficient documentation

## 2021-09-28 LAB — ECHOCARDIOGRAM COMPLETE
AR max vel: 2.52 cm2
AV Area VTI: 2.38 cm2
AV Area mean vel: 2.29 cm2
AV Mean grad: 6 mmHg
AV Peak grad: 12.3 mmHg
Ao pk vel: 1.75 m/s
Area-P 1/2: 3.66 cm2
Calc EF: 69.6 %
S' Lateral: 3.21 cm
Single Plane A2C EF: 70.4 %
Single Plane A4C EF: 68.6 %

## 2021-09-30 ENCOUNTER — Ambulatory Visit (HOSPITAL_BASED_OUTPATIENT_CLINIC_OR_DEPARTMENT_OTHER): Payer: Self-pay | Attending: Cardiovascular Disease | Admitting: Cardiovascular Disease

## 2021-09-30 DIAGNOSIS — R400 Somnolence: Secondary | ICD-10-CM

## 2021-09-30 DIAGNOSIS — E6609 Other obesity due to excess calories: Secondary | ICD-10-CM | POA: Insufficient documentation

## 2021-09-30 DIAGNOSIS — G4733 Obstructive sleep apnea (adult) (pediatric): Secondary | ICD-10-CM

## 2021-09-30 DIAGNOSIS — Z6839 Body mass index (BMI) 39.0-39.9, adult: Secondary | ICD-10-CM | POA: Insufficient documentation

## 2021-09-30 DIAGNOSIS — E66812 Obesity, class 2: Secondary | ICD-10-CM

## 2021-09-30 DIAGNOSIS — R079 Chest pain, unspecified: Secondary | ICD-10-CM

## 2021-09-30 DIAGNOSIS — R0683 Snoring: Secondary | ICD-10-CM

## 2021-10-02 ENCOUNTER — Telehealth: Payer: Self-pay | Admitting: *Deleted

## 2021-10-02 ENCOUNTER — Encounter (HOSPITAL_BASED_OUTPATIENT_CLINIC_OR_DEPARTMENT_OTHER): Payer: Self-pay | Admitting: Cardiovascular Disease

## 2021-10-02 NOTE — Telephone Encounter (Signed)
Patient notified of sleep study results and recommendations. He agrees to start CPAP, however he has a machine that belonged to his wife that he says is only a year old. He will call me tomorrow with the information to see if it can be used.

## 2021-10-02 NOTE — Procedures (Signed)
Patient Name: Gene Reed, Gene Reed Date: 09/30/2021 Gender: Male D.O.B: 01/17/1971 Age (years): 74 Referring Provider: Shelva Majestic MD, ABSM Height (inches): 69 Interpreting Physician: Shelva Majestic MD, ABSM Weight (lbs): 265 RPSGT: Gwenyth Allegra BMI: 39 MRN: [id number] Neck Size: 18.00  CLINICAL INFORMATION Sleep Study Type: Split Night CPAP  Indication for sleep study: Hypertension  Epworth Sleepiness Score: 13  SLEEP STUDY TECHNIQUE As per the AASM Manual for the Scoring of Sleep and Associated Events v2.3 (April 2016) with a hypopnea requiring 4% desaturations.  The channels recorded and monitored were frontal, central and occipital EEG, electrooculogram (EOG), submentalis EMG (chin), nasal and oral airflow, thoracic and abdominal wall motion, anterior tibialis EMG, snore microphone, electrocardiogram, and pulse oximetry. Continuous positive airway pressure (CPAP) was initiated when the patient met split night criteria and was titrated according to treat sleep-disordered breathing.  MEDICATIONS allopurinol (ZYLOPRIM) 100 MG tablet amLODipine (NORVASC) 10 MG tablet colchicine 0.6 MG tablet omeprazole (PRILOSEC) 20 MG capsule rosuvastatin (CRESTOR) 10 MG tablet Medications self-administered by patient taken the night of the study : N/A  RESPIRATORY PARAMETERS Diagnostic Total AHI (/hr): 52.0 RDI (/hr): 63.6 OA Index (/hr): 7.2 CA Index (/hr): 0.0 REM AHI (/hr): 84.3 NREM AHI (/hr): 47.0 Supine AHI (/hr): N/A Non-supine AHI (/hr): 52.1 Min O2 Sat (%): 75.0 Mean O2 (%): 92.7 Time below 88% (min): 9.5   Titration Optimal Pressure (cm): 18 AHI at Optimal Pressure (/hr): 0.5 Min O2 at Optimal Pressure (%): 93.0 Supine % at Optimal (%): 0 Sleep % at Optimal (%): 92   SLEEP ARCHITECTURE The recording time for the entire night was 397 minutes.  During a baseline period of 151.1 minutes, the patient slept for 124.5 minutes in REM and nonREM, yielding a sleep  efficiency of 82.4%%. Sleep onset after lights out was 2.3 minutes with a REM latency of 108.0 minutes. The patient spent 36.5%% of the night in stage N1 sleep, 48.6%% in stage N2 sleep, 0.0%% in stage N3 and 14.9% in REM.  During the titration period of 244.9 minutes, the patient slept for 217.7 minutes in REM and nonREM, yielding a sleep efficiency of 88.9%%. Sleep onset after CPAP initiation was 7.3 minutes with a REM latency of 31.5 minutes. The patient spent 5.1%% of the night in stage N1 sleep, 53.1%% in stage N2 sleep, 0.0%% in stage N3 and 41.8% in REM.  CARDIAC DATA The 2 lead EKG demonstrated sinus rhythm. The mean heart rate was 100.0 beats per minute. Other EKG findings include: None.  LEG MOVEMENT DATA The total Periodic Limb Movements of Sleep (PLMS) were 0. The PLMS index was 0.0 .  IMPRESSIONS - Severe obstructive sleep apnea occurred during the diagnostic portion of the study (AHI 52.0/h; RDI 63.6/h); events were more severe during REM sleep (AHI 84.3/h).  CPAP was initiated at 8 cm and was titrated to optimal PAP pressure at 18 cm of water. REM rebound was demonstrated with CPAP therapy. - No significant central sleep apnea occurred during the diagnostic portion of the study (CAI = 0.0/hour). - Severe oxygen desaturation was noted during the diagnostic portion of the study to a nadir of 75%. - The patient snored with moderate snoring volume during the diagnostic portion of the study. - No cardiac abnormalities were noted during this study. - Clinically significant periodic limb movements did not occur during sleep.  DIAGNOSIS - Obstructive Sleep Apnea (G47.33)  RECOMMENDATIONS - Recommend an initial trial of CPAP Auto with EPR of 3 at 14 -  20 cm H2O with heated humidification. A Medium size Fisher&Paykel Full Face Simplus mask was used for the titration. - Effort should be made to optimize nasal and oropharyngeal patency. - Avoid alcohol, sedatives and other CNS depressants  that may worsen sleep apnea and disrupt normal sleep architecture. - Sleep hygiene should be reviewed to assess factors that may improve sleep quality. - Weight management (BMI 39) and regular exercise should be initiated or continued. - Recommend a download and sleep clinic evaluation after 4 weeks of therapy.  [Electronically signed] 10/02/2021 11:44 AM  Shelva Majestic MD, [facility] [place], [place], American Board of Sleep Medicine  NPI: [id number]  [place] PH: [phone]   FX: [phone] [place]

## 2021-10-02 NOTE — Telephone Encounter (Signed)
-----   Message from Lennette Bihari, MD sent at 10/02/2021 11:49 AM EDT ----- Gene Reed, please notify pt and set up CPAP initiation with DME

## 2021-10-09 ENCOUNTER — Other Ambulatory Visit: Payer: Self-pay

## 2021-10-09 DIAGNOSIS — R931 Abnormal findings on diagnostic imaging of heart and coronary circulation: Secondary | ICD-10-CM

## 2021-10-09 DIAGNOSIS — R079 Chest pain, unspecified: Secondary | ICD-10-CM

## 2021-10-09 MED ORDER — METOPROLOL TARTRATE 100 MG PO TABS
ORAL_TABLET | ORAL | 0 refills | Status: DC
  Filled 2021-10-09: qty 1, 1d supply, fill #0

## 2021-10-11 ENCOUNTER — Telehealth: Payer: Self-pay | Admitting: *Deleted

## 2021-10-11 NOTE — Telephone Encounter (Signed)
Patient called in with CPAP information he got from his wife's machine. She had a ResMed Airsense 10 that she got from [employer] He states that the machine is a little over a year old. Community Message sent to [employer] and [employer] Newcomer with [employer] asking them to get the name changed out from his deceased wife to him. Once this has been done link him to Dr Tresa Endo in [place] and let me know, so machine can be set to pressures ordered by Dr Tresa Endo for this patient.

## 2021-10-16 ENCOUNTER — Other Ambulatory Visit: Payer: Self-pay

## 2021-10-16 ENCOUNTER — Telehealth: Payer: Self-pay | Admitting: *Deleted

## 2021-10-16 NOTE — Telephone Encounter (Signed)
Staff message sent to [facility] to schedule patient for 90 compliance visit.

## 2021-10-16 NOTE — Telephone Encounter (Signed)
Patient has been notified the personal information has been changed in his wife's CPAP machine to his. Pressures has been set per Dr Landry Dyke orders. Ok to start using the machine tonight. He will need a compliance visit within 90 days to be seen by Dr Tresa Endo. Message will be sent to [facility] to schedule.

## 2021-10-25 ENCOUNTER — Other Ambulatory Visit (HOSPITAL_COMMUNITY): Payer: Self-pay | Admitting: *Deleted

## 2021-10-25 ENCOUNTER — Other Ambulatory Visit: Payer: Self-pay

## 2021-10-25 ENCOUNTER — Telehealth (HOSPITAL_COMMUNITY): Payer: Self-pay | Admitting: *Deleted

## 2021-10-25 MED ORDER — METOPROLOL TARTRATE 100 MG PO TABS
ORAL_TABLET | ORAL | 0 refills | Status: DC
  Filled 2021-10-25: qty 1, 1d supply, fill #0

## 2021-10-25 NOTE — Telephone Encounter (Signed)
Patient returning call regarding upcoming cardiac imaging study; pt verbalizes understanding of appt date/time, parking situation and where to check in, medications ordered, and verified current allergies; name and call back number provided for further questions should they arise  Gene Clement RN Navigator Cardiac Imaging [facility] [phone] office [phone] cell  Patient to take 100mg  metoprolol tartrate two hours prior to his cardiac CT scan. He is aware to arrive at 3:30pm.

## 2021-10-25 NOTE — Telephone Encounter (Signed)
Attempted to call patient regarding upcoming cardiac CT appointment. Patient did not pick up and unable to leave a message.  Simona Rocque RN Navigator Cardiac Imaging [facility] [phone] Office [phone] Cell. 

## 2021-10-26 ENCOUNTER — Other Ambulatory Visit: Payer: Self-pay

## 2021-10-26 ENCOUNTER — Ambulatory Visit (HOSPITAL_COMMUNITY)
Admission: RE | Admit: 2021-10-26 | Discharge: 2021-10-26 | Disposition: A | Discharge status: Home / Self Care | Payer: Self-pay | Source: Ambulatory Visit | Attending: Cardiovascular Disease | Admitting: Cardiovascular Disease

## 2021-10-26 ENCOUNTER — Encounter (HOSPITAL_COMMUNITY): Payer: Self-pay

## 2021-10-26 DIAGNOSIS — R079 Chest pain, unspecified: Secondary | ICD-10-CM | POA: Insufficient documentation

## 2021-10-26 DIAGNOSIS — R931 Abnormal findings on diagnostic imaging of heart and coronary circulation: Secondary | ICD-10-CM | POA: Insufficient documentation

## 2021-10-26 MED ORDER — DILTIAZEM HCL 25 MG/5ML IV SOLN: 5.0000 mg | Freq: Once | INTRAVENOUS | Status: AC

## 2021-10-26 MED ORDER — METOPROLOL TARTRATE 5 MG/5ML IV SOLN
5.0000 mg | INTRAVENOUS | Status: DC | PRN
  Administered 2021-10-26: 5 mg via INTRAVENOUS

## 2021-10-26 MED ORDER — METOPROLOL TARTRATE 5 MG/5ML IV SOLN
INTRAVENOUS | Status: AC
  Filled 2021-10-26: qty 10

## 2021-10-26 MED ORDER — METOPROLOL TARTRATE 5 MG/5ML IV SOLN
INTRAVENOUS | Status: AC
  Administered 2021-10-26: 10 mg via INTRAVENOUS
  Filled 2021-10-26: qty 5

## 2021-10-26 MED ORDER — NITROGLYCERIN 0.4 MG SL SUBL: 0.8000 mg | SUBLINGUAL_TABLET | Freq: Once | SUBLINGUAL | Status: DC

## 2021-10-26 MED ORDER — DILTIAZEM HCL 25 MG/5ML IV SOLN
INTRAVENOUS | Status: AC
  Administered 2021-10-26: 5 mg via INTRAVENOUS
  Filled 2021-10-26: qty 5

## 2021-10-26 NOTE — Progress Notes (Signed)
Patient to be rescheduled due to high heart rate.  Patient given medications (see MAR) and unable to lower heart rate.  CT Navigator made aware as well as MD.

## 2021-10-30 ENCOUNTER — Other Ambulatory Visit: Payer: Self-pay

## 2021-11-16 ENCOUNTER — Other Ambulatory Visit: Payer: Self-pay

## 2021-11-16 ENCOUNTER — Other Ambulatory Visit (HOSPITAL_COMMUNITY): Payer: Self-pay | Admitting: Cardiovascular Disease

## 2021-11-19 ENCOUNTER — Other Ambulatory Visit: Payer: Self-pay

## 2021-11-19 ENCOUNTER — Telehealth (HOSPITAL_COMMUNITY): Payer: Self-pay | Admitting: Emergency Medicine

## 2021-11-19 DIAGNOSIS — R079 Chest pain, unspecified: Secondary | ICD-10-CM

## 2021-11-19 MED ORDER — METOPROLOL TARTRATE 100 MG PO TABS
ORAL_TABLET | ORAL | 0 refills | Status: DC
  Filled 2021-11-19: qty 1, 1d supply, fill #0

## 2021-11-19 NOTE — Telephone Encounter (Signed)
Reaching out to patient to offer assistance regarding upcoming cardiac imaging study; pt verbalizes understanding of appt date/time, parking situation and where to check in, pre-test NPO status and medications ordered, and verified current allergies; name and call back number provided for further questions should they arise Marchia Bond RN Navigator Cardiac Imaging [place] [facility] [phone] office [phone] cell  Arrival 1200 w/c entrance Denies iv issues Attempted last time after work- unable to lower HR with meds, has taken off work this time 100mg  metoprolol 2 h prior to scan

## 2021-11-20 ENCOUNTER — Ambulatory Visit (HOSPITAL_COMMUNITY)
Admission: RE | Admit: 2021-11-20 | Discharge: 2021-11-20 | Disposition: A | Discharge status: Home / Self Care | Payer: Self-pay | Source: Ambulatory Visit | Attending: Cardiovascular Disease | Admitting: Cardiovascular Disease

## 2021-11-20 DIAGNOSIS — R931 Abnormal findings on diagnostic imaging of heart and coronary circulation: Secondary | ICD-10-CM | POA: Insufficient documentation

## 2021-11-20 DIAGNOSIS — I2510 Atherosclerotic heart disease of native coronary artery without angina pectoris: Secondary | ICD-10-CM

## 2021-11-20 DIAGNOSIS — R079 Chest pain, unspecified: Secondary | ICD-10-CM | POA: Insufficient documentation

## 2021-11-20 MED ORDER — NITROGLYCERIN 0.4 MG SL SUBL
SUBLINGUAL_TABLET | SUBLINGUAL | Status: AC
  Administered 2021-11-20: 0.8 mg via SUBLINGUAL
  Filled 2021-11-20: qty 2

## 2021-11-20 MED ORDER — NITROGLYCERIN 0.4 MG SL SUBL: 0.8000 mg | SUBLINGUAL_TABLET | Freq: Once | SUBLINGUAL | Status: AC

## 2021-11-20 MED ORDER — DILTIAZEM HCL 25 MG/5ML IV SOLN: 5.0000 mg | INTRAVENOUS | Status: DC | PRN

## 2021-11-20 MED ORDER — IOHEXOL 350 MG/ML SOLN
100.0000 mL | Freq: Once | INTRAVENOUS | Status: AC | PRN
  Administered 2021-11-20: 100 mL via INTRAVENOUS

## 2021-11-20 MED ORDER — METOPROLOL TARTRATE 5 MG/5ML IV SOLN
INTRAVENOUS | Status: AC
  Administered 2021-11-20: 10 mg via INTRAVENOUS
  Filled 2021-11-20: qty 10

## 2021-11-20 MED ORDER — METOPROLOL TARTRATE 5 MG/5ML IV SOLN: 5.0000 mg | INTRAVENOUS | Status: DC | PRN

## 2021-11-20 MED ORDER — DILTIAZEM HCL 25 MG/5ML IV SOLN
INTRAVENOUS | Status: AC
  Filled 2021-11-20: qty 5

## 2021-11-28 ENCOUNTER — Encounter: Payer: Self-pay | Admitting: Student
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
## 2021-11-29 ENCOUNTER — Encounter: Payer: Self-pay | Admitting: Student

## 2021-11-29 ENCOUNTER — Ambulatory Visit: Payer: Self-pay | Attending: Student | Admitting: Student

## 2021-11-29 ENCOUNTER — Other Ambulatory Visit: Payer: Self-pay

## 2021-11-29 VITALS — BP 142/84 | HR 100 | Ht 69.0 in | Wt 262.2 lb

## 2021-11-29 DIAGNOSIS — I25119 Atherosclerotic heart disease of native coronary artery with unspecified angina pectoris: Secondary | ICD-10-CM

## 2021-11-29 DIAGNOSIS — I10 Essential (primary) hypertension: Secondary | ICD-10-CM

## 2021-11-29 DIAGNOSIS — R079 Chest pain, unspecified: Secondary | ICD-10-CM

## 2021-11-29 DIAGNOSIS — E278 Other specified disorders of adrenal gland: Secondary | ICD-10-CM

## 2021-11-29 DIAGNOSIS — E785 Hyperlipidemia, unspecified: Secondary | ICD-10-CM

## 2021-11-29 DIAGNOSIS — R0600 Dyspnea, unspecified: Secondary | ICD-10-CM

## 2021-11-29 DIAGNOSIS — I2510 Atherosclerotic heart disease of native coronary artery without angina pectoris: Secondary | ICD-10-CM | POA: Insufficient documentation

## 2021-11-29 MED ORDER — NITROGLYCERIN 0.4 MG SL SUBL
0.4000 mg | SUBLINGUAL_TABLET | SUBLINGUAL | 3 refills | Status: DC | PRN
  Filled 2021-11-29: qty 25, 25d supply, fill #0
  Filled 2022-06-28: qty 25, 9d supply, fill #1
  Filled 2022-08-13: qty 25, 9d supply, fill #2

## 2021-11-29 MED ORDER — CARVEDILOL 6.25 MG PO TABS
6.2500 mg | ORAL_TABLET | Freq: Two times a day (BID) | ORAL | 3 refills | Status: DC
  Filled 2021-11-29: qty 180, 90d supply, fill #0

## 2021-11-29 MED ORDER — ROSUVASTATIN CALCIUM 20 MG PO TABS
20.0000 mg | ORAL_TABLET | Freq: Every day | ORAL | 3 refills | Status: DC
  Filled 2021-11-29: qty 90, 90d supply, fill #0

## 2021-11-29 MED ORDER — ASPIRIN 81 MG PO TBEC: 81.0000 mg | DELAYED_RELEASE_TABLET | Freq: Every day | ORAL | 3 refills | Status: DC

## 2021-11-29 NOTE — Patient Instructions (Addendum)
Medication Instructions:   START: ASPIRIN 81mg  ONCE DAILY   START: CRESTOR 20mg  ONCE DAILY   START:: CARVEDILOL 6.25mg  TWICE DAILY   START: NITROGLYCERIN UNDER THE TONGUE EVERY 5 MINUTES AS NEEDED FOR CHEST PAIN. DO NOT TAKE MORE THAN 3 DOSES WITHIN 15 MINUTES   NITROGLYCERIN  is a type of vasodilator. It relaxes blood vessels, increasing the blood and oxygen supply to your heart. This medicine is used to relieve chest pain caused by angina.  In an angina attack (CHEST PAIN), you should feel better within 5 minutes after your first dose. Do not swallow whole. Place tablet under your tongue. Sit down when taking this medicine. You can take a dose every 5 minutes up to a total of 3 doses. If you do not feel better or feel worse after 1 dose, call 9-1-1 at once. Do not take more than 3 doses in 15 minutes. Do not take your medicine more often than directed.  *If you need a refill on your cardiac medications before your next appointment, please call your pharmacy*  Lab Work: BLOOD WORK TODAY  If you have labs (blood work) drawn today and your tests are completely normal, you will receive your results only by: [place] (if you have MyChart) OR A paper copy in the mail If you have any lab test that is abnormal or we need to change your treatment, we will call you to review the results.  Testing/Procedures: Your physician has requested that you have a cardiac catheterization. Cardiac catheterization is used to diagnose and/or treat various heart conditions. Doctors may recommend this procedure for a number of different reasons. The most common reason is to evaluate chest pain. Chest pain can be a symptom of coronary artery disease (CAD), and cardiac catheterization can show whether plaque is narrowing or blocking your heart's arteries. This procedure is also used to evaluate the valves, as well as measure the blood flow and oxygen levels in different parts of your heart. For further  information please visit HugeFiesta.tn. Please follow instruction sheet, as given.   Follow-Up: At [facility], you and your health needs are our priority.  As part of our continuing mission to provide you with exceptional heart care, we have created designated Provider Care Teams.  These Care Teams include your primary Cardiologist (physician) and Advanced Practice Providers (APPs -  Physician Assistants and Nurse Practitioners) who all work together to provide you with the care you need, when you need it.  Your next appointment:   NOVEMBER 8th at 1:55PM  The format for your next appointment:   In Person  Provider:   Sande Rives PA-C  Other Instructions  [place] A DEPT OF [place] A DEPT OF [place]. [facility] [place] [id number] [place] [address] Dept: [phone] Loc: [phone]  AUGUSTE TEBBETTS  11/29/2021  You are scheduled for a Cardiac Catheterization on Thursday, NOVEMBER 2nd with Dr. Shelva Majestic.  1. Please arrive at the [facility] (Main Entrance A) at [facility]: [address] [place], [place] [address] at 5:30 AM (This time is two hours before your procedure to ensure your preparation). Free valet parking service is available.   Special note: Every effort is made to have your procedure done on time. Please understand that emergencies sometimes delay scheduled procedures.  2. Diet: Do not eat solid foods after midnight.  The patient may have clear liquids  until 5am upon the day of the procedure.  3. Labs: You will need to have blood drawn on TODAY  4. Medication instructions in preparation for your procedure:   Contrast Allergy: No  On the morning of your procedure, take your Aspirin 81 mg and any morning medicines NOT listed above.  You may use sips of water.  5. Plan for one night stay--bring personal belongings. 6. Bring a  current list of your medications and current insurance cards. 7. You MUST have a responsible person to drive you home. 8. Someone MUST be with you the first 24 hours after you arrive home or your discharge will be delayed. 9. Please wear clothes that are easy to get on and off and wear slip-on shoes.  Thank you for allowing [place] to care for you!   -- [place] Invasive Cardiovascular services

## 2021-11-30 ENCOUNTER — Other Ambulatory Visit: Payer: Self-pay

## 2021-11-30 LAB — BASIC METABOLIC PANEL
BUN/Creatinine Ratio: 13 (ref 9–20)
BUN: 10 mg/dL (ref 6–24)
CO2: 22 mmol/L (ref 20–29)
Calcium: 8.9 mg/dL (ref 8.7–10.2)
Chloride: 102 mmol/L (ref 96–106)
Creatinine, Ser: 0.78 mg/dL (ref 0.76–1.27)
Glucose: 82 mg/dL (ref 70–99)
Potassium: 4.2 mmol/L (ref 3.5–5.2)
Sodium: 140 mmol/L (ref 134–144)
eGFR: 109 mL/min/{1.73_m2} (ref >59)

## 2021-11-30 LAB — CBC
Hematocrit: 36.4 % — ABNORMAL LOW (ref 37.5–51.0)
Hemoglobin: 12.2 g/dL — ABNORMAL LOW (ref 13.0–17.7)
MCH: 28.4 pg (ref 26.6–33.0)
MCHC: 33.5 g/dL (ref 31.5–35.7)
MCV: 85 fL (ref 79–97)
Platelets: 297 10*3/uL (ref 150–450)
RBC: 4.29 x10E6/uL (ref 4.14–5.80)
RDW: 14.5 % (ref 11.6–15.4)
WBC: 9.8 10*3/uL (ref 3.4–10.8)

## 2021-12-04 ENCOUNTER — Telehealth: Payer: Self-pay | Admitting: *Deleted

## 2021-12-04 NOTE — Telephone Encounter (Signed)
Cardiac Catheterization scheduled at [facility] for: Thursday December 06, 2021 7:30 AM Arrival time and place: [place] Entrance A at: 5:30 AM  Nothing to eat after midnight prior to procedure, clear liquids until 5 AM day of procedure.  Medication instructions: -Usual morning medications can be taken with sips of water including aspirin 81 mg.  Confirmed patient has responsible adult to drive home post procedure and be with patient first 24 hours after arriving home.  Patient reports no new symptoms concerning for COVID-19 in the past 10 days.  Reviewed procedure instructions with patient.

## 2021-12-06 ENCOUNTER — Ambulatory Visit (HOSPITAL_COMMUNITY)
Admission: RE | Admit: 2021-12-06 | Discharge: 2021-12-07 | Disposition: A | Discharge status: Home / Self Care | Payer: Self-pay | Source: Ambulatory Visit | Attending: Cardiovascular Disease | Admitting: Cardiovascular Disease

## 2021-12-06 ENCOUNTER — Other Ambulatory Visit: Payer: Self-pay

## 2021-12-06 ENCOUNTER — Other Ambulatory Visit (HOSPITAL_COMMUNITY): Payer: Self-pay

## 2021-12-06 ENCOUNTER — Ambulatory Visit (HOSPITAL_COMMUNITY)
Admission: RE | Disposition: A | Discharge status: Home / Self Care | Payer: Self-pay | Source: Ambulatory Visit | Attending: Cardiovascular Disease

## 2021-12-06 DIAGNOSIS — G8929 Other chronic pain: Secondary | ICD-10-CM | POA: Insufficient documentation

## 2021-12-06 DIAGNOSIS — R0600 Dyspnea, unspecified: Secondary | ICD-10-CM | POA: Insufficient documentation

## 2021-12-06 DIAGNOSIS — R931 Abnormal findings on diagnostic imaging of heart and coronary circulation: Secondary | ICD-10-CM

## 2021-12-06 DIAGNOSIS — E785 Hyperlipidemia, unspecified: Secondary | ICD-10-CM | POA: Diagnosis present

## 2021-12-06 DIAGNOSIS — Z955 Presence of coronary angioplasty implant and graft: Secondary | ICD-10-CM

## 2021-12-06 DIAGNOSIS — E278 Other specified disorders of adrenal gland: Secondary | ICD-10-CM | POA: Insufficient documentation

## 2021-12-06 DIAGNOSIS — K219 Gastro-esophageal reflux disease without esophagitis: Secondary | ICD-10-CM | POA: Insufficient documentation

## 2021-12-06 DIAGNOSIS — Z79899 Other long term (current) drug therapy: Secondary | ICD-10-CM | POA: Insufficient documentation

## 2021-12-06 DIAGNOSIS — I2510 Atherosclerotic heart disease of native coronary artery without angina pectoris: Secondary | ICD-10-CM

## 2021-12-06 DIAGNOSIS — I10 Essential (primary) hypertension: Secondary | ICD-10-CM | POA: Diagnosis present

## 2021-12-06 DIAGNOSIS — M109 Gout, unspecified: Secondary | ICD-10-CM | POA: Insufficient documentation

## 2021-12-06 DIAGNOSIS — I2584 Coronary atherosclerosis due to calcified coronary lesion: Secondary | ICD-10-CM | POA: Insufficient documentation

## 2021-12-06 DIAGNOSIS — I25118 Atherosclerotic heart disease of native coronary artery with other forms of angina pectoris: Secondary | ICD-10-CM | POA: Insufficient documentation

## 2021-12-06 HISTORY — PX: LEFT HEART CATH AND CORONARY ANGIOGRAPHY: CATH118249

## 2021-12-06 HISTORY — PX: CORONARY STENT INTERVENTION: CATH118234

## 2021-12-06 HISTORY — PX: CORONARY LITHOTRIPSY: CATH118330

## 2021-12-06 LAB — GLUCOSE, CAPILLARY: Glucose-Capillary: 107 mg/dL — ABNORMAL HIGH (ref 70–99)

## 2021-12-06 LAB — POCT ACTIVATED CLOTTING TIME
Activated Clotting Time: 269 seconds
Activated Clotting Time: 510 seconds
Activated Clotting Time: 438 seconds

## 2021-12-06 SURGERY — LEFT HEART CATH AND CORONARY ANGIOGRAPHY
Anesthesia: LOCAL | Laterality: Right

## 2021-12-06 MED ORDER — ONDANSETRON HCL 4 MG/2ML IJ SOLN: 4.0000 mg | Freq: Four times a day (QID) | INTRAMUSCULAR | Status: DC | PRN

## 2021-12-06 MED ORDER — ASPIRIN 81 MG PO CHEW: 81.0000 mg | CHEWABLE_TABLET | ORAL | Status: DC

## 2021-12-06 MED ORDER — SODIUM CHLORIDE 0.9% FLUSH: 3.0000 mL | Freq: Two times a day (BID) | INTRAVENOUS | Status: DC

## 2021-12-06 MED ORDER — LIDOCAINE HCL (PF) 1 % IJ SOLN
INTRAMUSCULAR | Status: DC | PRN
  Administered 2021-12-06: 2 mL via INTRADERMAL

## 2021-12-06 MED ORDER — MIDAZOLAM HCL 2 MG/2ML IJ SOLN
INTRAMUSCULAR | Status: AC
  Filled 2021-12-06: qty 2

## 2021-12-06 MED ORDER — SODIUM CHLORIDE 0.9 % IV SOLN: 250.0000 mL | INTRAVENOUS | Status: DC | PRN

## 2021-12-06 MED ORDER — VERAPAMIL HCL 2.5 MG/ML IV SOLN
INTRAVENOUS | Status: DC | PRN
  Administered 2021-12-06: 10 mL via INTRA_ARTERIAL

## 2021-12-06 MED ORDER — NITROGLYCERIN 0.4 MG SL SUBL: 0.4000 mg | SUBLINGUAL_TABLET | SUBLINGUAL | Status: DC | PRN

## 2021-12-06 MED ORDER — SODIUM CHLORIDE 0.9% FLUSH
3.0000 mL | Freq: Two times a day (BID) | INTRAVENOUS | Status: DC
  Administered 2021-12-06 – 2021-12-07 (×2): 3 mL via INTRAVENOUS

## 2021-12-06 MED ORDER — FENTANYL CITRATE (PF) 100 MCG/2ML IJ SOLN
INTRAMUSCULAR | Status: DC | PRN
  Administered 2021-12-06 (×2): 25 ug via INTRAVENOUS
  Administered 2021-12-06: 50 ug via INTRAVENOUS

## 2021-12-06 MED ORDER — FENTANYL CITRATE (PF) 100 MCG/2ML IJ SOLN
INTRAMUSCULAR | Status: AC
  Filled 2021-12-06: qty 2

## 2021-12-06 MED ORDER — NITROGLYCERIN 1 MG/10 ML FOR IR/CATH LAB
INTRA_ARTERIAL | Status: AC
  Filled 2021-12-06: qty 10

## 2021-12-06 MED ORDER — TICAGRELOR 90 MG PO TABS
ORAL_TABLET | ORAL | Status: AC
  Filled 2021-12-06: qty 2

## 2021-12-06 MED ORDER — DIAZEPAM 5 MG PO TABS: 5.0000 mg | ORAL_TABLET | Freq: Four times a day (QID) | ORAL | Status: DC | PRN

## 2021-12-06 MED ORDER — LABETALOL HCL 5 MG/ML IV SOLN: 10.0000 mg | INTRAVENOUS | Status: AC | PRN

## 2021-12-06 MED ORDER — ASPIRIN 81 MG PO CHEW: 81.0000 mg | CHEWABLE_TABLET | Freq: Every day | ORAL | Status: DC

## 2021-12-06 MED ORDER — FENTANYL CITRATE (PF) 100 MCG/2ML IJ SOLN
25.0000 ug | Freq: Once | INTRAMUSCULAR | Status: AC
  Administered 2021-12-06: 25 ug via INTRAVENOUS

## 2021-12-06 MED ORDER — ROSUVASTATIN CALCIUM 20 MG PO TABS
40.0000 mg | ORAL_TABLET | Freq: Every day | ORAL | Status: DC
  Administered 2021-12-06: 40 mg via ORAL
  Filled 2021-12-06 (×2): qty 1
  Filled 2021-12-06: qty 2

## 2021-12-06 MED ORDER — HEPARIN SODIUM (PORCINE) 1000 UNIT/ML IJ SOLN
INTRAMUSCULAR | Status: DC | PRN
  Administered 2021-12-06: 6000 [IU] via INTRAVENOUS
  Administered 2021-12-06: 7000 [IU] via INTRAVENOUS
  Administered 2021-12-06: 2000 [IU] via INTRAVENOUS

## 2021-12-06 MED ORDER — HEPARIN (PORCINE) IN NACL 1000-0.9 UT/500ML-% IV SOLN
INTRAVENOUS | Status: AC
  Filled 2021-12-06: qty 1000

## 2021-12-06 MED ORDER — SODIUM CHLORIDE 0.9 % WEIGHT BASED INFUSION: 3.0000 mL/kg/h | INTRAVENOUS | Status: DC

## 2021-12-06 MED ORDER — AMLODIPINE BESYLATE 10 MG PO TABS
10.0000 mg | ORAL_TABLET | Freq: Every day | ORAL | Status: DC
  Administered 2021-12-06: 10 mg via ORAL
  Filled 2021-12-06: qty 2
  Filled 2021-12-06: qty 1

## 2021-12-06 MED ORDER — NITROGLYCERIN 1 MG/10 ML FOR IR/CATH LAB
INTRA_ARTERIAL | Status: DC | PRN
  Administered 2021-12-06: 200 ug via INTRACORONARY
  Administered 2021-12-06 (×2): 100 ug via INTRACORONARY
  Administered 2021-12-06: 200 ug via INTRACORONARY

## 2021-12-06 MED ORDER — ACETAMINOPHEN 325 MG PO TABS: 650.0000 mg | ORAL_TABLET | ORAL | Status: DC | PRN

## 2021-12-06 MED ORDER — LIDOCAINE HCL (PF) 1 % IJ SOLN
INTRAMUSCULAR | Status: AC
  Filled 2021-12-06: qty 30

## 2021-12-06 MED ORDER — TICAGRELOR 90 MG PO TABS
90.0000 mg | ORAL_TABLET | Freq: Two times a day (BID) | ORAL | Status: DC
  Administered 2021-12-06 – 2021-12-07 (×2): 90 mg via ORAL
  Filled 2021-12-06 (×2): qty 1

## 2021-12-06 MED ORDER — ISOSORBIDE MONONITRATE ER 30 MG PO TB24
30.0000 mg | ORAL_TABLET | Freq: Every day | ORAL | Status: DC
  Administered 2021-12-06 – 2021-12-07 (×2): 30 mg via ORAL
  Filled 2021-12-06 (×2): qty 1

## 2021-12-06 MED ORDER — SODIUM CHLORIDE 0.9 % WEIGHT BASED INFUSION
3.0000 mL/kg/h | INTRAVENOUS | Status: DC
  Administered 2021-12-06: 3 mL/kg/h via INTRAVENOUS

## 2021-12-06 MED ORDER — HEPARIN SODIUM (PORCINE) 1000 UNIT/ML IJ SOLN
INTRAMUSCULAR | Status: AC
  Filled 2021-12-06: qty 10

## 2021-12-06 MED ORDER — SODIUM CHLORIDE 0.9% FLUSH: 3.0000 mL | INTRAVENOUS | Status: DC | PRN

## 2021-12-06 MED ORDER — ASPIRIN 81 MG PO TBEC
81.0000 mg | DELAYED_RELEASE_TABLET | Freq: Every day | ORAL | Status: DC
  Filled 2021-12-06: qty 1

## 2021-12-06 MED ORDER — VERAPAMIL HCL 2.5 MG/ML IV SOLN
INTRAVENOUS | Status: AC
  Filled 2021-12-06: qty 2

## 2021-12-06 MED ORDER — MIDAZOLAM HCL 2 MG/2ML IJ SOLN
INTRAMUSCULAR | Status: DC | PRN
  Administered 2021-12-06: 1 mg via INTRAVENOUS
  Administered 2021-12-06: 2 mg via INTRAVENOUS
  Administered 2021-12-06 (×2): 1 mg via INTRAVENOUS

## 2021-12-06 MED ORDER — CARVEDILOL 6.25 MG PO TABS
6.2500 mg | ORAL_TABLET | Freq: Two times a day (BID) | ORAL | Status: DC
  Administered 2021-12-06 – 2021-12-07 (×3): 6.25 mg via ORAL
  Filled 2021-12-06 (×2): qty 1
  Filled 2021-12-06: qty 2

## 2021-12-06 MED ORDER — SODIUM CHLORIDE 0.9 % IV SOLN: INTRAVENOUS | Status: AC

## 2021-12-06 MED ORDER — TICAGRELOR 90 MG PO TABS
ORAL_TABLET | ORAL | Status: DC | PRN
  Administered 2021-12-06: 180 mg via ORAL

## 2021-12-06 MED ORDER — HYDRALAZINE HCL 20 MG/ML IJ SOLN: 10.0000 mg | INTRAMUSCULAR | Status: AC | PRN

## 2021-12-06 MED ORDER — SODIUM CHLORIDE 0.9 % WEIGHT BASED INFUSION: 1.0000 mL/kg/h | INTRAVENOUS | Status: DC

## 2021-12-06 MED ORDER — SODIUM CHLORIDE 0.9 % IV BOLUS
INTRAVENOUS | Status: AC | PRN
  Administered 2021-12-06: 250 mL via INTRAVENOUS

## 2021-12-06 SURGICAL SUPPLY — 25 items
BALL SAPPHIRE NC24 3.75X15 (BALLOONS) ×2
BALLN SAPPHIRE 2.0X15 (BALLOONS) ×2
BALLN SCOREFLEX 2.50X10 (BALLOONS) ×2
BALLOON SAPPHIRE 2.0X15 (BALLOONS) IMPLANT
BALLOON SAPPHIRE NC24 3.75X15 (BALLOONS) IMPLANT
BALLOON SCOREFLEX 2.50X10 (BALLOONS) IMPLANT
BAND ZEPHYR COMPRESS 30 LONG (HEMOSTASIS) IMPLANT
CATH INFINITI 5FR ANG PIGTAIL (CATHETERS) IMPLANT
CATH LAUNCHER 6FR JR4 (CATHETERS) IMPLANT
CATH OPTITORQUE TIG 4.0 5F (CATHETERS) IMPLANT
CATH SHOCKWAVE 3.5X12 (CATHETERS) IMPLANT
CATH SHOCKWAVE C2 2.5X12 (CATHETERS) IMPLANT
CATHETER SHOCKWAVE 3.5X12 (CATHETERS) ×2
ELECT DEFIB PAD ADLT CADENCE (PAD) IMPLANT
GLIDESHEATH SLEND SS 6F .021 (SHEATH) IMPLANT
GUIDEWIRE INQWIRE 1.5J.035X260 (WIRE) IMPLANT
INQWIRE 1.5J .035X260CM (WIRE) ×2
KIT ENCORE 26 ADVANTAGE (KITS) IMPLANT
KIT HEART LEFT (KITS) ×2 IMPLANT
PACK CARDIAC CATHETERIZATION (CUSTOM PROCEDURE TRAY) ×2 IMPLANT
STENT SYNERGY XD 3.50X20 (Permanent Stent) IMPLANT
SYNERGY XD 3.50X20 (Permanent Stent) ×2 IMPLANT
TRANSDUCER W/STOPCOCK (MISCELLANEOUS) ×2 IMPLANT
TUBING CIL FLEX 10 FLL-RA (TUBING) ×2 IMPLANT
WIRE COUGAR XT STRL 190CM (WIRE) IMPLANT

## 2021-12-06 NOTE — Progress Notes (Addendum)
Re-assumed care of pt, Mendel Ryder, NP at bedside, RUE remains elevated on pillow, Scott, RN to bedside and held manual pressure for approximately 5-6 minutes, per NP, keep pt in holding for a little while longer and monitor RFA, (pt held approximately 40 more minutes) safety maintained, see flowsheets, pt noted to have had poison ivy to rfa about 2 weeks ago, area pinkened and some scabbing noted

## 2021-12-06 NOTE — Progress Notes (Signed)
Right forearm swollen and sore to touch upon assessment. Radial pulse palpable. Manual blood pressure cuff applied to hematoma, inflated and deflated per policy for approximately 1 hour. Right arm remained elevated. Dr. Claiborne Billings notified.

## 2021-12-06 NOTE — Interval H&P Note (Signed)
Cath Lab Visit (complete for each Cath Lab visit)  Clinical Evaluation Leading to the Procedure:   ACS: No.  Non-ACS:    Anginal Classification: CCS II  Anti-ischemic medical therapy: Minimal Therapy (1 class of medications)  Non-Invasive Test Results: Intermediate-risk stress test findings: cardiac mortality 1-3%/year  Prior CABG: No previous CABG      History and Physical Interval Note:  12/06/2021 7:39 AM  Gene Reed  has presented today for surgery, with the diagnosis of ABNORMAL CTA.  The various methods of treatment have been discussed with the patient and family. After consideration of risks, benefits and other options for treatment, the patient has consented to  Procedure(s): LEFT HEART CATH AND CORONARY ANGIOGRAPHY (N/A) as a surgical intervention.  The patient's history has been reviewed, patient examined, no change in status, stable for surgery.  I have reviewed the patient's chart and labs.  Questions were answered to the patient's satisfaction.     Shelva Majestic

## 2021-12-07 ENCOUNTER — Encounter (HOSPITAL_COMMUNITY): Payer: Self-pay | Admitting: Cardiovascular Disease

## 2021-12-07 ENCOUNTER — Other Ambulatory Visit: Payer: Self-pay

## 2021-12-07 ENCOUNTER — Other Ambulatory Visit (HOSPITAL_COMMUNITY): Payer: Self-pay

## 2021-12-07 DIAGNOSIS — I25118 Atherosclerotic heart disease of native coronary artery with other forms of angina pectoris: Secondary | ICD-10-CM

## 2021-12-07 LAB — CBC
HCT: 30.5 % — ABNORMAL LOW (ref 39.0–52.0)
Hemoglobin: 10.6 g/dL — ABNORMAL LOW (ref 13.0–17.0)
MCH: 28.3 pg (ref 26.0–34.0)
MCHC: 34.8 g/dL (ref 30.0–36.0)
MCV: 81.6 fL (ref 80.0–100.0)
Platelets: 219 10*3/uL (ref 150–400)
RBC: 3.74 MIL/uL — ABNORMAL LOW (ref 4.22–5.81)
RDW: 14.5 % (ref 11.5–15.5)
WBC: 11.1 10*3/uL — ABNORMAL HIGH (ref 4.0–10.5)
nRBC: 0 % (ref 0.0–0.2)

## 2021-12-07 LAB — BASIC METABOLIC PANEL
Anion gap: 7 (ref 5–15)
BUN: 8 mg/dL (ref 6–20)
CO2: 23 mmol/L (ref 22–32)
Calcium: 8.5 mg/dL — ABNORMAL LOW (ref 8.9–10.3)
Chloride: 106 mmol/L (ref 98–111)
Creatinine, Ser: 0.75 mg/dL (ref 0.61–1.24)
GFR, Estimated: >60 mL/min (ref >60)
Glucose, Bld: 86 mg/dL (ref 70–99)
Potassium: 3.1 mmol/L — ABNORMAL LOW (ref 3.5–5.1)
Sodium: 136 mmol/L (ref 135–145)

## 2021-12-07 MED ORDER — TICAGRELOR 90 MG PO TABS
90.0000 mg | ORAL_TABLET | Freq: Two times a day (BID) | ORAL | 11 refills | Status: AC
  Filled 2021-12-07: qty 60, 30d supply, fill #0
  Filled 2022-01-04: qty 60, 30d supply, fill #1
  Filled 2022-04-03: qty 60, 30d supply, fill #2

## 2021-12-07 MED ORDER — POTASSIUM CHLORIDE CRYS ER 20 MEQ PO TBCR
40.0000 meq | EXTENDED_RELEASE_TABLET | Freq: Once | ORAL | Status: AC
  Administered 2021-12-07: 40 meq via ORAL
  Filled 2021-12-07: qty 2

## 2021-12-07 MED ORDER — ISOSORBIDE MONONITRATE ER 30 MG PO TB24
30.0000 mg | ORAL_TABLET | Freq: Every day | ORAL | 5 refills | Status: DC
  Filled 2021-12-07: qty 30, 30d supply, fill #0
  Filled 2022-01-04: qty 30, 30d supply, fill #1

## 2021-12-07 MED ORDER — ROSUVASTATIN CALCIUM 40 MG PO TABS
40.0000 mg | ORAL_TABLET | Freq: Every day | ORAL | 5 refills | Status: DC
  Filled 2021-12-07: qty 30, 30d supply, fill #0
  Filled 2022-01-04: qty 30, 30d supply, fill #1
  Filled 2022-02-08: qty 30, 30d supply, fill #2
  Filled 2022-04-03: qty 30, 30d supply, fill #3
  Filled 2022-05-09: qty 30, 30d supply, fill #4
  Filled 2022-06-28: qty 30, 30d supply, fill #5

## 2021-12-07 MED FILL — Heparin Sod (Porcine)-NaCl IV Soln 1000 Unit/500ML-0.9%: INTRAVENOUS | Qty: 1000 | Status: AC

## 2021-12-07 NOTE — Care Management (Signed)
  Transition of Care (TOC) Screening Note   Patient Details  Name: Gene Reed Date of Birth: [birth year]   Transition of Care [facility]) CM/SW Contact:    Bethena Roys, RN Phone Number: 12/07/2021, 10:03 AM    Transition of Care Department (TOC) has reviewed the patient and no TOC needs have been identified at this time. Case Manager spoke with patient and he has a PCP at the [facility] scheduled and information placed on the AVS. Patient follows up with the [facility] for medication assistance. Medications will be delivered today via [facility] to the bedside. Patient will receive the 30 day free Brilinta. After the 30 day free- patient will not be able to afford the [employer] on file- will either need assistance with the patient assistance application vs change to Plavix. Case Manager reaching out to [facility] to see if the patient will need MATCH assistance. No further needs identified at this time.

## 2021-12-07 NOTE — Progress Notes (Signed)
Upon administration of A.M medications, pt reported he's already taken the following meds, Amlodipine, ASA, and Crestor. He still had his home medications on him. Pt educated and demonstrated understanding about not taking home medications while in the hospital on admission.

## 2021-12-07 NOTE — Discharge Summary (Addendum)
The patient has been seen in conjunction with Ronie Spies, PA-C. All aspects of care have been considered and discussed. The patient has been personally interviewed, examined, and all clinical data has been reviewed. Initial plan is for medical therapy of moderate/severe LAD with aggressive risk factor modification and anti-ischemic therapy. Excellent result on the right coronary using shockwave. No symptoms overnight.  Radial cath site is unremarkable. Kidney function is stable. Plan discharge today with follow-up next week.  She did not work prior to initial follow-up in office.  The importance of aerobic activity is discussed. Ultimately will follow-up with Dr. Tresa Endo for decision about LAD PCI with plan medical therapy with the patient is minimally symptomatic.             Discharge Summary    Patient ID: Gene Reed MRN: [id number]; DOB: Aug 05, 1970  Admit date: 12/06/2021 Discharge date: 12/07/2021  PCP:  Ivonne Andrew, NP   [facility] Providers Cardiologist:  Nicki Guadalajara, MD        Discharge Diagnoses    Principal Problem:   CAD (coronary artery disease), native coronary artery Active Problems:   Hyperlipidemia   Hypertension   Abnormal cardiac CT angiography    Diagnostic Studies/Procedures     Cath 12/06/21   Prox RCA lesion is 90% stenosed.   Prox RCA to Mid RCA lesion is 20% stenosed.   Mid LM to Dist LM lesion is 15% stenosed.   Prox LAD to Mid LAD lesion is 40% stenosed.   Mid LAD-1 lesion is 75% stenosed.   Mid LAD-2 lesion is 40% stenosed.   Prox Cx lesion is 20% stenosed.   2nd Mrg lesion is 20% stenosed.   A drug-eluting stent was successfully placed.   Post intervention, there is a 0% residual stenosis.   Multivessel CAD with severely calcified proximal RCA stenosis of 90% followed by mid 20% stenosis in a dominant RCA which supplies the LV apex.   Diffusely calcified proximal LAD stenoses of 40% 75% and 40%.   Mild  nonobstructive disease in the distal left main and left circumflex vessel.   LVEDP 21 mmHg.   Difficult but successful PCI to the severely calcified proximal RCA with PTCA, scoring balloon, and intravascular lithotripsy and ultimate insertion of a 3.5 x 20 mm Synergy DES stent postdilated to 3.75 mm with the stenosis being reduced to 0% and successful lithotripsy with resolution of previous significant calcification.   RECOMMENDATION: DAPT for minimum of 12 months probably longer.  Increase medical therapy for concomitant LAD disease.  If patient has recurrent symptomatology will need staged PCI to LAD. _____________   History of Present Illness     Gene Reed is a 51 y.o. male with CAD noted on recent coronary CTA on 11/20/2021 with incidental adrenal nodule, mild MR by echo 09/2021, hypertension, hyperlipidemia, GERD, gout, chronic knee pain, OSA on CPAP who presented for outpatient catheterization. He was recently having episodes of intermittent sharp chest pain with exertion as well as progressive dyspnea on exertion. Echo 09/2021 showed EF 65-70%, mild LVH, normal diastolic parameters, mild MR, aortic sclerosis without stenosis. Recent coronary CTA on 11/21/2021 showed a coronary calcium score of 1,902 (99th percentile for age and sex) and possible severe stenosis of proximal RCA as well as moderate stenosis of LAD and LCX. FFR was unable to be processed. Dyspnea was felt potentially exacerbated by weight (and patient also attributed to recent occupational exposures), but outpatient cardiac cath was recommended.  Hospital Course     1. CAD with angina pectoris - cath as outlined above with difficult but successful PCI to prox RCA with PTCA, scoring balloon, intravascular lithotripsy and DES placement with otherwise diffusely calcified LAD disease and nonobstructive LM, LCx disease with recommendation for medical therapy, to consider staged PCI to LAD for recurrent symptoms - recommended  DAPT for minimum of 12 months, probably longer - now on ASA + Brilinta - continue PTA carvedilol 6.25mg  BID, PTA amlodipine 10mg  daily, Imdur 30mg  daily (new) - rosuvastatin increased to 40mg  daily - given carvedilol and rosuvastatin, patient advised to hold colchicine at discharge and discuss alternatives with PCP as colchicine increases risk of adverse effects of these medicines, including myopathy - patient does not presently have insurance and typically gets medicines [facility] - his [facility] rx's [facility]. PharmD recommended patient reach out to his usual pharmacy to inquire about Brilinta assistance. In the event that this proves unaffordable, Dr. Katrinka BlazingSmith has given permission to discuss transitioning to Plavix after 1 month at follow-up visit - he reports he would recommend 300mg  of Plavix WITH the last dose of Brilinta, then 75mg  daily thereafter - will keep f/u appt scheduled 12/12/21 with Marjie Skiffallie Goodrich. Dr. Katrinka BlazingSmith recommended he be seen in clinic prior to clearing for work, but feels that he should be able to return if doing well at that visit. He did encourage to keep the 01/2022 follow-up as well with Dr. Tresa EndoKelly to discuss whether staged PCI to LAD would necessary going forward   2. Hyperlipidemia - statin recently increased as above, will need OP f/u   3. Essential HTN - BP variable but this AM OK   4. Hypokalemia - K 3.1, potentially nutritionally related to NPO yesterday - usually 4.2 range so will give 40meq KCl now then suggest BMET at follow-up appt scheduled next week   5. Mild anemia, leukocytosis - suspect post-procedurally related, recommend repeating CBC at f/u visit  The patient feels well today. Dr. Katrinka BlazingSmith has seen and examined the patient today and feels he is stable for discharge. Of note, patient initially declined his ASA, amlodipine, rosuvastatin in nursing rounds this morning as he stated he will  take at home. Discussed with the nurse to encourage he reconsider and especially important to take the ASA as any delay in dosing could cause acute stent closure. Recommend reinforcement of med adherence in follow-up.      Did the patient have an acute coronary syndrome (MI, NSTEMI, STEMI, etc) this admission?:  No                               Did the patient have a percutaneous coronary intervention (stent / angioplasty)?:  Yes.     Cath/PCI Registry Performance & Quality Measures: Aspirin prescribed? - Yes ADP Receptor Inhibitor (Plavix/Clopidogrel, Brilinta/Ticagrelor or Effient/Prasugrel) prescribed (includes medically managed patients)? - Yes High Intensity Statin (Lipitor 40-80mg  or Crestor 20-40mg ) prescribed? - Yes For EF <40%, was ACEI/ARB prescribed? - Not Applicable (EF >/= 40%) For EF <40%, Aldosterone Antagonist (Spironolactone or Eplerenone) prescribed? - Not Applicable (EF >/= 40%) Cardiac Rehab Phase II ordered? - Yes         _____________  Discharge Vitals Blood pressure 130/80, pulse 85, temperature 98.1 F (36.7 C), temperature source Oral, resp. rate 19, height 5\' 9"  (1.753 m), weight 117.1 kg, SpO2 99 %.  Filed  Weights   12/06/21 0534 12/06/21 1427  Weight: 118.8 kg 117.1 kg   Morbidly obese. Chest is clear. Right radial cath site is unremarkable.  No ecchymosis or hematoma. Neurologically intact.  Labs & Radiologic Studies    CBC Recent Labs    12/07/21 0150  WBC 11.1*  HGB 10.6*  HCT 30.5*  MCV 81.6  PLT 219   Basic Metabolic Panel Recent Labs    01/60/10 0150  NA 136  K 3.1*  CL 106  CO2 23  GLUCOSE 86  BUN 8  CREATININE 0.75  CALCIUM 8.5*   _____________  CARDIAC CATHETERIZATION  Result Date: 12/06/2021   Prox RCA lesion is 90% stenosed.   Prox RCA to Mid RCA lesion is 20% stenosed.   Mid LM to Dist LM lesion is 15% stenosed.   Prox LAD to Mid LAD lesion is 40% stenosed.   Mid LAD-1 lesion is 75% stenosed.   Mid LAD-2 lesion is  40% stenosed.   Prox Cx lesion is 20% stenosed.   2nd Mrg lesion is 20% stenosed.   A drug-eluting stent was successfully placed.   Post intervention, there is a 0% residual stenosis. Multivessel CAD with severely calcified proximal RCA stenosis of 90% followed by mid 20% stenosis in a dominant RCA which supplies the LV apex. Diffusely calcified proximal LAD stenoses of 40% 75% and 40%. Mild nonobstructive disease in the distal left main and left circumflex vessel. LVEDP 21 mmHg. Difficult but successful PCI to the severely calcified proximal RCA with PTCA, scoring balloon, and intravascular lithotripsy and ultimate insertion of a 3.5 x 20 mm Synergy DES stent postdilated to 3.75 mm with the stenosis being reduced to 0% and successful lithotripsy with resolution of previous significant calcification. RECOMMENDATION: DAPT for minimum of 12 months probably longer.  Increase medical therapy for concomitant LAD disease.  If patient has recurrent symptomatology will need staged PCI to LAD.   CT CORONARY MORPH W/CTA COR W/SCORE W/CA W/CM &/OR WO/CM  Addendum Date: 11/20/2021   ADDENDUM REPORT: 11/20/2021 15:46 EXAM: OVER-READ INTERPRETATION  CT CHEST The following report is an over-read performed by radiologist Dr. Roque Lias [facility] on 11/20/2021. This over-read does not include interpretation of cardiac or coronary anatomy or pathology. The coronary CTA interpretation by the cardiologist is attached. COMPARISON:  October 01, 2021. FINDINGS: No definite abnormality seen involving the visualized portions of the extracardiac structures of the chest. Visualized mediastinum is unremarkable. 3 cm left adrenal nodule is noted. Stable scarring seen in the right lower lobe. Visualized skeleton is unremarkable. IMPRESSION: Stable right lower lobe scarring is noted. 3 cm left adrenal nodule is noted. Further evaluation with CT scan or MRI according to adrenal protocol is recommended. Electronically  Signed   By: Lupita Raider M.D.   On: 11/20/2021 15:46   Result Date: 11/20/2021 CLINICAL DATA:  51 year old with chest pain EXAM: Cardiac/Coronary  CTA TECHNIQUE: The patient was scanned on a [employer]. FINDINGS: A 120 kV prospective scan was triggered in the descending thoracic aorta at 111 HU's. Axial non-contrast 3 mm slices were carried out through the heart. The data set was analyzed on a dedicated work station and scored using the Agatson method. Gantry rotation speed was 250 msecs and collimation was .6 mm. 0.8 mg of sl NTG was given. The 3D data set was reconstructed in 5% intervals of the 67-82 % of the R-R cycle. Diastolic phases were analyzed on a dedicated work station  using MPR, MIP and VRT modes. The patient received 80 cc of contrast. Image quality: fair Aorta:  Normal size.  No calcifications.  No dissection. Aortic Valve:  Trileaflet.  Mild calcifications. Coronary Arteries:  Normal coronary origin.  Right dominance. RCA is a large dominant artery that gives rise to PDA and PLA. There is severe calcified plaque with possible proximal severe stenosis 70-99%. Left main is a large artery that gives rise to LAD and LCX arteries. 0-24% minimal LM calcified stenosis. LAD is a large vessel that has scattered calcified plaque, 50-69% stenosis moderate. LCX is a non-dominant artery that gives rise to one large OM1 branch. There is scattered calcified plaque moderate mid stenosis 50-69%. Other findings: Normal pulmonary vein drainage into the left atrium. Normal left atrial appendage without a thrombus. Normal size of the pulmonary artery. Please see radiology report for non cardiac findings. IMPRESSION: 1. Coronary calcium score of 1902. This was 3 percentile for age and sex matched control. 2. Normal coronary origin with right dominance. 3. Possible severe proximal RCA stenosis, moderate LAD and LCX stenosis. Will send for FFR analysis if able to process. Electronically Signed: By: Donato Schultz M.D. On: 11/20/2021 14:22   Disposition   Pt is being discharged home today in good condition.  Follow-up Plans & Appointments     Follow-up Information     Corrin Parker, PA-C Follow up.   Specialties: Physician Assistant, Cardiology Why: Keep follow-up as scheduled with Marjie Skiff on Wednesday Dec 12, 2021 1:55 PM (Arrive by 1:40 PM). Contact information: [address] [place] [address] [phone]                Discharge Instructions     Diet - low sodium heart healthy   Complete by: As directed    Discharge instructions   Complete by: As directed    You were started on the new medicines isosorbide and ticagrelor (Brilinta).  Your rosuvastatin dose was increased further given your heart blockages on catheterization.  If you notice any bleeding such as blood in stool, black tarry stools, blood in urine, nosebleeds or any other unusual bleeding, call your doctor immediately. It is not normal to have this kind of bleeding while on a blood thinner and usually indicates there is an underlying problem with one of your body systems that needs to be checked out.   We stopped your colchicine since it can interact with your rosuvastatin and carvedilol. You can continue your allopurinol for gout. Please discuss alternatives to colchicine with the doctor that prescribed it if you develop recurrent issues with gout.   Increase activity slowly   Complete by: As directed    No driving for 2 days. No lifting over 5 lbs for 1 week. No sexual activity for 1 week. You should not return to work until cleared at your follow-up visit on 12/12/21. Keep procedure site clean & dry. If you notice increased pain, swelling, bleeding or pus, call/return!  You may shower, but no soaking baths/hot tubs/pools for 1 week.        Discharge Medications   Allergies as of 12/07/2021       Reactions   Poison Ivy Extract Itching        Medication List     STOP  taking these medications    colchicine 0.6 MG tablet       TAKE these medications    allopurinol 100 MG tablet Commonly known as: ZYLOPRIM TAKE  1 TABLET (100 MG TOTAL) BY MOUTH DAILY.   amLODipine 10 MG tablet Commonly known as: NORVASC TAKE 1 TABLET (10 MG TOTAL) BY MOUTH DAILY.   aspirin EC 81 MG tablet Take 1 tablet (81 mg total) by mouth daily. Swallow whole.   carvedilol 6.25 MG tablet Commonly known as: COREG Take 1 tablet (6.25 mg total) by mouth 2 (two) times daily.   isosorbide mononitrate 30 MG 24 hr tablet Commonly known as: IMDUR Take 1 tablet (30 mg total) by mouth daily. Start taking on: December 08, 2021   nitroGLYCERIN 0.4 MG SL tablet Commonly known as: NITROSTAT Place 1 tablet (0.4 mg total) under the tongue every 5 (five) minutes as needed for chest pain.   omeprazole 20 MG capsule Commonly known as: PRILOSEC TAKE 1 CAPSULE (20 MG TOTAL) BY MOUTH DAILY.   rosuvastatin 40 MG tablet Commonly known as: CRESTOR Take 1 tablet (40 mg total) by mouth daily. What changed:  medication strength how much to take   ticagrelor 90 MG Tabs tablet Commonly known as: BRILINTA Take 1 tablet (90 mg total) by mouth 2 (two) times daily.           Outstanding Labs/Studies   Suggest BMET/CBC to be ordered at f/u visit  Duration of Discharge Encounter   Greater than 30 minutes including physician time.  Signed, Charlie Pitter, PA-C 12/07/2021, 9:43 AM

## 2021-12-07 NOTE — Progress Notes (Signed)
Initial plan is for medical therapy of moderate/severe LAD with aggressive risk factor modification and anti-ischemic therapy. Excellent result on the right coronary using shockwave. No symptoms overnight.  Radial cath site is unremarkable. Kidney function is stable. Plan discharge today with follow-up next week.  She did not work prior to initial follow-up in office.  The importance of aerobic activity is discussed. Ultimately will follow-up with Dr. Claiborne Billings for decision about LAD PCI with plan medical therapy with the patient is minimally symptomatic.

## 2021-12-07 NOTE — Progress Notes (Addendum)
CARDIAC REHAB PHASE I   PRE:  Rate/Rhythm: 84 NSR  BP:  Sitting: 135/83      SaO2: 99 RA  MODE:  Ambulation: 470 ft   POST:  Rate/Rhythm: 98 NSR  BP:  Sitting: 145/78      SaO2: 98 RA  Pt ambulated the hallway with standby assistance w/o cp and 1/10 sob. Pt was returned to his room and educated on stent card, stent location, asa and birlinta, restrictions, ex guidelines, ntg use and calling 911, risk factors (smoking, drinking, LDLs under 70,)  and heart healthy diet. CRPII will become available pending insurance. Referral sent to meet guidelines.   Christen Bame  9:08 AM 12/07/2021

## 2021-12-08 LAB — LIPOPROTEIN A (LPA): Lipoprotein (a): 113.6 nmol/L — ABNORMAL HIGH (ref <75.0)

## 2021-12-08 NOTE — Progress Notes (Unsigned)
Cardiology Office Note:    Date:  12/12/2021   ID:  Sherry Ruffing, DOB July 24, 1970, MRN [id number]  PCP:  Fenton Foy, NP  Cardiologist:  Shelva Majestic, MD  Electrophysiologist:  None   Referring MD: Fenton Foy, NP   Chief Complaint: follow-up of recent PCI  History of Present Illness:    RAMESSES CRAMPTON is a 51 y.o. male with a history of  CAD s/p PTCA/DES of RCA on 12/06/2021,  hypertension, hyperlipdiemia, adrenal nodule noted on CTA in 11/2021, GERD, gout, and chronic knee pain who is followed by Dr. Claiborne Billings who presents today for follow-up of recent PCI.   Patient was recently referred to Dr. Claiborne Billings in 09/2021 for further evaluation of intermittent chest pain. Chest pain sounded rather atypical and musculoskeletal in nature. He denied any exertional symptoms. He also reported dyspepsia at times. Coronary calcium score and Echo were ordered for further evaluation. Echo showed LVEF of 65-70% with no regional wall motion abnormalities, mild LVH, and normal diastolic parameters as well as mild MR. Coronary calcium score came back at 1,803 (99th percentile for age and sex). Therefore, a coronary CTA was ordered and showed possible severe proximal RCA stenosis and moderate LAD and LCX stenosis. FFR was sent but was unfortunately unable to be processed. CTA also incidentally revealed a 3cm left adrenal nodule for which additional imaging was recommended. He was seen by me on 11/29/2021 for follow-up at which time he was started on Aspirin and Coreg and his Crestor was increased. He was also set up for a cardiac catheterization. He underwent a LHC on 12/06/2021 which showed 15% stenosis of the mid to distal left main, 90% stenosis of proximal RCA followed by 20% stenosis of proximal to mid RCA, 40% stenosis of proximal to mid LAD followed by 75% and then 40% stenoses of the mid LAD, 20% stenosis of proximal LCX, and 20% stenosis of OM2. He underwent a difficult but successful PCI to the severely  calcified proximal RCA with PTCA, scoring balloon, intravascular lithotripsy, and ultimate insertion of a DES. LAD disease was treated medically with plans for staged PCI to LAD if he has recurrent angina. He was started on DAPT with Aspirin and Brilinta as well as Imdur. He was admitted overnight following cath and did well so was able to be discharged the following day.  Patient presents today for follow-up. Overall, he is doing well. He reports some occasional atypical brief chest pain when twisting a certain way or if he lays on his chest at night which sounds musculoskeletal in nature. However, he denies any of the chest burning/pressure sensation he was having prior to PCI. He states he did take 2 doses of Nitroglycerin shortly after leaving the hospital due to a "weird" sensation in his chest but has not required any since. He still has some dyspnea when he over exerts himself but this has also improved. No shortness of breath oat rest. He has stable orthopnea but no PND or edema. No palpitations. He reports occasional brief lightheadedness when standing but nothing significant. No dizziness or syncope. He is tolerating DAPT well. He does states his stools have been darker than usual but not no black or tarry looking stools. No obvious bleeding in urine or stools. He still has some bruising of right radial cath sight and some tenderness with palpation of this area but it is getting better. He has good grip strength and denies any numbness or tingling in that hand.  Past Medical History:  Diagnosis Date   Adrenal nodule (HCC)    Arthritis    Bilateral chronic knee pain    Bone spur    CAD (coronary artery disease)    Gout    Hyperlipidemia    Hypertension     Past Surgical History:  Procedure Laterality Date   CORONARY LITHOTRIPSY N/A 12/06/2021   Procedure: CORONARY LITHOTRIPSY;  Surgeon: Troy Sine, MD;  Location: [facility];  Service: Cardiovascular;  Laterality: N/A;    CORONARY STENT INTERVENTION Right 12/06/2021   Procedure: CORONARY STENT INTERVENTION;  Surgeon: Troy Sine, MD;  Location: [facility];  Service: Cardiovascular;  Laterality: Right;   KNEE SURGERY     LEFT HEART CATH AND CORONARY ANGIOGRAPHY N/A 12/06/2021   Procedure: LEFT HEART CATH AND CORONARY ANGIOGRAPHY;  Surgeon: Troy Sine, MD;  Location: [facility];  Service: Cardiovascular;  Laterality: N/A;    Current Medications: Current Meds  Medication Sig   allopurinol (ZYLOPRIM) 100 MG tablet TAKE 1 TABLET (100 MG TOTAL) BY MOUTH DAILY.   amLODipine (NORVASC) 10 MG tablet TAKE 1 TABLET (10 MG TOTAL) BY MOUTH DAILY.   aspirin EC 81 MG tablet Take 1 tablet (81 mg total) by mouth daily. Swallow whole.   carvedilol (COREG) 6.25 MG tablet Take 1 tablet (6.25 mg total) by mouth 2 (two) times daily.   isosorbide mononitrate (IMDUR) 30 MG 24 hr tablet Take 1 tablet (30 mg total) by mouth daily.   nitroGLYCERIN (NITROSTAT) 0.4 MG SL tablet Place 1 tablet (0.4 mg total) under the tongue every 5 (five) minutes as needed for chest pain.   omeprazole (PRILOSEC) 20 MG capsule TAKE 1 CAPSULE (20 MG TOTAL) BY MOUTH DAILY.   rosuvastatin (CRESTOR) 40 MG tablet Take 1 tablet (40 mg total) by mouth daily.   ticagrelor (BRILINTA) 90 MG TABS tablet Take 1 tablet (90 mg total) by mouth 2 (two) times daily.     Allergies:   Poison ivy extract   Social History   Socioeconomic History   Marital status: Widowed    Spouse name: Not on file   Number of children: Not on file   Years of education: Not on file   Highest education level: Not on file  Occupational History   Not on file  Tobacco Use   Smoking status: Former   Smokeless tobacco: Never  Vaping Use   Vaping Use: Never used  Substance and Sexual Activity   Alcohol use: Yes    Comment:  6 beers per a day    Drug use: Yes    Types: Marijuana    Comment: 1 joint a day    Sexual activity: Not Currently  Other Topics Concern    Not on file  Social History Narrative   Not on file   Social Determinants of Health   Financial Resource Strain: Medium Risk (12/12/2021)   Overall Financial Resource Strain (CARDIA)    Difficulty of Paying Living Expenses: Somewhat hard  Food Insecurity: No Food Insecurity (12/12/2021)   Hunger Vital Sign    Worried About Running Out of Food in the Last Year: Never true    Ran Out of Food in the Last Year: Never true  Transportation Needs: No Transportation Needs (12/12/2021)   PRAPARE - Hydrologist (Medical): No    Lack of Transportation (Non-Medical): No  Physical Activity: Not on file  Stress: Not on file  Social Connections:  Not on file     Family History: The patient's family history is not on file.  ROS:   Please see the history of present illness.     EKGs/Labs/Other Studies Reviewed:    The following studies were reviewed:  Echocardiogram 09/28/2021: Impressions: 1. Left ventricular ejection fraction, by estimation, is 65 to 70%. The  left ventricle has normal function. The left ventricle has no regional  wall motion abnormalities. There is mild left ventricular hypertrophy.  Left ventricular diastolic parameters  were normal.   2. Right ventricular systolic function is normal. The right ventricular  size is normal.   3. The mitral valve is normal in structure. Mild mitral valve  regurgitation.   4. The aortic valve is tricuspid. Aortic valve regurgitation is not  visualized. Aortic valve sclerosis/calcification is present, without any  evidence of aortic stenosis.   5. The inferior vena cava is normal in size with greater than 50%  respiratory variability, suggesting right atrial pressure of 3 mmHg.  _______________   CT Cardiac Scoring 09/28/2021: 1. Coronary calcium score of 1803. This was 99th percentile for age-, race-, and sex-matched controls. 2.  Mild dilatation of ascending aorta measuring 70m 3.  Mild aortic valve  calcifications _______________   Coronary CTA 11/20/2021: Impression: 1. Coronary calcium score of 1902. This was 926percentile for age and sex matched control. 2. Normal coronary origin with right dominance. 3. Possible severe proximal RCA stenosis, moderate LAD and LCX stenosis. Will send for FFR analysis if able to process.   Over-Read of Chest CT: - Stable right lower lobe scarring is noted. - 3 cm left adrenal nodule is noted. Further evaluation with CT scan or MRI according to adrenal protocol is recommended. _______________  Left Cardiac Catheterization 12/06/2021:   Prox RCA lesion is 90% stenosed.   Prox RCA to Mid RCA lesion is 20% stenosed.   Mid LM to Dist LM lesion is 15% stenosed.   Prox LAD to Mid LAD lesion is 40% stenosed.   Mid LAD-1 lesion is 75% stenosed.   Mid LAD-2 lesion is 40% stenosed.   Prox Cx lesion is 20% stenosed.   2nd Mrg lesion is 20% stenosed.   A drug-eluting stent was successfully placed.   Post intervention, there is a 0% residual stenosis.   Multivessel CAD with severely calcified proximal RCA stenosis of 90% followed by mid 20% stenosis in a dominant RCA which supplies the LV apex.   Diffusely calcified proximal LAD stenoses of 40% 75% and 40%.   Mild nonobstructive disease in the distal left main and left circumflex vessel.   LVEDP 21 mmHg.   Difficult but successful PCI to the severely calcified proximal RCA with PTCA, scoring balloon, and intravascular lithotripsy and ultimate insertion of a 3.5 x 20 mm Synergy DES stent postdilated to 3.75 mm with the stenosis being reduced to 0% and successful lithotripsy with resolution of previous significant calcification.   Recommendations: DAPT for minimum of 12 months probably longer.  Increase medical therapy for concomitant LAD disease.  If patient has recurrent symptomatology will need staged PCI to LAD.  Diagnostic Dominance: Right  Intervention        EKG:  EKG  ordered today.  EKG personally reviewed and demonstrates normal sinus rhythm, rate 88 bpm, with known RBBB but no acute ischemic changes compared to prior tracing. Normal axis. QTc 484 ms. No significant changes compared to prior tracing.   Recent Labs: 04/06/2021: TSH 2.570 08/30/2021: ALT 41 12/07/2021: BUN  8; Creatinine, Ser 0.75; Hemoglobin 10.6; Platelets 219; Potassium 3.1; Sodium 136  Recent Lipid Panel    Component Value Date/Time   CHOL 181 04/06/2021 1058   TRIG 245 (H) 04/06/2021 1058   HDL 42 04/06/2021 1058   CHOLHDL 4.3 04/06/2021 1058   LDLCALC 97 04/06/2021 1058    Physical Exam:    Vital Signs: BP 116/62 (BP Location: Left Arm, Patient Position: Sitting)   Pulse 88   Ht _0  (1.753 m)   Wt 261 lb 12.8 oz (118.8 kg)   SpO2 94%   BMI 38.66 kg/m     Wt Readings from Last 3 Encounters:  12/12/21 261 lb 12.8 oz (118.8 kg)  12/06/21 258 lb 1.6 oz (117.1 kg)  11/29/21 262 lb 3.2 oz (118.9 kg)     General: 51 y.o. obese [place]-American male in no acute distress. HEENT: Normocephalic and atraumatic. Sclera clear.  Neck: Supple. No carotid bruits. No JVD. Heart: RRR. Distinct S1 and S2. No murmurs, gallops, or rubs. Radial pulses 2+ and equal bilaterally. Right radial cath soft with resolving hematoma. Good grip strength. Lungs: No increased work of breathing. Clear to ausculation bilaterally. No wheezes, rhonchi, or rales.  Abdomen: Soft, non-distended, and non-tender to palpation. Extremities: No lower extremity edema.    Skin: Warm and dry. Neuro: Alert and oriented x3. No focal deficits. Psych: Normal affect. Responds appropriately.   Assessment:    1. Coronary artery disease involving native coronary artery of native heart without angina pectoris   2. Primary hypertension   3. Hyperlipidemia, unspecified hyperlipidemia type   4. Hypokalemia   5. Anemia, unspecified type   6. Leukocytosis, unspecified type     Plan:    CAD Recent coronary CTA on 11/21/2021 showed a  coronary calcium score of 1,902 (99th percentile for age and sex) and possible severe stenosis of proximal RCA as well as moderate stenosis of LAD and LCX. He underwent LHC on 12/06/2021 with difficult but successful PCI with DES to proximal RCA. He also had moderate LAD which was treated medically with plans for staged PCI if he has recurrent symptoms. - Overall doing well since PCI. He describes some atypical pain that sounds musculoskeletal in nature but nothing that sounds like angina. He still has some mild tenderness to palpation of medial aspect of anterior lower forearm with resolving hematoma. However, he has good radial pulses, good grip strength, and no numbness/tinglings in fingers. - Continue Imdur 45m daily and Coreg 6.233mtwice daily. - Continue DAPT with Aspirin 8168maily and Brilinta 46m20mice daily. - Continue Crestor 40mg61mly. - Increase physical activity as tolerated. Recommend Cardiac Rehab. OK to go back to work but advised him not to lift more than 20 lbs for an additional week. - Patient has follow-up with Dr. KellyClaiborne Billings2/05/2021. Can discuss timing of possible staged PCI of LAD at that time depending on how he is doing. Provided a work note.  Of note, patient is currently uninsured and gets his medications through the [facility] patient to call them today to see if he can get any assistance with the Brilinta. If not, will plan to switch to Plavix after one month. Per Dr. SmithThompson Caulmmendations, would load with Plavix 300mg 32m the last dose of Brilinta and then start Plavix 75mg d59m the following day.   Hypertension BP well controlled.  - Continue current medications: Amlodipine 10mg da51m Coreg 6.25mg twi39maily, and Imdur 30mg dail38m Hyperlipidemia  Lipid panel in 04/2021: Total Cholesterol 181, Triglycerides 245, HDL 42, LDL 97. LDL goal <70 given CAD. - Crestor was increased to 75m daily after cardiac catheterization.  Continue. - Will repeat lipid panel and LFTs in about 6 weeks. Of note, she does have a history of elevated LFTs. Alk Phos and AST were mildly elevated on last check in 08/2021 so will need to watch this closely.  Hypokalemia Mildly hypokalemic with potassium of 3.1 on 12/07/2021.  - Will repeat BMET today.   Mild Anemia Leukocytosis CBC on 12/07/2021 following cath showed hemoglobin of 10.6 and WBC of 11.1. - Will repeat CBC today.    Adrenal Nodule Coronary CTA incidentally revealed 3cm left adrenal nodule. Radiologist recommended further evaluation with CT or MRI according to adrenal protocol - I offered to order additional imaging or told him that he could discuss with PCP. He preferred to discuss with PCP so she could continue to monitor in the future. I will notify PCP.   Disposition: Follow up in 1 month with Dr. KClaiborne Billingsas scheduled.   Medication Adjustments/Labs and Tests Ordered: Current medicines are reviewed at length with the patient today.  Concerns regarding medicines are outlined above.  Orders Placed This Encounter  Procedures   Basic metabolic panel   CBC   Lipid panel   Hepatic function panel   EKG 12-Lead   No orders of the defined types were placed in this encounter.   Patient Instructions  Medication Instructions:  PLEASE CALL PHARMACY AND SEE WHAT THE COST OF YOUR [place] BE AND CALL [place]. *If you need a refill on your cardiac medications before your next appointment, please call your pharmacy*   Lab Work: BMET AND CBC TODAY AND  FASTING LIPID AND LFT IN 2 MONTHS  If you have labs (blood work) drawn today and your tests are completely normal, you will receive your results only by:  [place](if you have MyChart) OR A paper copy in the mail.   If you have any lab test that is abnormal or we need to change your treatment, we will call you to review the results.  Follow-Up: [facility] you and your health needs  are our priority.  As part of our continuing mission to provide you with exceptional heart care, we have created designated Provider Care Teams.  These Care Teams include your primary Cardiologist (physician) and Advanced Practice Providers (APPs -  Physician Assistants and Nurse Practitioners) who all work together to provide you with the care you need, when you need it.  We recommend signing up for the patient portal called "MyChart".  Sign up information is provided on this After Visit Summary.  MyChart is used to connect with patients for Virtual Visits (Telemedicine).  Patients are able to view lab/test results, encounter notes, upcoming appointments, etc.  Non-urgent messages can be sent to your provider as well.   To learn more about what you can do with MyChart, go to [email or web address]    Your next appointment:   KEEP SCHEDULED APPOINTMENT (01-07-22)  The format for your next appointment:   In Person  Provider:   TShelva Majestic MD     Important Information About Sugar         Signed, CDarreld Mclean PA-C  12/12/2021 10:11 PM    [place]

## 2021-12-11 ENCOUNTER — Encounter: Payer: Self-pay | Admitting: Student

## 2021-12-11 ENCOUNTER — Other Ambulatory Visit: Payer: Self-pay

## 2021-12-12 ENCOUNTER — Encounter: Payer: Self-pay | Admitting: *Deleted

## 2021-12-12 ENCOUNTER — Encounter: Payer: Self-pay | Admitting: Student

## 2021-12-12 ENCOUNTER — Ambulatory Visit: Payer: Self-pay | Attending: Student | Admitting: Student

## 2021-12-12 ENCOUNTER — Inpatient Hospital Stay: Payer: Self-pay | Admitting: Nurse Practitioner

## 2021-12-12 VITALS — BP 116/62 | HR 88 | Ht 69.0 in | Wt 261.8 lb

## 2021-12-12 DIAGNOSIS — E785 Hyperlipidemia, unspecified: Secondary | ICD-10-CM

## 2021-12-12 DIAGNOSIS — E876 Hypokalemia: Secondary | ICD-10-CM

## 2021-12-12 DIAGNOSIS — I2510 Atherosclerotic heart disease of native coronary artery without angina pectoris: Secondary | ICD-10-CM

## 2021-12-12 DIAGNOSIS — D72829 Elevated white blood cell count, unspecified: Secondary | ICD-10-CM

## 2021-12-12 DIAGNOSIS — I10 Essential (primary) hypertension: Secondary | ICD-10-CM

## 2021-12-12 DIAGNOSIS — D649 Anemia, unspecified: Secondary | ICD-10-CM

## 2021-12-12 NOTE — Progress Notes (Signed)
Heart and Vascular Care Navigation  12/12/2021  Gene Reed Jun 27, 1970 [id number]  Reason for Referral:  Patient is participating in a Managed Medicaid Plan: No, self pay only.   Engaged with patient face to face for initial visit for Heart and Vascular Care Coordination.                                                                                                   Assessment:            LCSW spoke with pt at the end of his appt here at [facility] today. Introduced self, role, reason for visit. Pt confirmed home address, emergency contact and no current insurance. He shares that his wife passed last year. He still has family close by that he receives support from. Pt works, but income variable from that job as it is based on volume of work available. Pt does have some income left from his wife and states that he has some other assets. He is not sure if this will make him ineligible for assistance. We reviewed [employer] and [employer] and how to complete. Pt will work on this and understands if he has further questions he can contact me or my colleague Fredonia Highland, at PCP office. We also discussed utilizing [employer] to sign up for [employer] as there is currently open enrollment for commercial plans.   Pt otherwise able to afford and obtain his medications. He has completed Brilinta PAP. No additional cost of living concerns noted today, pt has transportation to any upcoming appts.   I encouraged him to contact me as needed.                               HRT/VAS Care Coordination     Patients Home Cardiology Office [facility]   Outpatient Care Team Social Worker   Social Worker Name: Nile Riggs, [place] [facility] [phone]   Living arrangements for the past 2 months Single Family Home   Lives with: Self   Patient Current Insurance Coverage Self-Pay   Patient Has Concern With Paying Medical Bills Yes   Patient Concerns  With Medical Bills no insurance, ongoing medical w/u and hospital stay recently   Medical Bill Referrals: CAFA and [employer] [facility]   Does Patient Have Prescription Coverage? No   Patient Prescription Assistance Programs Patient Assistance Programs       Social History:                                                                             SDOH Screenings   Food Insecurity: No Food Insecurity (12/12/2021)  Housing: Low Risk  (12/12/2021)  Transportation Needs: No Transportation Needs (12/12/2021)  Utilities: Not At  Risk (12/12/2021)  Depression (PHQ2-9): Low Risk  (08/30/2021)  Financial Resource Strain: Medium Risk (12/12/2021)  Tobacco Use: Medium Risk (12/12/2021)    SDOH Interventions: Financial Resources:  Financial Strain Interventions: Other (Comment) (CAFA as stress related mostly to hospital bill) Financial Counseling for [employer] Program  Food Insecurity:  Food Insecurity Interventions: Intervention Not Indicated  Housing Insecurity:  Housing Interventions: Intervention Not Indicated  Transportation:   Transportation Interventions: Intervention Not Indicated    Other Care Navigation Interventions:     Provided Pharmacy assistance resources Patient Assistance Programs   Follow-up plan:   LCSW provided pt with my card, [employer], [employer], [employer] card through [employer]. I encouraged him to contact me, or ask for my colleague Fredonia Highland, at [facility] during his appt next week.

## 2021-12-12 NOTE — Patient Instructions (Signed)
Medication Instructions:  PLEASE CALL PHARMACY AND SEE WHAT THE COST OF YOUR BRILINTA WILL BE AND CALL us LAN LET us KNOW. *If you need a refill on your cardiac medications before your next appointment, please call your pharmacy*   Lab Work: BMET AND CBC TODAY AND  FASTING LIPID AND LFT IN 2 MONTHS  If you have labs (blood work) drawn today and your tests are completely normal, you will receive your results only by:  MyChart Message (if you have MyChart) OR A paper copy in the mail.   If you have any lab test that is abnormal or we need to change your treatment, we will call you to review the results.  Follow-Up: At [facility], you and your health needs are our priority.  As part of our continuing mission to provide you with exceptional heart care, we have created designated Provider Care Teams.  These Care Teams include your primary Cardiologist (physician) and Advanced Practice Providers (APPs -  Physician Assistants and Nurse Practitioners) who all work together to provide you with the care you need, when you need it.  We recommend signing up for the patient portal called "MyChart".  Sign up information is provided on this After Visit Summary.  MyChart is used to connect with patients for Virtual Visits (Telemedicine).  Patients are able to view lab/test results, encounter notes, upcoming appointments, etc.  Non-urgent messages can be sent to your provider as well.   To learn more about what you can do with MyChart, go to [email or web address].    Your next appointment:   KEEP SCHEDULED APPOINTMENT (01-07-22)  The format for your next appointment:   In Person  Provider:   Nicki Guadalajara, MD     Important Information About Sugar

## 2021-12-13 LAB — BASIC METABOLIC PANEL
BUN/Creatinine Ratio: 13 (ref 9–20)
BUN: 11 mg/dL (ref 6–24)
CO2: 24 mmol/L (ref 20–29)
Calcium: 9.2 mg/dL (ref 8.7–10.2)
Chloride: 104 mmol/L (ref 96–106)
Creatinine, Ser: 0.85 mg/dL (ref 0.76–1.27)
Glucose: 83 mg/dL (ref 70–99)
Potassium: 4.7 mmol/L (ref 3.5–5.2)
Sodium: 140 mmol/L (ref 134–144)
eGFR: 106 mL/min/{1.73_m2} (ref >59)

## 2021-12-13 LAB — CBC
Hematocrit: 31.6 % — ABNORMAL LOW (ref 37.5–51.0)
Hemoglobin: 11 g/dL — ABNORMAL LOW (ref 13.0–17.7)
MCH: 28.7 pg (ref 26.6–33.0)
MCHC: 34.8 g/dL (ref 31.5–35.7)
MCV: 83 fL (ref 79–97)
Platelets: 285 10*3/uL (ref 150–450)
RBC: 3.83 x10E6/uL — ABNORMAL LOW (ref 4.14–5.80)
RDW: 14 % (ref 11.6–15.4)
WBC: 10.1 10*3/uL (ref 3.4–10.8)

## 2021-12-17 ENCOUNTER — Other Ambulatory Visit: Payer: Self-pay

## 2021-12-19 ENCOUNTER — Other Ambulatory Visit: Payer: Self-pay

## 2021-12-19 ENCOUNTER — Encounter: Payer: Self-pay | Admitting: Nurse Practitioner

## 2021-12-19 ENCOUNTER — Ambulatory Visit (INDEPENDENT_AMBULATORY_CARE_PROVIDER_SITE_OTHER): Payer: Self-pay | Admitting: Nurse Practitioner

## 2021-12-19 VITALS — BP 128/76 | HR 85 | Ht 69.0 in | Wt 256.0 lb

## 2021-12-19 DIAGNOSIS — L03116 Cellulitis of left lower limb: Secondary | ICD-10-CM

## 2021-12-19 DIAGNOSIS — E278 Other specified disorders of adrenal gland: Secondary | ICD-10-CM

## 2021-12-19 MED ORDER — CEPHALEXIN 500 MG PO CAPS
500.0000 mg | ORAL_CAPSULE | Freq: Two times a day (BID) | ORAL | 0 refills | Status: AC
  Filled 2021-12-19: qty 14, 7d supply, fill #0

## 2021-12-19 NOTE — Progress Notes (Signed)
@Patient  ID: , male    DOB: [birth year], 51 y.o.   MRN: [id number]  Chief Complaint  Patient presents with   hospital f/u    Hospital f/u- heart stent.    Referring provider: [id number], NP  Recent significant events: admission 12/06/21  Hospital course:  1. CAD with angina pectoris - cath as outlined above with difficult but successful PCI to prox RCA with PTCA, scoring balloon, intravascular lithotripsy and DES placement with otherwise diffusely calcified LAD disease and nonobstructive LM, LCx disease with recommendation for medical therapy, to consider staged PCI to LAD for recurrent symptoms - recommended DAPT for minimum of 12 months, probably longer - now on ASA + Brilinta - continue PTA carvedilol 6.25mg  BID, PTA amlodipine 10mg  daily, Imdur 30mg  daily (new) - rosuvastatin increased to 40mg  daily - given carvedilol and rosuvastatin, patient advised to hold colchicine at discharge and discuss alternatives with PCP as colchicine increases risk of adverse effects of these medicines, including myopathy - patient does not presently have insurance and typically gets medicines through the 13/2/23 and - his new rx's were prescribed this admission through [facility] pharmacy. PharmD recommended patient reach out to his usual pharmacy to inquire about Brilinta assistance. In the event that this proves unaffordable, Dr. has given permission to discuss transitioning to Plavix after 1 month at follow-up visit - he reports he would recommend 300mg  of Plavix WITH the last dose of Brilinta, then 75mg  daily thereafter - will keep f/u appt scheduled 12/12/21 with [employer]. Dr. [facility] recommended he be seen in clinic prior to clearing for work, but feels that he should be able to return if doing well at that visit. He did encourage to keep the 01/2022 follow-up as well with Dr. to discuss whether staged PCI to LAD would necessary going forward   2.  Hyperlipidemia - statin recently increased as above, will need OP f/u   3. Essential HTN - BP variable but this AM OK   4. Hypokalemia - K 3.1, potentially nutritionally related to NPO yesterday - usually 4.2 range so will give KCl    5. Mild anemia, leukocytosis - suspect post-procedurally related     HPI  Patient presents today for hospital follow-up see notes above.  Patient has already followed up with cardiology and did have labs on 12/12/2021.  Patient states that he has been doing well since discharge and does have family helping him to sort his medications.  It was noted that patient did have incidental finding of adrenal nodule.  We will refer patient to endocrinology for further evaluation. Denies f/c/s, n/v/d, hemoptysis, PND, leg swelling Denies chest pain or edema  Patient does note today that he has been having some leg swelling to his left lower extremity.  This is located around the shin area.  We discussed that we will trial Keflex for this today.     Allergies  Allergen Reactions   Poison Ivy Extract Itching    Immunization History  Administered Date(s) Administered   PFIZER(Purple Top)SARS-COV-2 Vaccination 06/22/2019, 07/25/2019   Tdap 09/21/2015    Past Medical History:  Diagnosis Date   Adrenal nodule (HCC)    Arthritis    Bilateral chronic knee pain    Bone spur    CAD (coronary artery disease)    Gout    Hyperlipidemia    Hypertension     Tobacco History: Social History   Tobacco Use  Smoking Status Former  Smokeless Tobacco Never   Counseling given: Not Answered   Outpatient Encounter Medications as of 12/19/2021  Medication Sig   allopurinol (ZYLOPRIM) 100 MG tablet TAKE 1 TABLET (100 MG TOTAL) BY MOUTH DAILY.   amLODipine (NORVASC) 10 MG tablet TAKE 1 TABLET (10 MG TOTAL) BY MOUTH DAILY.   aspirin EC 81 MG tablet Take 1 tablet (81 mg total) by mouth daily. Swallow whole.   carvedilol (COREG) 6.25 MG tablet Take 1 tablet  (6.25 mg total) by mouth 2 (two) times daily.   cephALEXin (KEFLEX) 500 MG capsule Take 1 capsule (500 mg total) by mouth 2 (two) times daily for 7 days.   isosorbide mononitrate (IMDUR) 30 MG 24 hr tablet Take 1 tablet (30 mg total) by mouth daily.   nitroGLYCERIN (NITROSTAT) 0.4 MG SL tablet Place 1 tablet (0.4 mg total) under the tongue every 5 (five) minutes as needed for chest pain.   omeprazole (PRILOSEC) 20 MG capsule TAKE 1 CAPSULE (20 MG TOTAL) BY MOUTH DAILY.   rosuvastatin (CRESTOR) 40 MG tablet Take 1 tablet (40 mg total) by mouth daily.   ticagrelor (BRILINTA) 90 MG TABS tablet Take 1 tablet (90 mg total) by mouth 2 (two) times daily.   No facility-administered encounter medications on file as of 12/19/2021.     Review of Systems  Review of Systems  Constitutional: Negative.   HENT: Negative.    Cardiovascular: Negative.   Gastrointestinal: Negative.   Allergic/Immunologic: Negative.   Neurological: Negative.   Psychiatric/Behavioral: Negative.         Physical Exam  BP 128/76   Pulse 85   Ht 5\' 9"  (1.753 m)   Wt 256 lb (116.1 kg)   SpO2 95%   BMI 37.80 kg/m   Wt Readings from Last 5 Encounters:  12/19/21 256 lb (116.1 kg)  12/12/21 261 lb 12.8 oz (118.8 kg)  12/06/21 258 lb 1.6 oz (117.1 kg)  11/29/21 262 lb 3.2 oz (118.9 kg)  09/30/21 265 lb (120.2 kg)     Physical Exam Vitals and nursing note reviewed.  Constitutional:      General: He is not in acute distress.    Appearance: He is well-developed.  Cardiovascular:     Rate and Rhythm: Normal rate and regular rhythm.  Pulmonary:     Effort: Pulmonary effort is normal.     Breath sounds: Normal breath sounds.  Skin:    General: Skin is warm and dry.  Neurological:     Mental Status: He is alert and oriented to person, place, and time.      Lab Results:  CBC    Component Value Date/Time   WBC 10.1 12/12/2021 1435   WBC 11.1 (H) 12/07/2021 0150   RBC 3.83 (L) 12/12/2021 1435   RBC  3.74 (L) 12/07/2021 0150   HGB 11.0 (L) 12/12/2021 1435   HCT 31.6 (L) 12/12/2021 1435   PLT 285 12/12/2021 1435   MCV 83 12/12/2021 1435   MCH 28.7 12/12/2021 1435   MCH 28.3 12/07/2021 0150   MCHC 34.8 12/12/2021 1435   MCHC 34.8 12/07/2021 0150   RDW 14.0 12/12/2021 1435   LYMPHSABS 3.0 08/13/2019 0936   MONOABS 0.7 09/04/2015 1859   EOSABS 0.3 08/13/2019 0936   BASOSABS 0.1 08/13/2019 0936    BMET    Component Value Date/Time   NA 140 12/12/2021 1435   K 4.7 12/12/2021 1435   CL 104 12/12/2021 1435   CO2 24 12/12/2021 1435   GLUCOSE 83 12/12/2021  1435   GLUCOSE 86 12/07/2021 0150   BUN 11 12/12/2021 1435   CREATININE 0.85 12/12/2021 1435   CREATININE 0.66 11/20/2016 1051   CALCIUM 9.2 12/12/2021 1435   GFRNONAA >60 12/07/2021 0150   GFRNONAA 117 11/20/2016 1051   GFRAA 133 11/12/2019 0925   GFRAA 135 11/20/2016 1051    BNP No results found for: "BNP"  ProBNP No results found for: "PROBNP"  Imaging: CARDIAC CATHETERIZATION  Result Date: 12/06/2021   Prox RCA lesion is 90% stenosed.   Prox RCA to Mid RCA lesion is 20% stenosed.   Mid LM to Dist LM lesion is 15% stenosed.   Prox LAD to Mid LAD lesion is 40% stenosed.   Mid LAD-1 lesion is 75% stenosed.   Mid LAD-2 lesion is 40% stenosed.   Prox Cx lesion is 20% stenosed.   2nd Mrg lesion is 20% stenosed.   A drug-eluting stent was successfully placed.   Post intervention, there is a 0% residual stenosis. Multivessel CAD with severely calcified proximal RCA stenosis of 90% followed by mid 20% stenosis in a dominant RCA which supplies the LV apex. Diffusely calcified proximal LAD stenoses of 40% 75% and 40%. Mild nonobstructive disease in the distal left main and left circumflex vessel. LVEDP 21 mmHg. Difficult but successful PCI to the severely calcified proximal RCA with PTCA, scoring balloon, and intravascular lithotripsy and ultimate insertion of a 3.5 x 20 mm Synergy DES stent postdilated to 3.75 mm with the stenosis  being reduced to 0% and successful lithotripsy with resolution of previous significant calcification. RECOMMENDATION: DAPT for minimum of 12 months probably longer.  Increase medical therapy for concomitant LAD disease.  If patient has recurrent symptomatology will need staged PCI to LAD.     Assessment & Plan:   Adrenal nodule (HCC) - Ambulatory referral to Endocrinology   2. Cellulitis of left lower extremity  - cephALEXin (KEFLEX) 500 MG capsule; Take 1 capsule (500 mg total) by mouth 2 (two) times daily for 7 days.  Dispense: 14 capsule; Refill: 0    Follow up:  Follow up as scheduled     Ivonne Andrew, NP 12/21/2021

## 2021-12-19 NOTE — Patient Instructions (Addendum)
1. Adrenal nodule (HCC)  - Ambulatory referral to Endocrinology   2. Cellulitis of left lower extremity  - cephALEXin (KEFLEX) 500 MG capsule; Take 1 capsule (500 mg total) by mouth 2 (two) times daily for 7 days.  Dispense: 14 capsule; Refill: 0    Follow up:  Follow up as scheduled

## 2021-12-21 ENCOUNTER — Encounter: Payer: Self-pay | Admitting: Nurse Practitioner

## 2021-12-21 ENCOUNTER — Other Ambulatory Visit: Payer: Self-pay

## 2021-12-21 ENCOUNTER — Telehealth: Payer: Self-pay | Admitting: Licensed Clinical Social Worker

## 2021-12-21 DIAGNOSIS — E278 Other specified disorders of adrenal gland: Secondary | ICD-10-CM | POA: Insufficient documentation

## 2021-12-21 NOTE — Assessment & Plan Note (Signed)
-   Ambulatory referral to Endocrinology   2. Cellulitis of left lower extremity  - cephALEXin (KEFLEX) 500 MG capsule; Take 1 capsule (500 mg total) by mouth 2 (two) times daily for 7 days.  Dispense: 14 capsule; Refill: 0    Follow up:  Follow up as scheduled

## 2021-12-21 NOTE — Telephone Encounter (Signed)
H&V Care Navigation CSW Progress Note  Clinical Social Worker contacted patient by phone to f/u on assistance applications provided. Was able to reach pt this morning at [phone]. Re-introduced self, role, reason for call. Pt shares that he is working on Brilinta PAP and will look at his other paperwork. He was encouraged to bring Brilinta PAP back to [facility] when done so provider can complete their portion. I will f/u to check in on other applications in the next few weeks. Pt encouraged to call me if any questions/concerns in the mean time.   Patient is participating in a Managed Medicaid Plan:  No, self pay.   SDOH Screenings   Food Insecurity: No Food Insecurity (12/12/2021)  Housing: Low Risk  (12/12/2021)  Transportation Needs: No Transportation Needs (12/12/2021)  Utilities: Not At Risk (12/12/2021)  Depression (PHQ2-9): Low Risk  (08/30/2021)  Financial Resource Strain: Medium Risk (12/12/2021)  Tobacco Use: Medium Risk (12/19/2021)    Octavio Graves, MSW, LCSW Clinical Social Worker II [facility] Heart/Vascular Care Navigation  [phone]- work cell phone (preferred) [phone]- desk phone

## 2021-12-25 ENCOUNTER — Other Ambulatory Visit: Payer: Self-pay

## 2021-12-28 ENCOUNTER — Other Ambulatory Visit: Payer: Self-pay

## 2021-12-31 ENCOUNTER — Telehealth: Payer: Self-pay | Admitting: Licensed Clinical Social Worker

## 2021-12-31 NOTE — Telephone Encounter (Signed)
H&V Care Navigation CSW Progress Note  Clinical Social Worker contacted patient by phone to f/u on assistance application for Brilinta. Pt states he completed one with our office, confirmed by Raynelle Fanning, LPN. Pt had actually already completed one with his PCP office and [facility]. They had sent it in and pt was denied. They have requested he be screened by DSS for Medicaid. If ineligible then he needs a denial letter and we can submit that. Pt updated and aware, will try and go by DSS during his day off tomorrow. I will f/u with pt next week if I havent heard back from him by then.   Patient is participating in a Managed Medicaid Plan:  No, self pay only.   SDOH Screenings   Food Insecurity: No Food Insecurity (12/12/2021)  Housing: Low Risk  (12/12/2021)  Transportation Needs: No Transportation Needs (12/12/2021)  Utilities: Not At Risk (12/12/2021)  Depression (PHQ2-9): Low Risk  (08/30/2021)  Financial Resource Strain: Medium Risk (12/12/2021)  Tobacco Use: Medium Risk (12/21/2021)   Octavio Graves, MSW, LCSW Clinical Social Worker II [facility] Heart/Vascular Care Navigation  [phone]- work cell phone (preferred) [phone]- desk phone

## 2022-01-04 ENCOUNTER — Other Ambulatory Visit: Payer: Self-pay

## 2022-01-07 ENCOUNTER — Encounter: Payer: Self-pay | Admitting: Cardiovascular Disease

## 2022-01-07 ENCOUNTER — Ambulatory Visit: Payer: Self-pay | Attending: Cardiovascular Disease | Admitting: Cardiovascular Disease

## 2022-01-07 VITALS — BP 122/70 | HR 86 | Ht 69.0 in | Wt 255.0 lb

## 2022-01-07 DIAGNOSIS — Z6839 Body mass index (BMI) 39.0-39.9, adult: Secondary | ICD-10-CM

## 2022-01-07 DIAGNOSIS — E278 Other specified disorders of adrenal gland: Secondary | ICD-10-CM

## 2022-01-07 DIAGNOSIS — E785 Hyperlipidemia, unspecified: Secondary | ICD-10-CM

## 2022-01-07 DIAGNOSIS — I25119 Atherosclerotic heart disease of native coronary artery with unspecified angina pectoris: Secondary | ICD-10-CM

## 2022-01-07 DIAGNOSIS — G4733 Obstructive sleep apnea (adult) (pediatric): Secondary | ICD-10-CM

## 2022-01-07 DIAGNOSIS — I10 Essential (primary) hypertension: Secondary | ICD-10-CM

## 2022-01-07 DIAGNOSIS — Z79899 Other long term (current) drug therapy: Secondary | ICD-10-CM

## 2022-01-07 DIAGNOSIS — I2510 Atherosclerotic heart disease of native coronary artery without angina pectoris: Secondary | ICD-10-CM

## 2022-01-07 MED ORDER — EZETIMIBE 10 MG PO TABS
10.0000 mg | ORAL_TABLET | Freq: Every day | ORAL | 3 refills | Status: DC
  Filled 2022-01-07: qty 30, 30d supply, fill #0

## 2022-01-07 MED ORDER — CARVEDILOL 12.5 MG PO TABS
12.5000 mg | ORAL_TABLET | Freq: Two times a day (BID) | ORAL | 3 refills | Status: DC
  Filled 2022-01-07: qty 60, 30d supply, fill #0
  Filled 2022-02-08: qty 60, 30d supply, fill #1

## 2022-01-07 NOTE — Patient Instructions (Signed)
Medication Instructions:  INCREASE Carvedilol (Coreg) to 12.5 mg 2 times a day  START Zetia 10 mg daily   *If you need a refill on your cardiac medications before your next appointment, please call your pharmacy*  Lab Work: Your physician recommends that you return for lab work in 2-3 months:  CMP Fasting Lipid Panel- DO NOT eat or drink past midnight. Okay to have water the morning  If you have labs (blood work) drawn today and your tests are completely normal, you will receive your results only by: MyChart Message (if you have MyChart) OR A paper copy in the mail If you have any lab test that is abnormal or we need to change your treatment, we will call you to review the results.  Testing/Procedures: NONE ordered at this time of appointment   Follow-Up: At [facility], you and your health needs are our priority.  As part of our continuing mission to provide you with exceptional heart care, we have created designated Provider Care Teams.  These Care Teams include your primary Cardiologist (physician) and Advanced Practice Providers (APPs -  Physician Assistants and Nurse Practitioners) who all work together to provide you with the care you need, when you need it.  We recommend signing up for the patient portal called "MyChart".  Sign up information is provided on this After Visit Summary.  MyChart is used to connect with patients for Virtual Visits (Telemedicine).  Patients are able to view lab/test results, encounter notes, upcoming appointments, etc.  Non-urgent messages can be sent to your provider as well.   To learn more about what you can do with MyChart, go to [email or web address].    Your next appointment:   3 month(s)  The format for your next appointment:   In Person  Provider:   Nicki Guadalajara, MD     Other Instructions  Important Information About Sugar

## 2022-01-07 NOTE — Progress Notes (Signed)
Cardiology Office Note    Date:  01/14/2022   ID:  Gene Reed, DOB [birth year], MRN [id number]  PCP:  Fenton Foy, NP  Cardiologist:  Shelva Majestic, MD   4 month F/U cardiology/sleep evaluation initially referred through Lazaro Arms, NP   History of Present Illness:  Gene Reed is a 51 y.o. male who has a history of hypertension for at least 6 years, GERD, and recently has experienced some intermittent chest pain.  He was recently evaluated by Lazaro Arms, NP on August 30, 2021 and referred for cardiology consultation on September 06, 2021.  He now presents for follow-up cardiology evaluation after undergoing cardiac catheterization with intravascular lithotripsy to his RCA on December 06, 2021 and sleep compliance evaluation after initiating CPAP therapy.  Gene Reed has a history of obesity, hypertension, and has experienced chest discomfort in the past.  In July 2021 he was evaluated at [facility] and had undergone a stress echo which was normal.  Recently, he has experienced some some musculoskeletal chest cramping discomfort.  Oftentimes he takes a deep breath when he gets this chest wall cramp and it gradually subsides.  He also has experienced some dyspeptic symptoms and at times feeling that his throat is closing.  He denies any exertional chest pain symptomatology or arm radiation.  He denies significant exertional dyspnea or palpitations.  He has been on omeprazole on a daily basis.  He admits to poor sleep with snoring, frequent awakenings, not sure he at least 2-3 times per night and often has to take daytime naps.  Sometimes he has to vomit in the morning to relieve some of his chest pressure.  He cannot sleep on his back.  He has been on amlodipine 10 mg for hypertension, rosuvastatin 10 mg for hyperlipidemia with LDL cholesterol on April 06, 2021 at 97.  Triglycerides were elevated at 245 consistent with a mixed hyperlipidemic pattern.  He is on allopurinol and colchicine with  history of gout.   When I initially saw him, I recommended he undergo a 2D echo Doppler study which was done on September 28, 2021 and showed hyperdynamic LV function with EF 65 to 70%, mild mitral vegetation and aortic valve sclerosis.  A coronary calcium score was obtained which was significantly elevated at 1803 representing 99th percentile. He subsequently underwent coronary CTA on November 20, 2020 which showed severe calcified plaque in the RCA, moderate calcified plaque in the LAD and left circumflex vessel.  CT over read showed stable right lower lobe scarring and a 3 cm left adrenal nodule.  On December 06, 2021 he underwent cardiac catheterization by me and was found to have multivessel CAD with severely calcified proximal RCA stenosis of 90% followed by mid 20% stenosis in the dominant RCA which supplies the LV apex.  There was diffusely calcified proximal LAD stenoses of 4075 and 40%.  There was mild nonobstructive disease in the distal left main and left circumflex vessel.  He underwent difficult but successful PCI to his severely calcified proximal RCA treated with PTCA, scoring balloon, and intravascular lithotripsy with ultimate insertion of a 3.5 x 20 mm Synergy DES stent postdilated to 3.75 mm with the stenosis being reduced to 0% and successful lithotripsy with resolution of previous significant calcification.  He was evaluated by Sande Rives, PA-C on December 12, 2021 and was doing well from a cardiac standpoint without recurrent anginal type symptoms.  He had experienced occasional atypical brief chest pain associated  with positional body twisting.  Subsequently, he has done well and denies any significant recurrent chest pain symptomatology.  He also had undergone a sleep study on September 30, 2021 and met split-night criteria and was found to have severe sleep apnea with an AHI of 52/h, RDI 63.6/h and very severe sleep apnea during REM sleep with an AHI of 84.3/h.  CPAP was titrated up to  optimal 18 cm water pressure.  He received a new ResMed AirSense 10 AutoSet unit on October 12, 2021.  A download was obtained from October 18 through December 20, 2021.  He has been compliant with therapy with usage at 90 days and average use of 5 hours and 41 minutes.  At a pressure range of 14 to 20 cm, AHI is excellent at 1.2 with his 95th percentile pressure at 15.1 and maximum average pressure at 16.0.  He typically goes to bed between 9: 30 and 10 PM and wakes up at 6 AM.  He presents for follow-up Cardiologic and sleep evaluation.  Past Medical History:  Diagnosis Date   Adrenal nodule (HCC)    Arthritis    Bilateral chronic knee pain    Bone spur    CAD (coronary artery disease)    Gout    Hyperlipidemia    Hypertension     Past Surgical History:  Procedure Laterality Date   CORONARY LITHOTRIPSY N/A 12/06/2021   Procedure: CORONARY LITHOTRIPSY;  Surgeon: Troy Sine, MD;  Location: [place] [facility];  Service: Cardiovascular;  Laterality: N/A;   CORONARY STENT INTERVENTION Right 12/06/2021   Procedure: CORONARY STENT INTERVENTION;  Surgeon: Troy Sine, MD;  Location: [place] [facility];  Service: Cardiovascular;  Laterality: Right;   KNEE SURGERY     LEFT HEART CATH AND CORONARY ANGIOGRAPHY N/A 12/06/2021   Procedure: LEFT HEART CATH AND CORONARY ANGIOGRAPHY;  Surgeon: Troy Sine, MD;  Location: [place] [facility];  Service: Cardiovascular;  Laterality: N/A;    Current Medications: Outpatient Medications Prior to Visit  Medication Sig Dispense Refill   allopurinol (ZYLOPRIM) 100 MG tablet TAKE 1 TABLET (100 MG TOTAL) BY MOUTH DAILY. 90 tablet 3   amLODipine (NORVASC) 10 MG tablet TAKE 1 TABLET (10 MG TOTAL) BY MOUTH DAILY. 90 tablet 3   aspirin EC 81 MG tablet Take 1 tablet (81 mg total) by mouth daily. Swallow whole. 90 tablet 3   isosorbide mononitrate (IMDUR) 30 MG 24 hr tablet Take 1 tablet (30 mg total) by mouth daily. 30 tablet 5   nitroGLYCERIN  (NITROSTAT) 0.4 MG SL tablet Place 1 tablet (0.4 mg total) under the tongue every 5 (five) minutes as needed for chest pain. 25 tablet 3   omeprazole (PRILOSEC) 20 MG capsule TAKE 1 CAPSULE (20 MG TOTAL) BY MOUTH DAILY. 90 capsule 3   rosuvastatin (CRESTOR) 40 MG tablet Take 1 tablet (40 mg total) by mouth daily. 30 tablet 5   ticagrelor (BRILINTA) 90 MG TABS tablet Take 1 tablet (90 mg total) by mouth 2 (two) times daily. 60 tablet 11   carvedilol (COREG) 6.25 MG tablet Take 1 tablet (6.25 mg total) by mouth 2 (two) times daily. 180 tablet 3   No facility-administered medications prior to visit.     Allergies:   Poison ivy extract   Social History   Socioeconomic History   Marital status: Widowed    Spouse name: Not on file   Number of children: Not on file   Years of education: Not on  file   Highest education level: Not on file  Occupational History   Not on file  Tobacco Use   Smoking status: Former   Smokeless tobacco: Never  Vaping Use   Vaping Use: Never used  Substance and Sexual Activity   Alcohol use: Yes    Comment:  6 beers per a day    Drug use: Yes    Types: Marijuana    Comment: 1 joint a day    Sexual activity: Not Currently  Other Topics Concern   Not on file  Social History Narrative   Not on file   Social Determinants of Health   Financial Resource Strain: Medium Risk (12/12/2021)   Overall Financial Resource Strain (CARDIA)    Difficulty of Paying Living Expenses: Somewhat hard  Food Insecurity: No Food Insecurity (12/12/2021)   Hunger Vital Sign    Worried About Running Out of Food in the Last Year: Never true    Ran Out of Food in the Last Year: Never true  Transportation Needs: No Transportation Needs (12/12/2021)   PRAPARE - Hydrologist (Medical): No    Lack of Transportation (Non-Medical): No  Physical Activity: Not on file  Stress: Not on file  Social Connections: Not on file    He was born in [place].   He is widowed for 3 years.  He has 1 child and 1 grandchild.  He works at [employer].  He uses cannabis on a daily basis and drinks approximately 8 beers per night.  Family History: He is adopted.  He does not know anything about his father.  He found out about his mother who died at age 61 with stomach cancer.  He does not keep in touch with siblings.  ROS General: Negative; No fevers, chills, or night sweats; obesity HEENT: Negative; No changes in vision or hearing, sinus congestion, difficulty swallowing Pulmonary: Negative; No cough, wheezing, shortness of breath, hemoptysis Cardiovascular: See HPI GI: Dyspeptic symptoms GU: Negative; No dysuria, hematuria, or difficulty voiding Musculoskeletal: Negative; no myalgias, joint pain, or weakness Hematologic/Oncology: Negative; no easy bruising, bleeding Endocrine: Negative; no heat/cold intolerance; no diabetes Neuro: Negative; no changes in balance, headaches Skin: Negative; No rashes or skin lesions Psychiatric: Negative; No behavioral problems, depression Sleep: Poor sleep, cannot sleep on his back, snoring, frequent awakenings, nocturia, daytime naps;  Now on CPAP therapy with set up date October 12, 2021. no bruxism, restless legs, hypnogognic hallucinations, no cataplexy  An Epworth Sleepiness Scale score was calculated in the office on September 06, 2021 endorsed at 13 consistent with daytime sleepiness as shown below:   Epworth Sleepiness Scale: Situation   Chance of Dozing/Sleeping (0 = never , 1 = slight chance , 2 = moderate chance , 3 = high chance )   sitting and reading 3   watching TV 2   sitting inactive in a public place 2   being a passenger in a motor vehicle for an hour or more 3   lying down in the afternoon 3   sitting and talking to someone 0   sitting quietly after lunch (no alcohol) 0   while stopped for a few minutes in traffic as the driver 0   Total Score  13    Other comprehensive 14 point system review  is negative.   PHYSICAL EXAM:   VS:  BP 122/70 (BP Location: Left Arm, Patient Position: Sitting, Cuff Size: Large)   Pulse 86   Ht 5'  9" (1.753 m)   Wt 255 lb (115.7 kg)   BMI 37.66 kg/m     Repeat blood pressure by me was 132/78  Wt Readings from Last 3 Encounters:  01/07/22 255 lb (115.7 kg)  12/19/21 256 lb (116.1 kg)  12/12/21 261 lb 12.8 oz (118.8 kg)    General: Alert, oriented, no distress.  Skin: normal turgor, no rashes, warm and dry HEENT: Normocephalic, atraumatic. Pupils equal round and reactive to light; sclera anicteric; extraocular muscles intact;  Nose without nasal septal hypertrophy Mouth/Parynx benign; Mallinpatti scale 4 Neck: No JVD, no carotid bruits; normal carotid upstroke Lungs: clear to ausculatation and percussion; no wheezing or rales Chest wall: without tenderness to palpitation Heart: PMI not displaced, RRR, s1 s2 normal, 1/6 systolic murmur, no diastolic murmur, no rubs, gallops, thrills, or heaves Abdomen: Central adiposity; soft, nontender; no hepatosplenomehaly, BS+; abdominal aorta nontender and not dilated by palpation. Back: no CVA tenderness Pulses 2+ Musculoskeletal: full range of motion, normal strength, no joint deformities Extremities: no clubbing cyanosis or edema, Homan's sign negative  Neurologic: grossly nonfocal; Cranial nerves grossly wnl Psychologic: Normal mood and affect   Studies/Labs Reviewed:   January 07, 2022 ECG (independently read by me): NSR at 86, RBBB with repolarization  September 06, 2021 ECG (independently read by me): NSR at 86, RBBB  Recent Labs:    Latest Ref Rng & Units 12/12/2021    2:35 PM 12/07/2021    1:50 AM 11/29/2021    4:12 PM  BMP  Glucose 70 - 99 mg/dL 83  86  82   BUN 6 - 24 mg/dL _0 Creatinine 0.76 - 1.27 mg/dL 0.85  0.75  0.78   BUN/Creat Ratio 9 - _1 Sodium 134 - 144 mmol/L 140  136  140   Potassium 3.5 - 5.2 mmol/L 4.7  3.1  4.2   Chloride 96 - 106 mmol/L 104  106   102   CO2 20 - 29 mmol/L _2 Calcium 8.7 - 10.2 mg/dL 9.2  8.5  8.9         Latest Ref Rng & Units 08/30/2021    1:54 PM 04/06/2021   10:58 AM 01/05/2021   12:20 PM  Hepatic Function  Total Protein 6.0 - 8.5 g/dL 8.0  8.0  8.1   Albumin 4.1 - 5.1 g/dL 4.7  4.6  4.7   AST 0 - 40 IU/L 66  56  65   ALT 0 - 44 IU/L 41     Alk Phosphatase 44 - 121 IU/L 151  156  164   Total Bilirubin 0.0 - 1.2 mg/dL 0.5  0.4  0.4        Latest Ref Rng & Units 12/12/2021    2:35 PM 12/07/2021    1:50 AM 11/29/2021    4:12 PM  CBC  WBC 3.4 - 10.8 x10E3/uL 10.1  11.1  9.8   Hemoglobin 13.0 - 17.7 g/dL 11.0  10.6  12.2   Hematocrit 37.5 - 51.0 % 31.6  30.5  36.4   Platelets 150 - 450 x10E3/uL 285  219  297    Lab Results  Component Value Date   MCV 83 12/12/2021   MCV 81.6 12/07/2021   MCV 85 11/29/2021   Lab Results  Component Value Date   TSH 2.570 04/06/2021   Lab Results  Component Value Date   HGBA1C 4.0 12/16/2018  HGBA1C 4.9 12/16/2018     BNP No results found for: "BNP"  ProBNP No results found for: "PROBNP"   Lipid Panel     Component Value Date/Time   CHOL 181 04/06/2021 1058   TRIG 245 (H) 04/06/2021 1058   HDL 42 04/06/2021 1058   CHOLHDL 4.3 04/06/2021 1058   LDLCALC 97 04/06/2021 1058   LABVLDL 42 (H) 04/06/2021 1058     RADIOLOGY: No results found.   Additional studies/ records that were reviewed today include:  I reviewed the records of [facility] cardiology from August 26, 2019 and September 10, 2019.  Records of Lazaro Arms, NP were reviewed.   ASSESSMENT:    1. Coronary artery disease involving native coronary artery of native heart with angina pectoris ([place])   2. Primary hypertension   3. OSA (obstructive sleep apnea)   4. Hyperlipidemia with target LDL less than 55   5. Adrenal nodule (HCC)   6. Class 2 severe obesity due to excess calories with serious comorbidity and body mass index (BMI) of 37.0 to 37.9 in adult ([place])   7. Medication  management     PLAN:  Gene Reed is a 51 year old gentleman who has a history of morbid obesity, hypertension for at least 6 years, GERD, and had developed chest pain.  Cardiovascular workup for chest pain he underwent 2D echo Doppler, coronary calcium score, and CTA.  He was found to have a severely elevated calcium score at 1902 representing 99th percentile for age.  Definitive cardiac catheterization revealed multivessel CAD with high-grade calcified stenosis in the proximal RCA and a very dominant vessel as well as diffusely calcified proximal LAD stenoses of 40, 75 and 40% and nonobstructive disease in the distal left main and left circumflex.  He underwent difficult but successful PCI to his severely calcified proximal 90% RCA stenosis which we required PTCA, scoring balloon, and ultimate successful shockwave therapy for intravascular lithotripsy and insertion of a 3.5 x 20 mm Synergy DES stent postdilated to 3.75 mm.  The stenosis was reduced to 0%.  Subsequently he has done well and is on medical therapy for his concomitant CAD.  Due to my concerns for significant obstructive sleep apnea on his initial evaluation with me, he underwent a sleep study and was found to have very obstructive sleep apnea with an overall AHI of 52.0, and rem sleep AHI at 84.3/h.  He initiated CPAP therapy set up date on October 12, 2021 and has been demonstrated to be compliant at his initial download from October 18 through December 20, 2021 demonstrated 90% of usage days with average use at 5 hours and 41 minutes.  Typically goes to bed between 930 and 10 and wakes up at 6.  AHI was excellent at 1.2 and his CPAP is set at a pressure range of 14-20 with his 95th percentile pressure at 15.1 and maximum average pressure at 6.0.  At his initial evaluation I had a lengthy discussion with him and discussed potential adverse consequences of untreated sleep apnea including its effects on hypertension, nocturnal arrhythmias,  increased risk for atrial fibrillation, negative effects on insulin resistance, increased inflammatory markers, GERD, potential for internal hypoxemia contributing to significant coronary as well as cerebrovascular ischemia.  I discussed optimal sleep duration for someone his age and the 7 and 9 hours per night range and he should use CPAP for the nights entirety particularly since the preponderance of REM sleep occurs in the second half of the night.  He  is sleeping much better since initiating CPAP therapy.  His blood pressure today is stable.  However with his resting pulse at 86 bpm and concomitant CAD I am recommending further titration of carvedilol to 12.5 mg twice a day.  He will continue amlodipine 10 mg, isosorbide 30 mg daily.  He is on DAPT with aspirin/Brilinta.  I have recommended aggressive lipid-lowering therapy and will add Zetia 10 mg to his current dose of rosuvastatin 40 mg with target LDL less than 55.  I will recheck laboratory with a comprehensive metabolic panel, CBC, and lipid studies in several months I will see him in 3 months for follow-up evaluation.   Medication Adjustments/Labs and Tests Ordered: Current medicines are reviewed at length with the patient today.  Concerns regarding medicines are outlined above.  Medication changes, Labs and Tests ordered today are listed in the Patient Instructions below. Patient Instructions  Medication Instructions:  INCREASE Carvedilol (Coreg) to 12.5 mg 2 times a day  START Zetia 10 mg daily   *If you need a refill on your cardiac medications before your next appointment, please call your pharmacy*  Lab Work: Your physician recommends that you return for lab work in 2-3 months:  CMP Fasting Lipid Panel- DO NOT eat or drink past midnight. Okay to have water the morning  If you have labs (blood work) drawn today and your tests are completely normal, you will receive your results only by: [place] (if you have MyChart) OR A  paper copy in the mail If you have any lab test that is abnormal or we need to change your treatment, we will call you to review the results.  Testing/Procedures: NONE ordered at this time of appointment   Follow-Up: At [facility], you and your health needs are our priority.  As part of our continuing mission to provide you with exceptional heart care, we have created designated Provider Care Teams.  These Care Teams include your primary Cardiologist (physician) and Advanced Practice Providers (APPs -  Physician Assistants and Nurse Practitioners) who all work together to provide you with the care you need, when you need it.  We recommend signing up for the patient portal called "MyChart".  Sign up information is provided on this After Visit Summary.  MyChart is used to connect with patients for Virtual Visits (Telemedicine).  Patients are able to view lab/test results, encounter notes, upcoming appointments, etc.  Non-urgent messages can be sent to your provider as well.   To learn more about what you can do with MyChart, go to [email or web address].    Your next appointment:   3 month(s)  The format for your next appointment:   In Person  Provider:   Shelva Majestic, MD     Other Instructions  Important Information About Sugar         Signed, Shelva Majestic, MD  01/14/2022 6:22 PM    [facility] [address], [place], [place], [place]  [address] Phone: [phone]

## 2022-01-08 ENCOUNTER — Telehealth: Payer: Self-pay | Admitting: Licensed Clinical Social Worker

## 2022-01-08 ENCOUNTER — Other Ambulatory Visit: Payer: Self-pay

## 2022-01-08 NOTE — Telephone Encounter (Signed)
H&V Care Navigation CSW Progress Note  Clinical Social Worker contacted patient by phone to f/u on denial letter for Medicaid. Pt confirms he has brought the letter to [facility] pharmacy. I was able to note that they have received it and submitted it to PAP today. I will monitor if any updates. Pt appreciative of f/u. No additional questions at this time.   Patient is participating in a Managed Medicaid Plan:  No, self pay only.   SDOH Screenings   Food Insecurity: No Food Insecurity (12/12/2021)  Housing: Low Risk  (12/12/2021)  Transportation Needs: No Transportation Needs (12/12/2021)  Utilities: Not At Risk (12/12/2021)  Depression (PHQ2-9): Low Risk  (08/30/2021)  Financial Resource Strain: Medium Risk (12/12/2021)  Tobacco Use: Medium Risk (01/07/2022)     Gene Reed, MSW, LCSW Clinical Social Worker II [facility] Health Heart/Vascular Care Navigation  [phone]- work cell phone (preferred) [phone]- desk phone

## 2022-01-09 ENCOUNTER — Other Ambulatory Visit: Payer: Self-pay

## 2022-01-11 ENCOUNTER — Other Ambulatory Visit: Payer: Self-pay

## 2022-01-14 ENCOUNTER — Encounter: Payer: Self-pay | Admitting: Cardiovascular Disease

## 2022-01-14 ENCOUNTER — Other Ambulatory Visit: Payer: Self-pay

## 2022-01-15 ENCOUNTER — Telehealth: Payer: Self-pay | Admitting: Licensed Clinical Social Worker

## 2022-01-15 NOTE — Telephone Encounter (Signed)
H&V Care Navigation CSW Progress Note  Clinical Social Worker notes that pt approved for [place] and [place] for [facility] [phone]) start date of 01/14/22-01/15/23.   Patient is participating in a Managed Medicaid Plan:  No, self pay only. Provided w. Assistance applications  SDOH Screenings   Food Insecurity: No Food Insecurity (12/12/2021)  Housing: Low Risk  (12/12/2021)  Transportation Needs: No Transportation Needs (12/12/2021)  Utilities: Not At Risk (12/12/2021)  Depression (PHQ2-9): Low Risk  (08/30/2021)  Financial Resource Strain: Medium Risk (12/12/2021)  Tobacco Use: Medium Risk (01/14/2022)   Octavio Graves, MSW, LCSW Clinical Social Worker II [facility] Heart/Vascular Care Navigation  [phone]- work cell phone (preferred) [phone]- desk phone

## 2022-01-17 ENCOUNTER — Telehealth: Payer: Self-pay | Admitting: Cardiovascular Disease

## 2022-01-17 NOTE — Telephone Encounter (Signed)
  Pt c/o Syncope: STAT if syncope occurred within 30 minutes and pt complains of lightheadedness High Priority if episode of passing out, completely, today or in last 24 hours   Did you pass out today?   Yesterday (12/13) when getting hair cut at barber shop   When is the last time you passed out?   Yesterday   Has this occurred multiple times?   Yes   Did you have any symptoms prior to passing out?   Unsure    Caller stated patient had an episode of syncope yesterday.  Patient recently had stent placed. Appointment scheduled 12/18 at 8:50 am.

## 2022-01-17 NOTE — Telephone Encounter (Signed)
Spoke with patient of Gene Reed who passed out yesterday while Gene Reed was at the barber shop. Gene Reed was seated. Gene Reed felt lightheaded, progressed, and then the lost consciousness. Gene Reed was diaphoretic. EMS was not called. Gene Reed said Gene Reed was told Gene Reed was jerky, choking, trying to breathe. Gene Reed was delusional when Gene Reed came around. Gene Reed drank water after episode. This is the first time this has happened that Gene Reed fully blacked out/lost consciousness.  Discussed with DOD, Gene Reed - advised Gene Reed should keep visit with Gene Reed on 12/18. Call back if recurrent/worsening symptoms   BP has been around 130/80

## 2022-01-21 ENCOUNTER — Ambulatory Visit: Payer: Medicaid Other | Attending: Cardiovascular Disease | Admitting: Physician Assistant

## 2022-01-21 ENCOUNTER — Other Ambulatory Visit (INDEPENDENT_AMBULATORY_CARE_PROVIDER_SITE_OTHER): Payer: Medicaid Other

## 2022-01-21 ENCOUNTER — Encounter: Payer: Self-pay | Admitting: Physician Assistant

## 2022-01-21 VITALS — BP 99/59 | HR 80 | Ht 69.0 in | Wt 255.6 lb

## 2022-01-21 DIAGNOSIS — I2510 Atherosclerotic heart disease of native coronary artery without angina pectoris: Secondary | ICD-10-CM

## 2022-01-21 DIAGNOSIS — S6990XS Unspecified injury of unspecified wrist, hand and finger(s), sequela: Secondary | ICD-10-CM

## 2022-01-21 DIAGNOSIS — R55 Syncope and collapse: Secondary | ICD-10-CM

## 2022-01-21 DIAGNOSIS — E785 Hyperlipidemia, unspecified: Secondary | ICD-10-CM

## 2022-01-21 DIAGNOSIS — E278 Other specified disorders of adrenal gland: Secondary | ICD-10-CM

## 2022-01-21 DIAGNOSIS — I10 Essential (primary) hypertension: Secondary | ICD-10-CM

## 2022-01-21 NOTE — Progress Notes (Unsigned)
Enrolled for [employer] to mail a ZIO AT Live Telemetry monitor to patients address on file.   Dr. Kelly to read. 

## 2022-01-21 NOTE — Patient Instructions (Addendum)
Medication Instructions:  Stop Imdur. Stop Amlodipine. *If you need a refill on your cardiac medications before your next appointment, please call your pharmacy*   Lab Work: BMET, CBC, TSH  Today If you have labs (blood work) drawn today and your tests are completely normal, you will receive your results only by: [name] (if you have MyChart) OR A paper copy in the mail If you have any lab test that is abnormal or we need to change your treatment, we will call you to review the results.   Testing/Procedures: ZIO AT Long term monitor-Live Telemetry  Your physician has requested you wear a ZIO patch monitor for 14 days.  This is a single patch monitor. [employer] supplies one patch monitor per enrollment. Additional  stickers are not available.  Please do not apply patch if you will be having a Nuclear Stress Test, Echocardiogram, Cardiac CT, MRI,  or Chest Xray during the period you would be wearing the monitor. The patch cannot be worn during  these tests. You cannot remove and re-apply the ZIO AT patch monitor.  Your ZIO patch monitor will be mailed 3 day USPS to your address on file. It may take 3-5 days to  receive your monitor after you have been enrolled.  Once you have received your monitor, please review the enclosed instructions. Your monitor has  already been registered assigning a specific monitor serial # to you.   Billing and Patient Assistance Program information  Gene Reed has been supplied with any insurance information on record for billing. [employer] offers a sliding scale Patient Assistance Program for patients without insurance, or whose  insurance does not completely cover the cost of the ZIO patch monitor. You must apply for the  Patient Assistance Program to qualify for the discounted rate. To apply, call [employer] at [phone],  select option 4, select option 2 , ask to apply for the Patient Assistance Program, (you can request an  interpreter if needed).  [employer] will ask your household income and how many people are in your  household. [employer] will quote your out-of-pocket cost based on this information. They will also be able  to set up a 12 month interest free payment plan if needed.  Applying the monitor   Shave hair from upper left chest.  Hold the abrader disc by orange tab. Rub the abrader in 40 strokes over left upper chest as indicated in  your monitor instructions.  Clean area with 4 enclosed alcohol pads. Use all pads to ensure the area is cleaned thoroughly. Let  dry.  Apply patch as indicated in monitor instructions. Patch will be placed under collarbone on left side of  chest with arrow pointing upward.  Rub patch adhesive wings for 2 minutes. Remove the white label marked "1". Remove the white label  marked "2". Rub patch adhesive wings for 2 additional minutes.  While looking in a mirror, press and release button in center of patch. A small green light will flash 3-4  times. This will be your only indicator that the monitor has been turned on.  Do not shower for the first 24 hours. You may shower after the first 24 hours.  Press the button if you feel a symptom. You will hear a small click. Record Date, Time and Symptom in  the Patient Log.   Starting the Gateway  In your kit there is a Hydrographic surveyor box the size of a cellphone. This is Airline pilot. It transmits all your  recorded data  to [employer]. This box must always stay within 10 feet of you. Open the box and push the *  button. There will be a light that blinks orange and then green a few times. When the light stops  blinking, the Gateway is connected to the ZIO patch. Call [employer] at [phone] to confirm your monitor is transmitting.  Returning your monitor  Remove your patch and place it inside the [place]. In the lower half of the Gateway there is a white  bag with prepaid postage on it. Place Gateway in bag and seal. Mail package back to [place] as soon as   possible. Your physician should have your final report approximately 7 days after you have mailed back  your monitor. Call [place] at [phone] if you have questions regarding your ZIO AT  patch monitor. Call them immediately if you see an orange light blinking on your monitor.  If your monitor falls off in less than 4 days, contact our Monitor department at [phone]. If your  monitor becomes loose or falls off after 4 days call [employer] at [phone] for suggestions on  securing your monitor    Follow-Up: At [facility], you and your health needs are our priority.  As part of our continuing mission to provide you with exceptional heart care, we have created designated Provider Care Teams.  These Care Teams include your primary Cardiologist (physician) and Advanced Practice Providers (APPs -  Physician Assistants and Nurse Practitioners) who all work together to provide you with the care you need, when you need it.  We recommend signing up for the patient portal called "MyChart".  Sign up information is provided on this After Visit Summary.  MyChart is used to connect with patients for Virtual Visits (Telemedicine).  Patients are able to view lab/test results, encounter notes, upcoming appointments, etc.  Non-urgent messages can be sent to your provider as well.   To learn more about what you can do with MyChart, go to [email or web address].    Your next appointment:   6 week(s)  The format for your next appointment:   In Person  Provider:   Almyra Deforest, PA-C

## 2022-01-21 NOTE — Progress Notes (Unsigned)
Cardiology Office Note:    Date:  01/22/2022   ID:  Gene Reed, DOB [birth year], MRN [id number]  PCP:  Fenton Foy, NP   [place] Prov[facility] Cardiologist:  Shelva Majestic, MD     Referring MD: Fenton Foy, NP   Chief Complaint  Patient presents with   Follow-up    Seen for Dr. Claiborne Billings   Loss of Consciousness    History of Present Illness:    Gene Reed is a 51 y.o. male with a hx of CAD, HTN, HLD, adrenal nodule noted on CTA 11/2021, GERD, gout, OSA and chronic knee pain.  Patient was initially referred to Dr. Claiborne Billings August 2023 for evaluation of chest pain.  The chest discomfort sound rather atypical and musculoskeletal in nature.  Coronary calcium score and echocardiogram were ordered.  Echocardiogram showed EF 65 to 70%, no regional wall motion abnormality, mild LVH, normal diastolic parameters, mild MR.  Coronary calcium score come back elevated at 1803 which placed the patient in 99th percentile for age and sex matched control.  Coronary CT was ordered which showed possible severe proximal RCA stenosis and moderate LAD and left circumflex stenosis, FFR was unable to be processed.  CTA also revealed incidentally of 3 cm left adrenal nodule for which additional imaging was recommended.  He was seen in the cardiology office and eventually set up for cardiac catheterization on 12/06/2021 which revealed a 15% stenosis in mid to distal left main, 90% stenosis in proximal RCA followed by 20% stenosis in proximal to mid RCA, 40% stenosis in proximal to mid LAD followed by 75% and 40% stenosis in mid LAD, 20% stenosis in proximal left circumflex artery and a 20% OM 2.  He underwent difficult but successful PCI to the severely calcified proximal RCA with PTCA, scoring balloon and ultimately insertion of a drug-eluting stent.  LAD disease was treated medically with plans for staged PCI to LAD if he has recurrent angina.  Postprocedure, he was placed on aspirin and Brilinta as  well as Imdur.  He was supposed to discuss with his PCP regarding the adrenal nodule.  His CPAP machine was managed by Dr. Claiborne Billings.  Patient presents today for evaluation of syncope.  He says he has been having intermittent dizziness at work recently.  He also feels dizzy when he first get up in the morning.  He has been compliant with his medications.  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  Prior to passing out spell, he had flushing sensation and cold sweat.  Although the symptom sounds like a vasovagal episode, his orthostatic vital sign was also quite positive as well, see below.  I also recommended a 2-week ZIO AT monitor, blood work including basic metabolic panel, CBC and TSH.  I gave the patient a work note for no driving for at least 6 weeks until cleared by cardiology service.  He can follow-up in 6 weeks to review heart monitor and reassess blood pressure.   Lying down 99/59, pulse 80 Sitting 99/63 pulse 82 Standing 0 minutes 81/49 pulse 82 Standing 3-minute 68/42 heart rate 70.  Past Medical History:  Diagnosis Date   Adrenal nodule (HCC)    Arthritis    Bilateral chronic knee pain    Bone spur    CAD (coronary artery disease)    Gout    Hyperlipidemia    Hypertension     Past Surgical History:  Procedure Laterality  Date   CORONARY LITHOTRIPSY N/A 12/06/2021   Procedure: CORONARY LITHOTRIPSY;  Surgeon: Troy Sine, MD;  Location: [facility];  Service: Cardiovascular;  Laterality: N/A;   CORONARY STENT INTERVENTION Right 12/06/2021   Procedure: CORONARY STENT INTERVENTION;  Surgeon: Troy Sine, MD;  Location: [facility];  Service: Cardiovascular;  Laterality: Right;   KNEE SURGERY     LEFT HEART CATH AND CORONARY ANGIOGRAPHY N/A 12/06/2021   Procedure: LEFT HEART CATH AND CORONARY ANGIOGRAPHY;  Surgeon: Troy Sine, MD;  Location: [facility];  Service: Cardiovascular;  Laterality: N/A;     Current Medications: Current Meds  Medication Sig   allopurinol (ZYLOPRIM) 100 MG tablet TAKE 1 TABLET (100 MG TOTAL) BY MOUTH DAILY.   aspirin EC 81 MG tablet Take 1 tablet (81 mg total) by mouth daily. Swallow whole.   carvedilol (COREG) 12.5 MG tablet Take 1 tablet (12.5 mg total) by mouth 2 (two) times daily.   ezetimibe (ZETIA) 10 MG tablet Take 1 tablet (10 mg total) by mouth daily.   nitroGLYCERIN (NITROSTAT) 0.4 MG SL tablet Place 1 tablet (0.4 mg total) under the tongue every 5 (five) minutes as needed for chest pain.   omeprazole (PRILOSEC) 20 MG capsule TAKE 1 CAPSULE (20 MG TOTAL) BY MOUTH DAILY.   rosuvastatin (CRESTOR) 40 MG tablet Take 1 tablet (40 mg total) by mouth daily.   ticagrelor (BRILINTA) 90 MG TABS tablet Take 1 tablet (90 mg total) by mouth 2 (two) times daily.   [DISCONTINUED] amLODipine (NORVASC) 10 MG tablet TAKE 1 TABLET (10 MG TOTAL) BY MOUTH DAILY.   [DISCONTINUED] isosorbide mononitrate (IMDUR) 30 MG 24 hr tablet Take 1 tablet (30 mg total) by mouth daily.     Allergies:   Poison ivy extract   Social History   Socioeconomic History   Marital status: Widowed    Spouse name: Not on file   Number of children: Not on file   Years of education: Not on file   Highest education level: Not on file  Occupational History   Not on file  Tobacco Use   Smoking status: Former   Smokeless tobacco: Never  Vaping Use   Vaping Use: Never used  Substance and Sexual Activity   Alcohol use: Yes    Comment:  6 beers per a day    Drug use: Yes    Types: Marijuana    Comment: 1 joint a day    Sexual activity: Not Currently  Other Topics Concern   Not on file  Social History Narrative   Not on file   Social Determinants of Health   Financial Resource Strain: Medium Risk (12/12/2021)   Overall Financial Resource Strain (CARDIA)    Difficulty of Paying Living Expenses: Somewhat hard  Food Insecurity: No Food Insecurity (12/12/2021)   Hunger Vital Sign     Worried About Running Out of Food in the Last Year: Never true    Ran Out of Food in the Last Year: Never true  Transportation Needs: No Transportation Needs (12/12/2021)   PRAPARE - Hydrologist (Medical): No    Lack of Transportation (Non-Medical): No  Physical Activity: Not on file  Stress: Not on file  Social Connections: Not on file     Family History: The patient's family history is not on file.  ROS:   Please see the history of present illness.     All other systems reviewed and are  negative.  EKGs/Labs/Other Studies Reviewed:    The following studies were reviewed today:  Echo 09/28/2021  1. Left ventricular ejection fraction, by estimation, is 65 to 70%. The  left ventricle has normal function. The left ventricle has no regional  wall motion abnormalities. There is mild left ventricular hypertrophy.  Left ventricular diastolic parameters  were normal.   2. Right ventricular systolic function is normal. The right ventricular  size is normal.   3. The mitral valve is normal in structure. Mild mitral valve  regurgitation.   4. The aortic valve is tricuspid. Aortic valve regurgitation is not  visualized. Aortic valve sclerosis/calcification is present, without any  evidence of aortic stenosis.   5. The inferior vena cava is normal in size with greater than 50%  respiratory variability, suggesting right atrial pressure of 3 mmHg.   Cath 12/06/2021   Prox RCA lesion is 90% stenosed.   Prox RCA to Mid RCA lesion is 20% stenosed.   Mid LM to Dist LM lesion is 15% stenosed.   Prox LAD to Mid LAD lesion is 40% stenosed.   Mid LAD-1 lesion is 75% stenosed.   Mid LAD-2 lesion is 40% stenosed.   Prox Cx lesion is 20% stenosed.   2nd Mrg lesion is 20% stenosed.   A drug-eluting stent was successfully placed.   Post intervention, there is a 0% residual stenosis.   Multivessel CAD with severely calcified proximal RCA stenosis of 90% followed by mid  20% stenosis in a dominant RCA which supplies the LV apex.   Diffusely calcified proximal LAD stenoses of 40% 75% and 40%.   Mild nonobstructive disease in the distal left main and left circumflex vessel.   LVEDP 21 mmHg.   Difficult but successful PCI to the severely calcified proximal RCA with PTCA, scoring balloon, and intravascular lithotripsy and ultimate insertion of a 3.5 x 20 mm Synergy DES stent postdilated to 3.75 mm with the stenosis being reduced to 0% and successful lithotripsy with resolution of previous significant calcification.   RECOMMENDATION: DAPT for minimum of 12 months probably longer.  Increase medical therapy for concomitant LAD disease.  If patient has recurrent symptomatology will need staged PCI to LAD.  EKG:  EKG is not ordered today.   Recent Labs: 08/30/2021: ALT 41 01/21/2022: BUN 11; Creatinine, Ser 0.84; Hemoglobin 6.5; Platelets 288; Potassium 4.1; Sodium 135; TSH 2.320  Recent Lipid Panel    Component Value Date/Time   CHOL 181 04/06/2021 1058   TRIG 245 (H) 04/06/2021 1058   HDL 42 04/06/2021 1058   CHOLHDL 4.3 04/06/2021 1058   LDLCALC 97 04/06/2021 1058     Risk Assessment/Calculations:           Physical Exam:    VS:  BP (!) 99/59 (BP Location: Right Arm, Cuff Size: Large)   Pulse 80   Ht _0  (1.753 m)   Wt 255 lb 9.6 oz (115.9 kg)   SpO2 100%   BMI 37.75 kg/m         Wt Readings from Last 3 Encounters:  01/21/22 255 lb 9.6 oz (115.9 kg)  01/07/22 255 lb (115.7 kg)  12/19/21 256 lb (116.1 kg)     GEN:  Well nourished, well developed in no acute distress HEENT: Normal NECK: No JVD; No carotid bruits LYMPHATICS: No lymphadenopathy CARDIAC: RRR, no murmurs, rubs, gallops RESPIRATORY:  Clear to auscultation without rales, wheezing or rhonchi  ABDOMEN: Soft, non-tender, non-distended MUSCULOSKELETAL:  No edema; No deformity  SKIN:  Warm and dry NEUROLOGIC:  Alert and oriented x 3 PSYCHIATRIC:  Normal affect    ASSESSMENT:    1. Syncope and collapse   2. Coronary artery disease involving native coronary artery of native heart without angina pectoris   3. Essential hypertension   4. Hyperlipidemia LDL goal <70   5. Adrenal nodule ([place])   6. 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  Orthostatic vital signs positive today.  Systolic blood pressure was as low as in the 60s upon standing.  I will obtain initial blood work including CBC, basic metabolic panel and a TSH.  I recommended a heart monitor and also discontinuation of amlodipine and Imdur.  CAD: Recently underwent DES to proximal RCA, patient also has a 75% proximal to mid residual that was managed medically.  It was recommended to consider staged PCI if he has recurrent chest discomfort.  Continue aspirin, carvedilol and Brilinta.  I decided to remove Imdur and amlodipine due to orthostatic hypotension  Hypertension: Blood pressure borderline low, vital signs consistent with orthostatic hypotension.  Stop amlodipine and Imdur, encourage increase oral fluid intake  Hyperlipidemia: On Crestor  Adrenal nodule: Previous CTA of the chest revealed 3 cm left adrenal nodule for which additional imaging was recommended.  Based on the previous note on 12/12/2021, patient has opted to discuss with PCP for additional imaging.  Finger injury: He mentioned he has injury to his finger where he had profuse bleeding.  It is covered with dressing at this time.  He says that the nail came off during the injury.  I asked him to review this with his PCP or follow-up in urgent care.           Medication Adjustments/Labs and Tests Ordered: Current medicines are reviewed at length with the patient today.  Concerns regarding medicines are outlined  above.  Orders Placed This Encounter  Procedures   Basic metabolic panel   CBC   TSH   LONG TERM MONITOR-LIVE TELEMETRY (3-14 DAYS)   No orders of the defined types were placed in this encounter.   Patient Instructions  Medication Instructions:  Stop Imdur. Stop Amlodipine. *If you need a refill on your cardiac medications before your next appointment, please call your pharmacy*   Lab Work: BMET, CBC, TSH  Today If you have labs (blood work) drawn today and your tests are completely normal, you will receive your results only by: [place] (if you have MyChart) OR A paper copy in the mail If you have any lab test that is abnormal or we need to change your treatment, we will call you to review the results.   Testing/Procedures: ZIO AT Long term monitor-Live Telemetry  Your physician has requested you wear a ZIO patch monitor for 14 days.  This is a single patch monitor. [employer] supplies one patch monitor per enrollment. Additional  stickers are not available.  Please do not apply patch if you will be having a Nuclear Stress Test, Echocardiogram, Cardiac CT, MRI,  or Chest Xray during the period you would be wearing the monitor. The patch cannot be worn during  these tests. You cannot remove and re-apply the ZIO AT patch monitor.  Your ZIO patch monitor will be mailed 3 day USPS to your address on file.  It may take 3-5 days to  receive your monitor after you have been enrolled.  Once you have received your monitor, please review the enclosed instructions. Your monitor has  already been registered assigning a specific monitor serial # to you.   Billing and Patient Assistance Program information  Theodore Demark has been supplied with any insurance information on record for billing. [employer] offers a sliding scale Patient Assistance Program for patients without insurance, or whose  insurance does not completely cover the cost of the ZIO patch monitor. You must apply for the   Patient Assistance Program to qualify for the discounted rate. To apply, call [employer] at [phone],  select option 4, select option 2 , ask to apply for the Patient Assistance Program, (you can request an  interpreter if needed). [employer] will ask your household income and how many people are in your  household. [employer] will quote your out-of-pocket cost based on this information. They will also be able  to set up a 12 month interest free payment plan if needed.  Applying the monitor   Shave hair from upper left chest.  Hold the abrader disc by orange tab. Rub the abrader in 40 strokes over left upper chest as indicated in  your monitor instructions.  Clean area with 4 enclosed alcohol pads. Use all pads to ensure the area is cleaned thoroughly. Let  dry.  Apply patch as indicated in monitor instructions. Patch will be placed under collarbone on left side of  chest with arrow pointing upward.  Rub patch adhesive wings for 2 minutes. Remove the white label marked "1". Remove the white label  marked "2". Rub patch adhesive wings for 2 additional minutes.  While looking in a mirror, press and release button in center of patch. A small green light will flash 3-4  times. This will be your only indicator that the monitor has been turned on.  Do not shower for the first 24 hours. You may shower after the first 24 hours.  Press the button if you feel a symptom. You will hear a small click. Record Date, Time and Symptom in  the Patient Log.   Starting the Gateway  In your kit there is a Hydrographic surveyor box the size of a cellphone. This is Airline pilot. It transmits all your  recorded data to [facility]. This box must always stay within 10 feet of you. Open the box and push the *  button. There will be a light that blinks orange and then green a few times. When the light stops  blinking, the Gateway is connected to the ZIO patch. Call [employer] at [phone] to confirm your monitor is  transmitting.  Returning your monitor  Remove your patch and place it inside the [place]. In the lower half of the Gateway there is a white  bag with prepaid postage on it. Place Gateway in bag and seal. Mail package back to [place] as soon as  possible. Your physician should have your final report approximately 7 days after you have mailed back  your monitor. Call [place] at [phone] if you have questions regarding your ZIO AT  patch monitor. Call them immediately if you see an orange light blinking on your monitor.  If your monitor falls off in less than 4 days, contact our Monitor department at [phone]. If your  monitor becomes loose or falls off after 4 days call [employer] at [phone] for suggestions on  securing your monitor    Follow-Up:  At [facility], you and your health needs are our priority.  As part of our continuing mission to provide you with exceptional heart care, we have created designated Provider Care Teams.  These Care Teams include your primary Cardiologist (physician) and Advanced Practice Providers (APPs -  Physician Assistants and Nurse Practitioners) who all work together to provide you with the care you need, when you need it.  We recommend signing up for the patient portal called "MyChart".  Sign up information is provided on this After Visit Summary.  MyChart is used to connect with patients for Virtual Visits (Telemedicine).  Patients are able to view lab/test results, encounter notes, upcoming appointments, etc.  Non-urgent messages can be sent to your provider as well.   To learn more about what you can do with MyChart, go to [email or web address].    Your next appointment:   6 week(s)  The format for your next appointment:   In Person  Provider:   Almyra Deforest, PA-C      Signed, Almyra Deforest, [place]  01/22/2022 11:38 AM    [place]

## 2022-01-21 NOTE — Telephone Encounter (Signed)
Seen patient today, ordered heart monitor. Orthostatic vital sign positive, SBP dropped down to 60s upon standing. Stopped amlodipine and Imdur. Pending reassessment in 6 weeks

## 2022-01-22 ENCOUNTER — Inpatient Hospital Stay (HOSPITAL_COMMUNITY)
Admission: EM | Admit: 2022-01-22 | Discharge: 2022-01-25 | DRG: 378 | Disposition: A | Discharge status: Home / Self Care | Payer: Self-pay | Attending: Family Medicine | Admitting: Family Medicine

## 2022-01-22 ENCOUNTER — Telehealth: Payer: Self-pay | Admitting: *Deleted

## 2022-01-22 ENCOUNTER — Observation Stay (HOSPITAL_COMMUNITY): Payer: Self-pay

## 2022-01-22 ENCOUNTER — Other Ambulatory Visit: Payer: Self-pay

## 2022-01-22 ENCOUNTER — Encounter: Payer: Self-pay | Admitting: Physician Assistant

## 2022-01-22 ENCOUNTER — Ambulatory Visit: Payer: Self-pay

## 2022-01-22 DIAGNOSIS — S62635A Displaced fracture of distal phalanx of left ring finger, initial encounter for closed fracture: Secondary | ICD-10-CM | POA: Insufficient documentation

## 2022-01-22 DIAGNOSIS — Z7982 Long term (current) use of aspirin: Secondary | ICD-10-CM

## 2022-01-22 DIAGNOSIS — Z955 Presence of coronary angioplasty implant and graft: Secondary | ICD-10-CM

## 2022-01-22 DIAGNOSIS — Y929 Unspecified place or not applicable: Secondary | ICD-10-CM

## 2022-01-22 DIAGNOSIS — R739 Hyperglycemia, unspecified: Secondary | ICD-10-CM

## 2022-01-22 DIAGNOSIS — E278 Other specified disorders of adrenal gland: Secondary | ICD-10-CM | POA: Diagnosis present

## 2022-01-22 DIAGNOSIS — Z87891 Personal history of nicotine dependence: Secondary | ICD-10-CM

## 2022-01-22 DIAGNOSIS — X58XXXA Exposure to other specified factors, initial encounter: Secondary | ICD-10-CM | POA: Diagnosis present

## 2022-01-22 DIAGNOSIS — M109 Gout, unspecified: Secondary | ICD-10-CM | POA: Diagnosis present

## 2022-01-22 DIAGNOSIS — I951 Orthostatic hypotension: Secondary | ICD-10-CM | POA: Insufficient documentation

## 2022-01-22 DIAGNOSIS — K219 Gastro-esophageal reflux disease without esophagitis: Secondary | ICD-10-CM | POA: Diagnosis present

## 2022-01-22 DIAGNOSIS — Z888 Allergy status to other drugs, medicaments and biological substances status: Secondary | ICD-10-CM

## 2022-01-22 DIAGNOSIS — F12929 Cannabis use, unspecified with intoxication, unspecified: Secondary | ICD-10-CM | POA: Diagnosis present

## 2022-01-22 DIAGNOSIS — Z79899 Other long term (current) drug therapy: Secondary | ICD-10-CM

## 2022-01-22 DIAGNOSIS — F1010 Alcohol abuse, uncomplicated: Secondary | ICD-10-CM | POA: Diagnosis present

## 2022-01-22 DIAGNOSIS — F1090 Alcohol use, unspecified, uncomplicated: Secondary | ICD-10-CM | POA: Insufficient documentation

## 2022-01-22 DIAGNOSIS — G4733 Obstructive sleep apnea (adult) (pediatric): Secondary | ICD-10-CM | POA: Diagnosis present

## 2022-01-22 DIAGNOSIS — Z7902 Long term (current) use of antithrombotics/antiplatelets: Secondary | ICD-10-CM

## 2022-01-22 DIAGNOSIS — K0889 Other specified disorders of teeth and supporting structures: Secondary | ICD-10-CM

## 2022-01-22 DIAGNOSIS — Z791 Long term (current) use of non-steroidal anti-inflammatories (NSAID): Secondary | ICD-10-CM

## 2022-01-22 DIAGNOSIS — I2510 Atherosclerotic heart disease of native coronary artery without angina pectoris: Secondary | ICD-10-CM | POA: Diagnosis present

## 2022-01-22 DIAGNOSIS — E785 Hyperlipidemia, unspecified: Secondary | ICD-10-CM | POA: Diagnosis present

## 2022-01-22 DIAGNOSIS — K922 Gastrointestinal hemorrhage, unspecified: Secondary | ICD-10-CM

## 2022-01-22 DIAGNOSIS — I10 Essential (primary) hypertension: Secondary | ICD-10-CM | POA: Diagnosis present

## 2022-01-22 DIAGNOSIS — K31811 Angiodysplasia of stomach and duodenum with bleeding: Principal | ICD-10-CM

## 2022-01-22 DIAGNOSIS — K921 Melena: Secondary | ICD-10-CM

## 2022-01-22 DIAGNOSIS — Z789 Other specified health status: Secondary | ICD-10-CM | POA: Insufficient documentation

## 2022-01-22 DIAGNOSIS — D649 Anemia, unspecified: Principal | ICD-10-CM | POA: Diagnosis present

## 2022-01-22 DIAGNOSIS — D509 Iron deficiency anemia, unspecified: Secondary | ICD-10-CM

## 2022-01-22 DIAGNOSIS — Z01810 Encounter for preprocedural cardiovascular examination: Secondary | ICD-10-CM

## 2022-01-22 DIAGNOSIS — D62 Acute posthemorrhagic anemia: Secondary | ICD-10-CM

## 2022-01-22 LAB — CBC WITH DIFFERENTIAL/PLATELET
Abs Immature Granulocytes: 0.03 10*3/uL (ref 0.00–0.07)
Basophils Absolute: 0.1 10*3/uL (ref 0.0–0.1)
Basophils Relative: 1 %
Eosinophils Absolute: 0.1 10*3/uL (ref 0.0–0.5)
Eosinophils Relative: 1 %
HCT: 22.2 % — ABNORMAL LOW (ref 39.0–52.0)
Hemoglobin: 6.8 g/dL — CL (ref 13.0–17.0)
Immature Granulocytes: 0 %
Lymphocytes Relative: 28 %
Lymphs Abs: 3 10*3/uL (ref 0.7–4.0)
MCH: 23.3 pg — ABNORMAL LOW (ref 26.0–34.0)
MCHC: 30.6 g/dL (ref 30.0–36.0)
MCV: 76 fL — ABNORMAL LOW (ref 80.0–100.0)
Monocytes Absolute: 0.9 10*3/uL (ref 0.1–1.0)
Monocytes Relative: 9 %
Neutro Abs: 6.3 10*3/uL (ref 1.7–7.7)
Neutrophils Relative %: 61 %
Platelets: 290 10*3/uL (ref 150–400)
RBC: 2.92 MIL/uL — ABNORMAL LOW (ref 4.22–5.81)
RDW: 18 % — ABNORMAL HIGH (ref 11.5–15.5)
WBC: 10.4 10*3/uL (ref 4.0–10.5)
nRBC: 0.2 % (ref 0.0–0.2)

## 2022-01-22 LAB — PROTIME-INR
INR: 1.2 (ref 0.8–1.2)
Prothrombin Time: 15 seconds (ref 11.4–15.2)

## 2022-01-22 LAB — COMPREHENSIVE METABOLIC PANEL
ALT: 23 U/L (ref 0–44)
AST: 40 U/L (ref 15–41)
Albumin: 3.6 g/dL (ref 3.5–5.0)
Alkaline Phosphatase: 90 U/L (ref 38–126)
Anion gap: 5 (ref 5–15)
BUN: 8 mg/dL (ref 6–20)
CO2: 21 mmol/L — ABNORMAL LOW (ref 22–32)
Calcium: 8.2 mg/dL — ABNORMAL LOW (ref 8.9–10.3)
Chloride: 109 mmol/L (ref 98–111)
Creatinine, Ser: 0.72 mg/dL (ref 0.61–1.24)
GFR, Estimated: >60 mL/min (ref >60)
Glucose, Bld: 112 mg/dL — ABNORMAL HIGH (ref 70–99)
Potassium: 4 mmol/L (ref 3.5–5.1)
Sodium: 135 mmol/L (ref 135–145)
Total Bilirubin: 1 mg/dL (ref 0.3–1.2)
Total Protein: 7.3 g/dL (ref 6.5–8.1)

## 2022-01-22 LAB — BASIC METABOLIC PANEL
BUN/Creatinine Ratio: 13 (ref 9–20)
BUN: 11 mg/dL (ref 6–24)
CO2: 21 mmol/L (ref 20–29)
Calcium: 8.4 mg/dL — ABNORMAL LOW (ref 8.7–10.2)
Chloride: 104 mmol/L (ref 96–106)
Creatinine, Ser: 0.84 mg/dL (ref 0.76–1.27)
Glucose: 101 mg/dL — ABNORMAL HIGH (ref 70–99)
Potassium: 4.1 mmol/L (ref 3.5–5.2)
Sodium: 135 mmol/L (ref 134–144)
eGFR: 106 mL/min/{1.73_m2} (ref >59)

## 2022-01-22 LAB — POC OCCULT BLOOD, ED: Fecal Occult Bld: POSITIVE — AB

## 2022-01-22 LAB — CBC
Hematocrit: 21.6 % — ABNORMAL LOW (ref 37.5–51.0)
Hemoglobin: 6.5 g/dL — CL (ref 13.0–17.7)
MCH: 23.1 pg — ABNORMAL LOW (ref 26.6–33.0)
MCHC: 30.1 g/dL — ABNORMAL LOW (ref 31.5–35.7)
MCV: 77 fL — ABNORMAL LOW (ref 79–97)
Platelets: 288 10*3/uL (ref 150–450)
RBC: 2.81 x10E6/uL — ABNORMAL LOW (ref 4.14–5.80)
RDW: 17.2 % — ABNORMAL HIGH (ref 11.6–15.4)
WBC: 10.5 10*3/uL (ref 3.4–10.8)

## 2022-01-22 LAB — PREPARE RBC (CROSSMATCH)

## 2022-01-22 LAB — ABO/RH: ABO/RH(D): AB POS

## 2022-01-22 LAB — TSH: TSH: 2.32 u[IU]/mL (ref 0.450–4.500)

## 2022-01-22 MED ORDER — PANTOPRAZOLE 80MG IVPB - SIMPLE MED
80.0000 mg | Freq: Once | INTRAVENOUS | Status: AC
  Administered 2022-01-22: 80 mg via INTRAVENOUS
  Filled 2022-01-22: qty 100

## 2022-01-22 MED ORDER — SODIUM CHLORIDE 0.9% IV SOLUTION: Freq: Once | INTRAVENOUS | Status: AC

## 2022-01-22 MED ORDER — FOLIC ACID 1 MG PO TABS
1.0000 mg | ORAL_TABLET | Freq: Every day | ORAL | Status: DC
  Administered 2022-01-22 – 2022-01-25 (×3): 1 mg via ORAL
  Filled 2022-01-22 (×3): qty 1

## 2022-01-22 MED ORDER — THIAMINE MONONITRATE 100 MG PO TABS
100.0000 mg | ORAL_TABLET | Freq: Every day | ORAL | Status: DC
  Administered 2022-01-22 – 2022-01-25 (×3): 100 mg via ORAL
  Filled 2022-01-22 (×3): qty 1

## 2022-01-22 MED ORDER — ADULT MULTIVITAMIN W/MINERALS CH
1.0000 | ORAL_TABLET | Freq: Every day | ORAL | Status: DC
  Administered 2022-01-22 – 2022-01-25 (×3): 1 via ORAL
  Filled 2022-01-22 (×3): qty 1

## 2022-01-22 MED ORDER — ROSUVASTATIN CALCIUM 20 MG PO TABS
40.0000 mg | ORAL_TABLET | Freq: Every day | ORAL | Status: DC
  Administered 2022-01-24 – 2022-01-25 (×2): 40 mg via ORAL
  Filled 2022-01-22 (×2): qty 2

## 2022-01-22 MED ORDER — OCTREOTIDE LOAD VIA INFUSION
50.0000 ug | Freq: Once | INTRAVENOUS | Status: AC
  Administered 2022-01-23: 50 ug via INTRAVENOUS
  Filled 2022-01-22: qty 25

## 2022-01-22 MED ORDER — THIAMINE HCL 100 MG/ML IJ SOLN
100.0000 mg | Freq: Every day | INTRAMUSCULAR | Status: DC
  Filled 2022-01-22 (×2): qty 2

## 2022-01-22 MED ORDER — SODIUM CHLORIDE 0.9 % IV SOLN
1.0000 g | Freq: Once | INTRAVENOUS | Status: AC
  Administered 2022-01-22: 1 g via INTRAVENOUS
  Filled 2022-01-22: qty 10

## 2022-01-22 MED ORDER — PANTOPRAZOLE SODIUM 40 MG IV SOLR: 40.0000 mg | Freq: Two times a day (BID) | INTRAVENOUS | Status: DC

## 2022-01-22 MED ORDER — PANTOPRAZOLE INFUSION (NEW) - SIMPLE MED
8.0000 mg/h | INTRAVENOUS | Status: DC
  Administered 2022-01-22 – 2022-01-23 (×2): 8 mg/h via INTRAVENOUS
  Filled 2022-01-22 (×3): qty 100

## 2022-01-22 MED ORDER — SODIUM CHLORIDE 0.9 % IV SOLN
50.0000 ug/h | INTRAVENOUS | Status: DC
  Administered 2022-01-23: 50 ug/h via INTRAVENOUS
  Filled 2022-01-22 (×2): qty 1

## 2022-01-22 MED ORDER — ACETAMINOPHEN 10 MG/ML IV SOLN
1000.0000 mg | Freq: Four times a day (QID) | INTRAVENOUS | Status: DC
  Filled 2022-01-22 (×4): qty 100

## 2022-01-22 MED ORDER — ALLOPURINOL 100 MG PO TABS
100.0000 mg | ORAL_TABLET | Freq: Every day | ORAL | Status: DC
  Administered 2022-01-24 – 2022-01-25 (×2): 100 mg via ORAL
  Filled 2022-01-22 (×3): qty 1

## 2022-01-22 MED ORDER — CARVEDILOL 12.5 MG PO TABS
12.5000 mg | ORAL_TABLET | Freq: Two times a day (BID) | ORAL | Status: DC
  Administered 2022-01-22: 12.5 mg via ORAL
  Filled 2022-01-22: qty 1

## 2022-01-22 NOTE — Assessment & Plan Note (Addendum)
No history of withdrawal. -Monitor CIWA's -TOC for counseling -MVI, folic acid 1 mg, thiamine 100 mg daily

## 2022-01-22 NOTE — Consult Note (Signed)
CONSULTATION NOTE   Patient Name: Gene Reed Date of Encounter: 01/22/2022 Cardiologist: Nicki Guadalajara, MD Electrophysiologist: None Advanced Heart Failure: None   Chief Complaint   Fatigue, syncope  Patient Profile   52 yo male with recent coronary stent, presents with syncope and fatigue, found to be anemic with concern for GI bleed  HPI   Gene Reed is a 51 y.o. male who is being seen today for the evaluation of syncope at the request of Dr. Miquel Dunn. This is a 51 yo male patient of Dr. Tresa Endo, who was evaluated in the office yesterday by Azalee Course, PA-C.  According to Gene Reed, the story is as follows:  "51 y.o. male with a hx of CAD, HTN, HLD, adrenal nodule noted on CTA 11/2021, GERD, gout, OSA and chronic knee pain.  Patient was initially referred to Dr. Tresa Endo August 2023 for evaluation of chest pain.  The chest discomfort sound rather atypical and musculoskeletal in nature.  Coronary calcium score and echocardiogram were ordered.  Echocardiogram showed EF 65 to 70%, no regional wall motion abnormality, mild LVH, normal diastolic parameters, mild MR.  Coronary calcium score come back elevated at 1803 which placed the patient in 99th percentile for age and sex matched control.  Coronary CT was ordered which showed possible severe proximal RCA stenosis and moderate LAD and left circumflex stenosis, FFR was unable to be processed.  CTA also revealed incidentally of 3 cm left adrenal nodule for which additional imaging was recommended.  He was seen in the cardiology office and eventually set up for cardiac catheterization on 12/06/2021 which revealed a 15% stenosis in mid to distal left main, 90% stenosis in proximal RCA followed by 20% stenosis in proximal to mid RCA, 40% stenosis in proximal to mid LAD followed by 75% and 40% stenosis in mid LAD, 20% stenosis in proximal left circumflex artery and a 20% OM 2.  He underwent difficult but successful PCI to the severely calcified proximal RCA  with PTCA, scoring balloon and ultimately insertion of a drug-eluting stent.  LAD disease was treated medically with plans for staged PCI to LAD if he has recurrent angina.  Postprocedure, he was placed on aspirin and Brilinta as well as Imdur.  He was supposed to discuss with his PCP regarding the adrenal nodule.  His CPAP machine was managed by Dr. Tresa Endo.   Patient presents today for evaluation of syncope.  He says he has been having intermittent dizziness at work recently.  He also feels dizzy when he first get up in the morning.  He has been compliant with his medications.  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  Prior to passing out spell, he had flushing sensation and cold sweat.  Although the symptom sounds like a vasovagal episode, his orthostatic vital sign was also quite positive as well, see below.  I also recommended a 2-week ZIO AT monitor, blood work including basic metabolic panel, CBC and TSH.  I gave the patient a work note for no driving for at least 6 weeks until cleared by cardiology service.  He can follow-up in 6 weeks to review heart monitor and reassess blood pressure."  Subsequently, labwork yesterday showed profound anemia with H/H of 6.5/21.6, down from 11.0/31.6 from 12/12/2021. He reports having dark stools, but not "tarry" and the triage nurse had advised him to look for that. The dark stools have gone on for a few weeks. He was typed  and crossmatched today for blood transfusion which is underway. Cardiology is asked to evaluate and recommend antiplatelet management in the setting of presumed acute GIB.  PMHx   Past Medical History:  Diagnosis Date   Adrenal nodule (HCC)    Arthritis    Bilateral chronic knee pain    Bone spur    CAD (coronary artery disease)    Gout    Hyperlipidemia    Hypertension     Past Surgical History:  Procedure Laterality Date   CORONARY LITHOTRIPSY N/A 12/06/2021   Procedure: CORONARY  LITHOTRIPSY;  Surgeon: Lennette Bihari, MD;  Location: [facility] INVASIVE CV LAB;  Service: Cardiovascular;  Laterality: N/A;   CORONARY STENT INTERVENTION Right 12/06/2021   Procedure: CORONARY STENT INTERVENTION;  Surgeon: Lennette Bihari, MD;  Location: [facility] INVASIVE CV LAB;  Service: Cardiovascular;  Laterality: Right;   KNEE SURGERY     LEFT HEART CATH AND CORONARY ANGIOGRAPHY N/A 12/06/2021   Procedure: LEFT HEART CATH AND CORONARY ANGIOGRAPHY;  Surgeon: Lennette Bihari, MD;  Location: [facility] INVASIVE CV LAB;  Service: Cardiovascular;  Laterality: N/A;    FAMHx   No family history on file.  Non-contributory.  SOCHx    reports that he has quit smoking. He has never used smokeless tobacco. He reports current alcohol use. He reports current drug use. Drug: Marijuana.  Outpatient Medications   No current facility-administered medications on file prior to encounter.   Current Outpatient Medications on File Prior to Encounter  Medication Sig Dispense Refill   allopurinol (ZYLOPRIM) 100 MG tablet TAKE 1 TABLET (100 MG TOTAL) BY MOUTH DAILY. (Patient taking differently: Take 100 mg by mouth daily.) 90 tablet 3   aspirin EC 81 MG tablet Take 1 tablet (81 mg total) by mouth daily. Swallow whole. 90 tablet 3   carvedilol (COREG) 12.5 MG tablet Take 1 tablet (12.5 mg total) by mouth 2 (two) times daily. 180 tablet 3   nitroGLYCERIN (NITROSTAT) 0.4 MG SL tablet Place 1 tablet (0.4 mg total) under the tongue every 5 (five) minutes as needed for chest pain. 25 tablet 3   omeprazole (PRILOSEC) 20 MG capsule TAKE 1 CAPSULE (20 MG TOTAL) BY MOUTH DAILY. (Patient taking differently: Take 20 mg by mouth daily.) 90 capsule 3   rosuvastatin (CRESTOR) 40 MG tablet Take 1 tablet (40 mg total) by mouth daily. 30 tablet 5   ticagrelor (BRILINTA) 90 MG TABS tablet Take 1 tablet (90 mg total) by mouth 2 (two) times daily. 60 tablet 11   ezetimibe (ZETIA) 10 MG tablet Take 1 tablet (10 mg total) by mouth daily. (Patient not  taking: Reported on 01/22/2022) 90 tablet 3    Inpatient Medications    Scheduled Meds:  sodium chloride   Intravenous Once   [START ON 01/23/2022] allopurinol  100 mg Oral Daily   carvedilol  12.5 mg Oral BID   folic acid  1 mg Oral Daily   multivitamin with minerals  1 tablet Oral Daily   octreotide  50 mcg Intravenous Once   [START ON 01/26/2022] pantoprazole  40 mg Intravenous Q12H   [START ON 01/23/2022] rosuvastatin  40 mg Oral Daily   thiamine  100 mg Oral Daily   Or   thiamine  100 mg Intravenous Daily    Continuous Infusions:  acetaminophen     octreotide (SANDOSTATIN) 500 mcg in sodium chloride 0.9 % 250 mL (2 mcg/mL) infusion     pantoprazole 8 mg/hr (01/22/22 1843)    PRN Meds:  ALLERGIES   Allergies  Allergen Reactions   Poison Ivy Extract Itching    Poison oak    ROS   Pertinent items noted in HPI and remainder of comprehensive ROS otherwise negative.  Vitals   Vitals:   01/22/22 1410 01/22/22 1700 01/22/22 1847 01/22/22 1915  BP: 131/74 124/81 (!) 139/92 135/83  Pulse: 81 73 84 81  Resp: 18 (!) 22 18 18   Temp: 98.6 F (37 C)  98.3 F (36.8 C) 98.1 F (36.7 C)  TempSrc:   Oral Oral  SpO2: 100% 100% 100% 100%    Intake/Output Summary (Last 24 hours) at 01/22/2022 2112 Last data filed at 01/22/2022 1913 Gross per 24 hour  Intake 200.87 ml  Output --  Net 200.87 ml   There were no vitals filed for this visit.  Physical Exam   General appearance: alert and no distress Neck: no carotid bruit, no JVD, and thyroid not enlarged, symmetric, no tenderness/mass/nodules Lungs: clear to auscultation bilaterally Heart: regular rate and rhythm Abdomen: soft, non-tender; bowel sounds normal; no masses,  no organomegaly Extremities: extremities normal, atraumatic, no cyanosis or edema Pulses: 2+ and symmetric Skin: Skin color, texture, turgor normal. No rashes or lesions Neurologic: Grossly normal Psych: Pleasant  Labs   Results for orders  placed or performed during the hospital encounter of 01/22/22 (from the past 48 hour(s))  Comprehensive metabolic panel     Status: Abnormal   Collection Time: 01/22/22 12:50 PM  Result Value Ref Range   Sodium 135 135 - 145 mmol/L   Potassium 4.0 3.5 - 5.1 mmol/L   Chloride 109 98 - 111 mmol/L   CO2 21 (L) 22 - 32 mmol/L   Glucose, Bld 112 (H) 70 - 99 mg/dL    Comment: Glucose reference range applies only to samples taken after fasting for at least 8 hours.   BUN 8 6 - 20 mg/dL   Creatinine, Ser 1.610.72 0.61 - 1.24 mg/dL   Calcium 8.2 (L) 8.9 - 10.3 mg/dL   Total Protein 7.3 6.5 - 8.1 g/dL   Albumin 3.6 3.5 - 5.0 g/dL   AST 40 15 - 41 U/L   ALT 23 0 - 44 U/L   Alkaline Phosphatase 90 38 - 126 U/L   Total Bilirubin 1.0 0.3 - 1.2 mg/dL   GFR, Estimated >09>60 >60>60 mL/min    Comment: (NOTE) Calculated using the CKD-EPI Creatinine Equation (2021)    Anion gap 5 5 - 15    Comment: Performed at [facility], [address] [place], [place] [address]  Protime-INR     Status: None   Collection Time: 01/22/22 12:50 PM  Result Value Ref Range   Prothrombin Time 15.0 11.4 - 15.2 seconds   INR 1.2 0.8 - 1.2    Comment: (NOTE) INR goal varies based on device and disease states. Performed at [facility], [address] [place], [place] [address]   Type and screen [name]     Status: None (Preliminary result)   Collection Time: 01/22/22 12:50 PM  Result Value Ref Range   ABO/RH(D) AB POS    Antibody Screen NEG    Sample Expiration      01/25/2022,2359 Performed at [facility], [address] [place], [place] [address]    Unit Number [id number]    Blood Component Type RED CELLS,LR    Unit division 00    Status of Unit ISSUED    Transfusion Status OK  TO TRANSFUSE    Crossmatch Result Compatible    Unit Number [id number]    Blood Component Type RED CELLS,LR    Unit division 00    Status of Unit ALLOCATED    Transfusion Status OK TO  TRANSFUSE    Crossmatch Result Compatible   CBC with Differential     Status: Abnormal   Collection Time: 01/22/22 12:50 PM  Result Value Ref Range   WBC 10.4 4.0 - 10.5 K/uL   RBC 2.92 (L) 4.22 - 5.81 MIL/uL   Hemoglobin 6.8 (LL) 13.0 - 17.0 g/dL    Comment: REPEATED TO VERIFY Reticulocyte Hemoglobin testing may be clinically indicated, consider ordering this additional test [id number] THIS CRITICAL RESULT HAS VERIFIED AND BEEN CALLED TO C.ROWE RN BY KATHERINE MCCORMICK ON 12 19 2023 AT 1352, AND HAS BEEN READ BACK.     HCT 22.2 (L) 39.0 - 52.0 %   MCV 76.0 (L) 80.0 - 100.0 fL   MCH 23.3 (L) 26.0 - 34.0 pg   MCHC 30.6 30.0 - 36.0 g/dL   RDW 35.4 (H) 65.6 - 81.2 %   Platelets 290 150 - 400 K/uL   nRBC 0.2 0.0 - 0.2 %   Neutrophils Relative % 61 %   Neutro Abs 6.3 1.7 - 7.7 K/uL   Lymphocytes Relative 28 %   Lymphs Abs 3.0 0.7 - 4.0 K/uL   Monocytes Relative 9 %   Monocytes Absolute 0.9 0.1 - 1.0 K/uL   Eosinophils Relative 1 %   Eosinophils Absolute 0.1 0.0 - 0.5 K/uL   Basophils Relative 1 %   Basophils Absolute 0.1 0.0 - 0.1 K/uL   Immature Granulocytes 0 %   Abs Immature Granulocytes 0.03 0.00 - 0.07 K/uL    Comment: Performed at [facility], [address] [place], [place] [address]  ABO/Rh     Status: None   Collection Time: 01/22/22  1:05 PM  Result Value Ref Range   ABO/RH(D)      AB POS Performed at [facility], [address] [place], [place] [address]   POC occult blood, ED     Status: Abnormal   Collection Time: 01/22/22  5:01 PM  Result Value Ref Range   Fecal Occult Bld POSITIVE (A) NEGATIVE  Prepare RBC (crossmatch)     Status: None   Collection Time: 01/22/22  5:01 PM  Result Value Ref Range   Order Confirmation      ORDER PROCESSED BY BLOOD BANK Performed at [facility], [address], [place], [place] [address]   Prepare RBC (crossmatch)     Status: None   Collection Time: 01/22/22  7:09 PM  Result Value Ref Range   Order  Confirmation      ORDER PROCESSED BY BLOOD BANK Performed at [facility], [address] [place], [place] [address]     ECG   NSR at 86, RBBB (01/07/2022) - Personally Reviewed  Telemetry   Sinus rhythm with RBBB - Personally Reviewed  Radiology   No results found.  Cardiac Studies   N/A  Impression   Principal Problem:   Anemia Active Problems:   Hyperglycemia   Alcohol use   Tooth pain   Orthostatic syncope   Recommendation   Mr. Frayne had recent PCI to the RCA with a DES on 12/06/2021 and is now more than 1 month removed from this. He has been on DAPT with ASA/Brilinta. He likely had acute GIB, probably  upper. Now on IV PPI and undergoing blood transfusion. Would advise holding further antiplatelets for now, but would recommend expedited EGD and definitive identification and treatment of the etiology of the anemia, so as to allow a restart of aspirin at minimum and ultimately DAPT. He denies any anginal symptoms, although has a residual 75% mid-LAD lesion, not thought to be flow-limiting - he is at acceptable risk to proceed with GI procedures as necessary.   Thanks for the consultation. Cardiology will follow with you.  Time Spent Directly with Patient:  I have spent a total of 45 minutes with the patient reviewing hospital notes, telemetry, EKGs, labs and examining the patient as well as establishing an assessment and plan that was discussed personally with the patient.  > 50% of time was spent in direct patient care.  Length of Stay:  LOS: 0 days   Chrystie Nose, MD, [place], FACP  [place]  [facility]  Medical Director of the Advanced Lipid Disorders &  Cardiovascular Risk Reduction Clinic Diplomate of the American Board of Clinical Lipidology Attending Cardiologist  Direct Dial: [phone]  Fax: [phone]  Website:  [email or web address] Nicely Margia Wiesen 01/22/2022, 9:12 PM

## 2022-01-22 NOTE — ED Provider Notes (Signed)
[name] EMERGENCY DEPARTMENT Provider Note   CSN: [id number] Arrival date & time: 01/22/22  1219     History  Chief Complaint  Patient presents with   Abnormal Lab   GI Bleeding    Gene Reed is a 51 y.o. male with past medical history significant for hypertension, coronary artery disease with stent placed in November of this year with dark tarry stool, syncope, dizziness, shortness of breath with exertion.  Patient taking Brilinta and aspirin.  Patient reports that he was having some abdominal cramping in the last couple of weeks, with overall improvement, no abdominal pain today, reports over the last 2 to 3 days stool has been more normal consistency, not as dark, however still feeling significantly lightheaded, short of breath with exertion.  Reports history of alcohol abuse, with around 8 beers per day prior to recent coronary stent, patient additionally endorses history of frequent ibuprofen, BC powder use for arthritic pain.   Abnormal Lab      Home Medications Prior to Admission medications   Medication Sig Start Date End Date Taking? Authorizing Provider  allopurinol (ZYLOPRIM) 100 MG tablet TAKE 1 TABLET (100 MG TOTAL) BY MOUTH DAILY. 08/20/21 08/20/22  Massie Maroon, FNP  aspirin EC 81 MG tablet Take 1 tablet (81 mg total) by mouth daily. Swallow whole. 11/29/21   Marjie Skiff E, PA-C  carvedilol (COREG) 12.5 MG tablet Take 1 tablet (12.5 mg total) by mouth 2 (two) times daily. 01/07/22   Lennette Bihari, MD  ezetimibe (ZETIA) 10 MG tablet Take 1 tablet (10 mg total) by mouth daily. 01/07/22   Lennette Bihari, MD  nitroGLYCERIN (NITROSTAT) 0.4 MG SL tablet Place 1 tablet (0.4 mg total) under the tongue every 5 (five) minutes as needed for chest pain. 11/29/21 02/27/22  Marjie Skiff E, PA-C  omeprazole (PRILOSEC) 20 MG capsule TAKE 1 CAPSULE (20 MG TOTAL) BY MOUTH DAILY. 08/20/21 08/20/22  Massie Maroon, FNP  rosuvastatin (CRESTOR) 40 MG  tablet Take 1 tablet (40 mg total) by mouth daily. 12/07/21   Dunn, Tacey Ruiz, PA-C  ticagrelor (BRILINTA) 90 MG TABS tablet Take 1 tablet (90 mg total) by mouth 2 (two) times daily. 12/07/21   Laurann Montana, PA-C      Allergies    Poison ivy extract    Review of Systems   Review of Systems  Respiratory:  Positive for shortness of breath.   Gastrointestinal:  Positive for blood in stool.  Neurological:  Positive for syncope and weakness.  All other systems reviewed and are negative.   Physical Exam Updated Vital Signs BP 124/81   Pulse 73   Temp 98.6 F (37 C)   Resp (!) 22   SpO2 100%  Physical Exam Vitals and nursing note reviewed.  Constitutional:      General: He is not in acute distress.    Appearance: Normal appearance.  HENT:     Head: Normocephalic and atraumatic.     Comments: Pale mucous membranes throughout Eyes:     General:        Right eye: No discharge.        Left eye: No discharge.  Cardiovascular:     Rate and Rhythm: Normal rate and regular rhythm.     Heart sounds: No murmur heard.    No friction rub. No gallop.  Pulmonary:     Effort: Pulmonary effort is normal.     Breath sounds: Normal breath sounds.  Abdominal:  General: Bowel sounds are normal.     Palpations: Abdomen is soft.  Genitourinary:    Comments: Somewhat dark brown appearance of stool, not overtly black, no bright red blood Skin:    General: Skin is warm and dry.     Capillary Refill: Capillary refill takes less than 2 seconds.  Neurological:     Mental Status: He is alert and oriented to person, place, and time.  Psychiatric:        Mood and Affect: Mood normal.        Behavior: Behavior normal.     ED Results / Procedures / Treatments   Labs (all labs ordered are listed, but only abnormal results are displayed) Labs Reviewed  COMPREHENSIVE METABOLIC PANEL - Abnormal; Notable for the following components:      Result Value   CO2 21 (*)    Glucose, Bld 112 (*)     Calcium 8.2 (*)    All other components within normal limits  CBC WITH DIFFERENTIAL/PLATELET - Abnormal; Notable for the following components:   RBC 2.92 (*)    Hemoglobin 6.8 (*)    HCT 22.2 (*)    MCV 76.0 (*)    MCH 23.3 (*)    RDW 18.0 (*)    All other components within normal limits  POC OCCULT BLOOD, ED - Abnormal; Notable for the following components:   Fecal Occult Bld POSITIVE (*)    All other components within normal limits  PROTIME-INR  TYPE AND SCREEN  ABO/RH  PREPARE RBC (CROSSMATCH)    EKG None  Radiology No results found.  Procedures .Critical Care  Performed by: Olene Floss, PA-C Authorized by: Olene Floss, PA-C   Critical care provider statement:    Critical care time (minutes):  30   Critical care was necessary to treat or prevent imminent or life-threatening deterioration of the following conditions:  Circulatory failure   Critical care was time spent personally by me on the following activities:  Development of treatment plan with patient or surrogate, discussions with consultants, evaluation of patient's response to treatment, examination of patient, ordering and review of laboratory studies, ordering and review of radiographic studies, ordering and performing treatments and interventions, pulse oximetry, re-evaluation of patient's condition and review of old charts   Care discussed with: admitting provider       Medications Ordered in ED Medications  pantoprazole (PROTONIX) 80 mg /NS 100 mL IVPB (80 mg Intravenous New Bag/Given 01/22/22 1754)  pantoprozole (PROTONIX) 80 mg /NS 100 mL infusion (has no administration in time range)  pantoprazole (PROTONIX) injection 40 mg (has no administration in time range)  octreotide (SANDOSTATIN) 2 mcg/mL load via infusion 50 mcg (has no administration in time range)    And  octreotide (SANDOSTATIN) 500 mcg in sodium chloride 0.9 % 250 mL (2 mcg/mL) infusion (has no administration in time  range)  cefTRIAXone (ROCEPHIN) 1 g in sodium chloride 0.9 % 100 mL IVPB (has no administration in time range)  0.9 %  sodium chloride infusion (Manually program via Guardrails IV Fluids) (0 mLs Intravenous Stopped 01/22/22 1753)    ED Course/ Medical Decision Making/ A&P                           Medical Decision Making Amount and/or Complexity of Data Reviewed Labs: ordered.  Risk Prescription drug management.   This patient is a 51 y.o. male who presents to the ED for concern of  dark, tarry stools, syncope, weakness, shortness of breath with exertion, this involves an extensive number of treatment options, and is a complaint that carries with it a high risk of complications and morbidity. The emergent differential diagnosis prior to evaluation includes, but is not limited to, symptomatic anemia secondary to GI bleed, COPD, asthma, atypical ACS, angina, other cardiogenic or neurogenic syncope versus other.   This is not an exhaustive differential.   Past Medical History / Co-morbidities / Social History: Coronary artery disease recent stent placement in November of this year, currently taking aspirin Brilinta, history of alcohol abuse, frequent NSAID use  Additional history: Chart reviewed. Pertinent results include: Reviewed lab work, imaging from recent admission for coronary artery stenting  Physical Exam: Physical exam performed. The pertinent findings include: Patient with slight darker quality of stool, no acute distress, pale mucous membranes. No abdominal ttp.  Lab Tests: I ordered, and personally interpreted labs.  The pertinent results include:  CBC notable for anemia, 6.8 today from baseline at normal range, precipitous decrease noted. Microcytic quality with MCV at 76. No leukocytosis or platelet dysfunction noted.  Hemoccult positive stool.  Patient is AB+, type and screen performed.  Medications: I ordered medication including fluid bolus, Protonix, octreotide,  rocephin for upper GI bleed. Patient will require admission, transfusion and re-evaluation.  Consultations Obtained: I requested consultation with the family medicine resident service, spoke with Dr. Royal Piedra,  and discussed lab and imaging findings as well as pertinent plan - they recommend: admission, transfusion. We are pending GI recommendations at this time, but no evidence of hemodynamic instability at this time.   Disposition: After consideration of the diagnostic results and the patients response to treatment, I feel that patient would benefit from admission, GI consultation as discussed above.. .  I discussed this case with my attending physician Dr. Rodena Medin who cosigned this note including patient's presenting symptoms, physical exam, and planned diagnostics and interventions. Attending physician stated agreement with plan or made changes to plan which were implemented.    Final Clinical Impression(s) / ED Diagnoses Final diagnoses:  Symptomatic anemia  Upper GI bleed    Rx / DC Orders ED Discharge Orders     None         [place] 01/22/22 1802    Wynetta Fines, MD 01/22/22 2307

## 2022-01-22 NOTE — ED Provider Triage Note (Signed)
Emergency Medicine Provider Triage Evaluation Note  Gene Reed , a 51 y.o. male  was evaluated in triage.  Pt complains of symptomatic anemia.  Dark stools x 1 month. Patient has been having lightheadedness shortness of breath near syncope for almost 2 months.  Seen yesterday for syncope evaluation at [facility].  He got a call today that stated that his hemoglobin was very low and he needed to go to the emergency room.  Review of EMR shows hemoglobin of 6.3 down 5 units over 1 month.  He has been on Brilinta and aspirin.  Review of Systems  Positive: Symptomatic anemia  Negative: Abdominal pain  Physical Exam  There were no vitals taken for this visit. Gen:   Awake, no distress   Resp:  Normal effort MSK:   Moves extremities without difficulty  Other:  pale  Medical Decision Making  Medically screening exam initiated at 12:41 PM.  Appropriate orders placed.  Gene Reed was informed that the remainder of the evaluation will be completed by another provider, this initial triage assessment does not replace that evaluation, and the importance of remaining in the ED until their evaluation is complete.     Arthor Captain, PA-C 01/22/22 1245

## 2022-01-22 NOTE — Progress Notes (Signed)
FMTS Brief Progress Note  S: Night rounds. Sister at bedside. Patient reports no pain, NAD. Simply hungry and thirsty. Getting second unit of blood, tolerating well.   O: BP 124/77   Pulse 85   Temp 98.7 F (37.1 C) (Oral)   Resp 20   SpO2 99%   Gen: awake, alert oriented x4, NAD Card: systolic murmur at right upper sternum, regular rate and rhythm Resp: CTAB Abd: mildly distended, soft, nontender, BS present Ext: left ring finger with bloody nail injury, nail appears still fully attached, lateral lacerations oozing blood  A/P: Anemia Continue with second unit. Transfusion threshold 8 due to recent stent. Off DAPT per cards. Will follow up post-H&H. NPO until tomorrow AM, likely EGD w/ GI. GI to see in AM.   Left ring finger injury Nail intact and in place, will simply XR to evaluate.    Fayette Pho, MD 01/22/2022, 10:58 PM PGY-3, [facility] Night Resident  Please page [phone] with questions.

## 2022-01-22 NOTE — Telephone Encounter (Addendum)
[facility]  representative - Patience.called  Reporting Hgb 6.5  lab was drawn 01/21/22   RN will notify  doctor of the day and  PA who saw the patient on 01/21/22  and Primary cardiologist

## 2022-01-22 NOTE — Assessment & Plan Note (Addendum)
Repeat glucose 78. -Follow-up A1c

## 2022-01-22 NOTE — ED Triage Notes (Signed)
Pt with symptomatic anemia x 2 months. On brillinta and ASA, having dark tarry stool, syncope, and dizziness. Seen at [facility] yesterday and hemoglobin 6.5.

## 2022-01-22 NOTE — Telephone Encounter (Signed)
I saw the patient yesterday for syncope and orthostatic hypotension.  Blood work revealed severe anemia.  Hemoglobin dropped from 11 from 12/12/2021 down to 6.5 on 01/21/2022.  Significant anemia is likely contributing to the orthostatic hypotension and recent syncope.  I have instructed the patient to go to the [facility], ED for further evaluation and potentially will need a blood transfusion.  Unclear source of bleeding.  Patient did report a crushed finger injury severe enough to lose his nail.  However not entirely clear if this is enough to lose 4-1/2 units of blood.  He also reported some dark stools as well.  GI source is more likely in this case.  Patient appreciated the call and will go to the emergency room.

## 2022-01-22 NOTE — Assessment & Plan Note (Signed)
Resolved. Denies concerns.

## 2022-01-22 NOTE — Assessment & Plan Note (Addendum)
S/p 2 pRBC transfusions, Hgb now 8.4. Threshold <8 given CAD. 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 Per GI, can d/c Octreotide and CTX and continue Protonix. Started on full liquids with plans to advance diet -GI consulted, appreciate recommendations -Protonix 40mg  BID x 4 weeks -Full liquids, to likely advance today -Resume ASA 81mg  tomorrow, likely to add back on Brilinta in coming days if Hgb stable -PM CBC -Anemia work up pending

## 2022-01-22 NOTE — H&P (Addendum)
Hospital Admission History and Physical Service Pager: [phone]  Patient name: Gene Reed Medical record number: [id number] Date of Birth: 06-Oct-1970 Age: 51 y.o. Gender: male  Primary Care Provider: Ivonne Andrew, NP Consultants: GI Code Status: Full Preferred Emergency Contact:  Contact Information     Name Relation Home Work Mobile   [place],Crystal Sister [phone]         Chief Complaint: anemia  Assessment and Plan: Gene Reed is a 51 y.o. male presenting with anemia suspected to be related to GI bleed . Differential for this patient's presentation of this includes upper vs lower GI bleed (likely multifactorial), chronic NSAID use, medications related to recent stent, variceal bleeding 2/2 alcohol use, malignancy.   * Anemia Hemoglobin 6.8, 6.5 on recheck.  PT/INR within normal limits.  Likely multifactorial, hemodynamically stable at this time and will receive 2 units pRBC given transfusion threshold <8 in the setting of CAD.  Fluid resuscitation not needed at this time.  Complicated in the setting of recent stent placement and dual antiplatelet therapy and further alcohol and NSAID use. -Admit to [facility], attending Dr. Miquel Dunn, progressive unit -GI consulted, appreciate recommendations -Discuss with cardiology regarding DAPT continuation (held in the meantime) -Ceftriaxone 1 g IV given potential variceal bleed -Octreotide and Protonix IV for GI prophylaxis -2 units pRBC, follow-up post H&H -Vital signs per unit  Tooth pain No abscess identified, however tooth is likely painful. -Tylenol 1000 mg IV every 6 hours  Alcohol use No history of withdrawal. -Monitor CIWA's -TOC for counseling -MVI, folic acid 1 mg, thiamine 100 mg daily  Hyperglycemia Glucose 101, no history of diabetes.  Most recent A1c 4.9 in 2020. -Follow-up A1c  Chronic, stable conditions: HLD: Continue rosuvastatin 40 mg daily Gout: Continue allopurinol 100 mg daily HTN: Continue  Coreg 12.5 mg twice daily  FEN/GI: N.p.o. VTE Prophylaxis: None  Disposition: Progressive  History of Present Illness:  Gene Reed is a 51 y.o. male presenting with anemia likely related to multifactorial etiology.  Patient notes that for the last couple months he has felt very fatigued and has been having dark stools.  He has felt particularly fatigued in the last couple weeks.  He went to his cardiology appointment but they were unable to draw blood.  They were able to draw blood in the past couple days and he was notified by his cardiologist that his hemoglobin was low and he should proceed to the ED.  Further notes that he had an event of passing out 12/14 briefly and fell asleep while getting his hair done earlier this week as well noted by the barber.  He does have recent history of stent placement in early November in which he was initiated on aspirin and Brilinta.  Endorses heavy alcohol use prior to having the stents placed and has attempted to reduce his alcohol intake now from 8-9 beers a day to 2-3 beers a day.  Denies any change in his urine color.  Denies constipation/diarrhea.  Is not in any pain except a left lower tooth that he has yet to see a dentist for.  Patient denies any history of alcohol withdrawal.  And he actively takes all of his medications but was recently discontinued on amlodipine and Zetia by his cardiologist yesterday.  In the ED, patient presented with hemoglobin 6.5.  FOBT positive and GI was consulted for concern of upper versus lower bleed.  Patient was placed on Protonix and octreotide and initiated for  1u pRBC transfusion.  Further notes there is no known history of varices.  Additionally note heavy alcohol use and chronic NSAID use for arthritis pain.  Review Of Systems: Per HPI.  Pertinent Past Medical History: HLD CAD s/p stent Arthritis Gout HTN Remainder reviewed in history tab.   Pertinent Past Surgical History: Knee surgery Coronary stent   Remainder reviewed in history tab.  Pertinent Social History: Tobacco use: No Alcohol use: 2-3 beers/day Other Substance use: Marijuana Lives with his 2 dogs. Lost his wife about a year ago  Pertinent Family History: Heart disease from uncles Stomach cancer in mother?? Remainder reviewed in history tab.   Important Outpatient Medications: Allopurinol 100mg  daily ASA 81mg  daily Carvedilol 12.5 mg twice daily Omeprazole 20 mg daily Rosuvastatin 40 mg daily Brilinta 90 mg twice daily Nitrostat Recently discontinued amlodipine and Zetia by cardiology Remainder reviewed in medication history.   Objective: BP 135/83 (BP Location: Right Arm)   Pulse 81   Temp 98.1 F (36.7 C) (Oral)   Resp 18   SpO2 100%  Exam: General: Awake, alert, NAD, sister at bedside Eyes: EOMI ENTM: Chipped #18 tooth Neck: normal ROM Cardiovascular: RRR, no murmurs auscultated Respiratory: CTAB, normal WOB Gastrointestinal: Soft, distended, periumbilical hernia, normoactive bowel sounds, nontender in all 4 quadrants Derm: Bandage over left index finger with dried blood on the outside Neuro: Moves all 4 extremities appropriately and equally, appropriate in conversation Psych: Normal mood and affect  Labs:  CBC BMET  Recent Labs  Lab 01/22/22 1250  WBC 10.4  HGB 6.8*  HCT 22.2*  PLT 290   Recent Labs  Lab 01/22/22 1250  NA 135  K 4.0  CL 109  CO2 21*  BUN 8  CREATININE 0.72  GLUCOSE 112*  CALCIUM 8.2*     FOBT positive PT/INR 15/1.2 (WNL)  EKG: None  Imaging Studies Performed: None  01/24/22, DO 01/22/2022, 7:43 PM PGY-2, [facility]  FPTS Intern pager: [phone], text pages welcome Secure chat group [facility] Teaching Service

## 2022-01-22 NOTE — Progress Notes (Signed)
I have called and personally spoke with the patient, his renal function and electrolyte is ok. Thyroid function ok. But he is severely anemic. He has been instructed to go to [facility] ED for further evaluation and potential blood transfusion

## 2022-01-23 ENCOUNTER — Ambulatory Visit: Payer: Self-pay | Admitting: Cardiovascular Disease

## 2022-01-23 ENCOUNTER — Encounter (HOSPITAL_COMMUNITY): Payer: Self-pay | Admitting: Student

## 2022-01-23 ENCOUNTER — Observation Stay (HOSPITAL_COMMUNITY): Payer: Self-pay | Admitting: Anesthesiology

## 2022-01-23 ENCOUNTER — Encounter (HOSPITAL_COMMUNITY)
Admission: EM | Disposition: A | Discharge status: Home / Self Care | Payer: Self-pay | Source: Home / Self Care | Attending: Family Medicine

## 2022-01-23 DIAGNOSIS — D62 Acute posthemorrhagic anemia: Secondary | ICD-10-CM

## 2022-01-23 DIAGNOSIS — Z789 Other specified health status: Secondary | ICD-10-CM

## 2022-01-23 DIAGNOSIS — S62635A Displaced fracture of distal phalanx of left ring finger, initial encounter for closed fracture: Secondary | ICD-10-CM | POA: Insufficient documentation

## 2022-01-23 DIAGNOSIS — I10 Essential (primary) hypertension: Secondary | ICD-10-CM

## 2022-01-23 DIAGNOSIS — K922 Gastrointestinal hemorrhage, unspecified: Secondary | ICD-10-CM

## 2022-01-23 DIAGNOSIS — Z87891 Personal history of nicotine dependence: Secondary | ICD-10-CM

## 2022-01-23 DIAGNOSIS — K921 Melena: Secondary | ICD-10-CM

## 2022-01-23 DIAGNOSIS — D649 Anemia, unspecified: Secondary | ICD-10-CM

## 2022-01-23 DIAGNOSIS — I2510 Atherosclerotic heart disease of native coronary artery without angina pectoris: Secondary | ICD-10-CM

## 2022-01-23 DIAGNOSIS — D500 Iron deficiency anemia secondary to blood loss (chronic): Secondary | ICD-10-CM

## 2022-01-23 DIAGNOSIS — K31811 Angiodysplasia of stomach and duodenum with bleeding: Secondary | ICD-10-CM

## 2022-01-23 DIAGNOSIS — D509 Iron deficiency anemia, unspecified: Secondary | ICD-10-CM

## 2022-01-23 HISTORY — PX: HOT HEMOSTASIS: SHX5433

## 2022-01-23 HISTORY — PX: ESOPHAGOGASTRODUODENOSCOPY: SHX5428

## 2022-01-23 LAB — BPAM RBC
Blood Product Expiration Date: 202401102359
Blood Product Expiration Date: 202401172359
ISSUE DATE / TIME: 202312191842
ISSUE DATE / TIME: 202312192206
Unit Type and Rh: 8400
Unit Type and Rh: 8400

## 2022-01-23 LAB — CBC
HCT: 26.2 % — ABNORMAL LOW (ref 39.0–52.0)
HCT: 26.3 % — ABNORMAL LOW (ref 39.0–52.0)
HCT: 27.7 % — ABNORMAL LOW (ref 39.0–52.0)
Hemoglobin: 8.4 g/dL — ABNORMAL LOW (ref 13.0–17.0)
Hemoglobin: 8.6 g/dL — ABNORMAL LOW (ref 13.0–17.0)
Hemoglobin: 8.9 g/dL — ABNORMAL LOW (ref 13.0–17.0)
MCH: 24.9 pg — ABNORMAL LOW (ref 26.0–34.0)
MCH: 25.1 pg — ABNORMAL LOW (ref 26.0–34.0)
MCH: 24.7 pg — ABNORMAL LOW (ref 26.0–34.0)
MCHC: 32.1 g/dL (ref 30.0–36.0)
MCHC: 32.7 g/dL (ref 30.0–36.0)
MCHC: 32.1 g/dL (ref 30.0–36.0)
MCV: 77.5 fL — ABNORMAL LOW (ref 80.0–100.0)
MCV: 76.9 fL — ABNORMAL LOW (ref 80.0–100.0)
MCV: 76.9 fL — ABNORMAL LOW (ref 80.0–100.0)
Platelets: 288 10*3/uL (ref 150–400)
Platelets: 276 10*3/uL (ref 150–400)
Platelets: 282 10*3/uL (ref 150–400)
RBC: 3.38 MIL/uL — ABNORMAL LOW (ref 4.22–5.81)
RBC: 3.42 MIL/uL — ABNORMAL LOW (ref 4.22–5.81)
RBC: 3.6 MIL/uL — ABNORMAL LOW (ref 4.22–5.81)
RDW: 18.9 % — ABNORMAL HIGH (ref 11.5–15.5)
RDW: 18.9 % — ABNORMAL HIGH (ref 11.5–15.5)
RDW: 19.2 % — ABNORMAL HIGH (ref 11.5–15.5)
WBC: 11.8 10*3/uL — ABNORMAL HIGH (ref 4.0–10.5)
WBC: 9.9 10*3/uL (ref 4.0–10.5)
WBC: 10.6 10*3/uL — ABNORMAL HIGH (ref 4.0–10.5)
nRBC: 0 % (ref 0.0–0.2)
nRBC: 0 % (ref 0.0–0.2)
nRBC: 0 % (ref 0.0–0.2)

## 2022-01-23 LAB — TYPE AND SCREEN
ABO/RH(D): AB POS
Antibody Screen: NEGATIVE
Unit division: 0
Unit division: 0

## 2022-01-23 LAB — COMPREHENSIVE METABOLIC PANEL
ALT: 25 U/L (ref 0–44)
AST: 48 U/L — ABNORMAL HIGH (ref 15–41)
Albumin: 3.7 g/dL (ref 3.5–5.0)
Alkaline Phosphatase: 85 U/L (ref 38–126)
Anion gap: 10 (ref 5–15)
BUN: 9 mg/dL (ref 6–20)
CO2: 20 mmol/L — ABNORMAL LOW (ref 22–32)
Calcium: 8.5 mg/dL — ABNORMAL LOW (ref 8.9–10.3)
Chloride: 106 mmol/L (ref 98–111)
Creatinine, Ser: 0.8 mg/dL (ref 0.61–1.24)
GFR, Estimated: >60 mL/min (ref >60)
Glucose, Bld: 78 mg/dL (ref 70–99)
Potassium: 4.1 mmol/L (ref 3.5–5.1)
Sodium: 136 mmol/L (ref 135–145)
Total Bilirubin: 1.8 mg/dL — ABNORMAL HIGH (ref 0.3–1.2)
Total Protein: 7.3 g/dL (ref 6.5–8.1)

## 2022-01-23 LAB — FOLATE: Folate: 17.1 ng/mL (ref >5.9)

## 2022-01-23 LAB — IRON AND TIBC
Iron: 22 ug/dL — ABNORMAL LOW (ref 45–182)
Saturation Ratios: 4 % — ABNORMAL LOW (ref 17.9–39.5)
TIBC: 532 ug/dL — ABNORMAL HIGH (ref 250–450)
UIBC: 510 ug/dL

## 2022-01-23 LAB — FERRITIN: Ferritin: 17 ng/mL — ABNORMAL LOW (ref 24–336)

## 2022-01-23 LAB — VITAMIN B12: Vitamin B-12: 341 pg/mL (ref 180–914)

## 2022-01-23 SURGERY — EGD (ESOPHAGOGASTRODUODENOSCOPY)
Anesthesia: Monitor Anesthesia Care

## 2022-01-23 MED ORDER — DOUBLE ANTIBIOTIC 500-10000 UNIT/GM EX OINT
TOPICAL_OINTMENT | CUTANEOUS | Status: AC
  Filled 2022-01-23: qty 28.4

## 2022-01-23 MED ORDER — PANTOPRAZOLE SODIUM 40 MG PO TBEC: 40.0000 mg | DELAYED_RELEASE_TABLET | Freq: Two times a day (BID) | ORAL | Status: DC

## 2022-01-23 MED ORDER — ASPIRIN 81 MG PO TBEC: 81.0000 mg | DELAYED_RELEASE_TABLET | Freq: Every day | ORAL | Status: DC

## 2022-01-23 MED ORDER — PANTOPRAZOLE SODIUM 40 MG PO TBEC
40.0000 mg | DELAYED_RELEASE_TABLET | Freq: Two times a day (BID) | ORAL | Status: DC
  Administered 2022-01-23 – 2022-01-25 (×5): 40 mg via ORAL
  Filled 2022-01-23 (×5): qty 1

## 2022-01-23 MED ORDER — LACTATED RINGERS IV SOLN
INTRAVENOUS | Status: AC | PRN
  Administered 2022-01-23: 20 mL/h via INTRAVENOUS

## 2022-01-23 MED ORDER — CARVEDILOL 12.5 MG PO TABS
12.5000 mg | ORAL_TABLET | Freq: Two times a day (BID) | ORAL | Status: DC
  Administered 2022-01-23 – 2022-01-25 (×4): 12.5 mg via ORAL
  Filled 2022-01-23 (×4): qty 1

## 2022-01-23 MED ORDER — LIDOCAINE 2% (20 MG/ML) 5 ML SYRINGE
INTRAMUSCULAR | Status: DC | PRN
  Administered 2022-01-23: 30 mg via INTRAVENOUS

## 2022-01-23 MED ORDER — PROPOFOL 500 MG/50ML IV EMUL
INTRAVENOUS | Status: DC | PRN
  Administered 2022-01-23: 125 ug/kg/min via INTRAVENOUS

## 2022-01-23 MED ORDER — CARVEDILOL 3.125 MG PO TABS: 6.2500 mg | ORAL_TABLET | Freq: Two times a day (BID) | ORAL | Status: DC

## 2022-01-23 MED ORDER — TICAGRELOR 90 MG PO TABS
90.0000 mg | ORAL_TABLET | Freq: Two times a day (BID) | ORAL | Status: DC
  Administered 2022-01-24 – 2022-01-25 (×3): 90 mg via ORAL
  Filled 2022-01-23 (×3): qty 1

## 2022-01-23 MED ORDER — ACETAMINOPHEN 325 MG PO TABS
650.0000 mg | ORAL_TABLET | Freq: Four times a day (QID) | ORAL | Status: DC | PRN
  Administered 2022-01-24: 650 mg via ORAL
  Filled 2022-01-23: qty 2

## 2022-01-23 MED ORDER — ASPIRIN 81 MG PO TBEC
81.0000 mg | DELAYED_RELEASE_TABLET | Freq: Every day | ORAL | Status: DC
  Administered 2022-01-24 – 2022-01-25 (×2): 81 mg via ORAL
  Filled 2022-01-23 (×2): qty 1

## 2022-01-23 MED ORDER — PROPOFOL 10 MG/ML IV BOLUS
INTRAVENOUS | Status: DC | PRN
  Administered 2022-01-23 (×2): 20 ug via INTRAVENOUS
  Administered 2022-01-23: 30 ug via INTRAVENOUS

## 2022-01-23 NOTE — Progress Notes (Incomplete)
Rounding Note    Patient Name: Gene Reed Date of Encounter: 01/23/2022  [facility] Cardiologist: Nicki Guadalajara, MD   Subjective   ***  Inpatient Medications    Scheduled Meds:  allopurinol  100 mg Oral Daily   [START ON 01/24/2022] aspirin EC  81 mg Oral Daily   carvedilol  12.5 mg Oral BID   folic acid  1 mg Oral Daily   multivitamin with minerals  1 tablet Oral Daily   pantoprazole  40 mg Oral BID   rosuvastatin  40 mg Oral Daily   thiamine  100 mg Oral Daily   Or   thiamine  100 mg Intravenous Daily   [START ON 01/24/2022] ticagrelor  90 mg Oral BID   Continuous Infusions:   PRN Meds:    Vital Signs    Vitals:   01/23/22 1515 01/23/22 1516 01/23/22 1530 01/23/22 1545  BP: 114/64  117/61   Pulse: 82  84 83  Resp: 16  18 19   Temp:  (!) 97 F (36.1 C)    TempSrc:      SpO2: 98%  100% 100%    Intake/Output Summary (Last 24 hours) at 01/23/2022 1726 Last data filed at 01/23/2022 0757 Gross per 24 hour  Intake 874.2 ml  Output 900 ml  Net -25.8 ml       01/21/2022    8:14 AM 01/07/2022    4:23 PM 12/19/2021    1:32 PM  Last 3 Weights  Weight (lbs) 255 lb 9.6 oz 255 lb 256 lb  Weight (kg) 115.939 kg 115.667 kg 116.121 kg      Telemetry    NSR - Personally Reviewed  ECG    NSR with RBBB - Personally Reviewed  Physical Exam   GEN: No acute distress.   Neck: No JVD Cardiac: RRR, no murmurs, rubs, or gallops.  Respiratory: Clear to auscultation bilaterally. GI: Soft, nontender, non-distended  MS: No edema; No deformity. Neuro:  Nonfocal  Psych: Normal affect   Labs    High Sensitivity Troponin:  No results for input(s): "TROPONINIHS" in the last 720 hours.   Chemistry Recent Labs  Lab 01/21/22 0948 01/22/22 1250 01/23/22 0328  NA 135 135 136  K 4.1 4.0 4.1  CL 104 109 106  CO2 21 21* 20*  GLUCOSE 101* 112* 78  BUN 11 8 9   CREATININE 0.84 0.72 0.80  CALCIUM 8.4* 8.2* 8.5*  PROT  --  7.3 7.3  ALBUMIN  --   3.6 3.7  AST  --  40 48*  ALT  --  23 25  ALKPHOS  --  90 85  BILITOT  --  1.0 1.8*  GFRNONAA  --  >60 >60  ANIONGAP  --  5 10     Lipids No results for input(s): "CHOL", "TRIG", "HDL", "LABVLDL", "LDLCALC", "CHOLHDL" in the last 168 hours.  Hematology Recent Labs  Lab 01/23/22 0328 01/23/22 1356 01/23/22 1607  WBC 11.8* 9.9 10.6*  RBC 3.38* 3.42* 3.60*  HGB 8.4* 8.6* 8.9*  HCT 26.2* 26.3* 27.7*  MCV 77.5* 76.9* 76.9*  MCH 24.9* 25.1* 24.7*  MCHC 32.1 32.7 32.1  RDW 18.9* 18.9* 19.2*  PLT 288 276 282    Thyroid  Recent Labs  Lab 01/21/22 0948  TSH 2.320     BNPNo results for input(s): "BNP", "PROBNP" in the last 168 hours.  DDimer No results for input(s): "DDIMER" in the last 168 hours.   Radiology    DG  Finger Ring Left  Result Date: 01/22/2022 CLINICAL DATA:  Status post trauma to the fourth left finger. EXAM: LEFT RING FINGER 2+V COMPARISON:  None Available. FINDINGS: There is an acute, very mildly displaced fracture deformity extending through the tuft of the distal phalanx of the fourth left finger. There is no evidence of dislocation. There is no evidence of arthropathy or other focal bone abnormality. Mild soft tissue swelling is noted. IMPRESSION: Acute fracture of the distal phalanx of the fourth left finger. Electronically Signed   By: Aram Candela M.D.   On: 01/22/2022 23:31    Cardiac Studies   LHC 12/06/21:   Prox RCA lesion is 90% stenosed.   Prox RCA to Mid RCA lesion is 20% stenosed.   Mid LM to Dist LM lesion is 15% stenosed.   Prox LAD to Mid LAD lesion is 40% stenosed.   Mid LAD-1 lesion is 75% stenosed.   Mid LAD-2 lesion is 40% stenosed.   Prox Cx lesion is 20% stenosed.   2nd Mrg lesion is 20% stenosed.   A drug-eluting stent was successfully placed.   Post intervention, there is a 0% residual stenosis.   Multivessel CAD with severely calcified proximal RCA stenosis of 90% followed by mid 20% stenosis in a dominant RCA which supplies  the LV apex.   Diffusely calcified proximal LAD stenoses of 40% 75% and 40%.   Mild nonobstructive disease in the distal left main and left circumflex vessel.   LVEDP 21 mmHg.   Difficult but successful PCI to the severely calcified proximal RCA with PTCA, scoring balloon, and intravascular lithotripsy and ultimate insertion of a 3.5 x 20 mm Synergy DES stent postdilated to 3.75 mm with the stenosis being reduced to 0% and successful lithotripsy with resolution of previous significant calcification.   RECOMMENDATION: DAPT for minimum of 12 months probably longer.  Increase medical therapy for concomitant LAD disease.  If patient has recurrent symptomatology will need staged PCI to LAD.  TTE 09/28/21: IMPRESSIONS     1. Left ventricular ejection fraction, by estimation, is 65 to 70%. The  left ventricle has normal function. The left ventricle has no regional  wall motion abnormalities. There is mild left ventricular hypertrophy.  Left ventricular diastolic parameters  were normal.   2. Right ventricular systolic function is normal. The right ventricular  size is normal.   3. The mitral valve is normal in structure. Mild mitral valve  regurgitation.   4. The aortic valve is tricuspid. Aortic valve regurgitation is not  visualized. Aortic valve sclerosis/calcification is present, without any  evidence of aortic stenosis.   5. The inferior vena cava is normal in size with greater than 50%  respiratory variability, suggesting right atrial pressure of 3 mmHg.   Patient Profile     51 y.o. male with history of CAD s/p PCI to RCA in 12/2021, HTN, HLD, and adrenal nodule on CTA who initially presented to cardiology clinic with episode of syncope thought to be due to orthostasis/vasovagal found to be severely anemic prompting referral to the ER for further management. Cardiology is consulted for management of DAPT in the setting of GIB.  Assessment & Plan    #Iron Deficiency  Anemia: #GIB: Patient presented to Cardiology clinic with syncope found to be orthostatic with labs revealing significant anemia with hemoglobin 6.5 from 11 on 12/12/21 concerning for acute GIB in the setting of recent initiation of DAPT. Received 2u of pRBC overnight with appropriate response. DAPT has temporarily been  held. EGD today with small angioectasia treated with APC -Plan to resume ASA 81mg  tomorrow; will ask about timing of brilinta -Continue PPI BID -Continue to trend H/H  #CAD s/p RCA PCI: Patient with recent PCI to RCA in 12/2021. Has residual 75% LAD disease that has been managed medically. DAPT currently being held temporarily given GIB as detailed above. Will resume ASA tomorrow and discuss timing for brilinta. -Plan to resume ASA 81mg  daily tomorrow; will discuss timing of brilinta as above -Continue crestor 40mg  daily -Continue coreg 12.5mg  BID  #HLD: -Continue crestor 40mg  daily      For questions or updates, please contact [facility] Please consult [email or web address] for contact info under        Signed, 01/2022, MD  01/23/2022, 5:26 PM

## 2022-01-23 NOTE — Op Note (Signed)
[facility] Patient Name: Gene Reed Procedure Date : 01/23/2022 MRN: [id number] Attending MD: Doristine Locks , MD, [id number] Date of Birth: Nov 17, 1970 CSN: [id number] Age: 51 Admit Type: Inpatient Procedure:                Enteroscopy with control of bleeding. Indications:              Iron deficiency anemia secondary to chronic blood                            loss, Melena                           51 y.o. male with a history of CAD with recent                            cardiac catheterization 12/06/2021 with PCI to RCA                            with DES, now on ASA/Brilinta, presents with                            symptomatic, microcytic anemia and dark stools. Providers:                Doristine Locks, MD, Joannie [place], Technician Referring MD:              Medicines:                Monitored Anesthesia Care Complications:            No immediate complications. Estimated Blood Loss:     Estimated blood loss: none. Procedure:                Pre-Anesthesia Assessment:                           - Prior to the procedure, a History and Physical                            was performed, and patient medications and                            allergies were reviewed. The patient's tolerance of                            previous anesthesia was also reviewed. The risks                            and benefits of the procedure and the sedation                            options and risks were discussed with the patient.                            All questions were answered, and informed consent  was obtained. Prior Anticoagulants: The patient has                            taken Brilinta (ticagrelor), last dose was 1 day                            prior to procedure. ASA Grade Assessment: II - A                            patient with mild systemic disease. After reviewing                            the risks and benefits, the patient was  deemed in                            satisfactory condition to undergo the procedure.                           After obtaining informed consent, the endoscope was                            passed under direct vision. Throughout the                            procedure, the patient's blood pressure, pulse, and                            oxygen saturations were monitored continuously. The                            [id number]-[id number] ([id number]) Olympus endoscope was introduced                            through the mouth, and advanced to the fourth part                            of duodenum. The upper GI endoscopy was                            accomplished without difficulty. The patient                            tolerated the procedure well. Scope In: Scope Out: Findings:      The examined esophagus was normal.      The entire examined stomach was normal.      There was a small amount of fresh blood in the 1st portion of the       duodenum. This was lavaged clear. A single small angioectasia was found       in the first portion of the duodenum. Coagulation for hemostasis using       argon plasma was successful. Estimated blood loss: none.      Given the presence of the small AVM, the endoscope was removed and       exchanged for a  pediatric colonoscope for deeper small bowel       interrogation. The duodenal bulb, second portion of the duodenum, third       portion of the duodenum and fourth portion of the duodenum were all       otherwise normal. No other stigmata of bleeding. Impression:               - Normal esophagus.                           - Normal stomach.                           - A single angioectasia in the 1st portion of the                            duodenum (C-sweep). Treated with argon plasma                            coagulation (APC).                           - Normal duodenal bulb, second portion of the                            duodenum, third portion of the  duodenum and fourth                            portion of the duodenum.                           - No specimens collected. Recommendation:           - Return patient to hospital ward for ongoing care.                           - Start full liquids now in recovery, then can                            advance diet as tolerated later today.                           - Use Protonix (pantoprazole) 40 mg PO BID for 4                            weeks, then reduce to 40 mg daily, and if no                            further need for acid suppression therapy, can                            discontinue.                           - Continue serial CBC checks with additional blood  products as needed per protocol.                           - Check iron panel, B12, folate.                           - Resume ASA 81 mg daily tomorrow.                           - Since no gastritis or peptic ulcer disease,                            should be reasonable to restart Brilinta in the                            next few days as the benefits of DAPT outweigh the                            risks given the recency of his cardiac                            catheterization and PCI with DES. Will ultimately                            defer long-term anticoagulation/antiplatelet                            therapy to the Cardiology service.                           - If concern for rebleeding, may elect for Video                            Capsule Endoscopy for further small bowel                            interrogation and evaluation for additional AVMs in                            the mid to distal small bowel.                           - Will eventually need outpatient colonoscopy for                            routine CRC screening. Outside of emergent need,                            routine screening can be performed in 6+ months                            when able to hold  Brilinta for elective procedure. Procedure Code(s):        --- Professional ---  43255, Esophagogastroduodenoscopy, flexible,                            transoral; with control of bleeding, any method Diagnosis Code(s):        --- Professional ---                           K31.819, Angiodysplasia of stomach and duodenum                            without bleeding                           D50.0, Iron deficiency anemia secondary to blood                            loss (chronic)                           K92.1, Melena (includes Hematochezia) CPT copyright 2022 [employer]. All rights reserved. The codes documented in this report are preliminary and upon coder review may  be revised to meet current compliance requirements. Doristine Locks, MD 01/23/2022 10:53:10 AM Number of Addenda: 0

## 2022-01-23 NOTE — ED Notes (Signed)
Patient returned from Endoscopy

## 2022-01-23 NOTE — Consult Note (Signed)
Consultation  Referring Provider:   Ezequiel Essex, MD Primary Care Physician:  Fenton Foy, NP Primary Gastroenterologist:     Althia Forts    Reason for Consultation:     Melena, symptomatic microcytic anemia, acute blood loss anemia         HPI:   Gene Reed is a 51 y.o. male with a history of CAD with recent cardiac catheterization 12/06/2021 with PCI to RCA with DES, now on ASA/Brilinta, along with history of HTN, HLD, GERD, gout, OSA (on CPAP), presents with symptomatic anemia.  He was seen in the Cardiology Clinic on 12/18 for evaluation of syncope, dizziness and evaluation before anemia with H/H 6.5/21.6, down from 11/31 on 12/12/2021.  He does report having dark stools over the last 6 weeks or so, starting shortly after recent cardiac catheterization.  Mild generalized abdominal discomfort, but not frank pain.  Occasional nausea, but no emesis.  No prior known history of GI bleed.  No previous EGD or colonoscopy.  Does report a history of heavy EtOH, drinking 8-9 beers daily.  More recently has tried to cut down, now drinking 3 beers/day since cardiac catheterization.  Does smoke marijuana-1 joint daily.  No tobacco.  He never met his birth mother, but was told she may have had stomach cancer, but this is very much uncertain.  Otherwise no known family history of GI malignancy, IBD, hepatobiliary or pancreatic disease.  Admission evaluation notable for the following: - H/H 6.8/22.2 with MCV/RDW 76/18.  Was transfused 2 unit PRBC with repeat H/H 8.4/26.2 this morning - PLT 288 - BUN/creatinine 9/0.8 - AST/ALT 48/25, albumin 3.7, T. bili 1.8 (was 1.0 on arrival), ALP 85 - INR 1.2  Baseline CBC from 08/2021 was H/H 12.4/37.6 with MCV/RDW 86/14.  He was started on IV PPI, along with octreotide and Rocephin due to history of heavy EtOH use.  Past Medical History:  Diagnosis Date   Adrenal nodule (HCC)    Arthritis    Bilateral chronic knee pain    Bone spur    CAD  (coronary artery disease)    Gout    Hyperlipidemia    Hypertension     Past Surgical History:  Procedure Laterality Date   CORONARY LITHOTRIPSY N/A 12/06/2021   Procedure: CORONARY LITHOTRIPSY;  Surgeon: Troy Sine, MD;  Location: [facility];  Service: Cardiovascular;  Laterality: N/A;   CORONARY STENT INTERVENTION Right 12/06/2021   Procedure: CORONARY STENT INTERVENTION;  Surgeon: Troy Sine, MD;  Location: [place] [facility];  Service: Cardiovascular;  Laterality: Right;   KNEE SURGERY     LEFT HEART CATH AND CORONARY ANGIOGRAPHY N/A 12/06/2021   Procedure: LEFT HEART CATH AND CORONARY ANGIOGRAPHY;  Surgeon: Troy Sine, MD;  Location: [place] [facility];  Service: Cardiovascular;  Laterality: N/A;    No family history on file.   Social History   Tobacco Use   Smoking status: Former   Smokeless tobacco: Never  Scientific laboratory technician Use: Never used  Substance Use Topics   Alcohol use: Yes    Comment:  6 beers per a day    Drug use: Yes    Types: Marijuana    Comment: 1 joint a day     Prior to Admission medications   Medication Sig Start Date End Date Taking? Authorizing Provider  allopurinol (ZYLOPRIM) 100 MG tablet TAKE 1 TABLET (100 MG TOTAL) BY MOUTH DAILY. Patient taking differently: Take 100 mg by mouth  daily. 08/20/21 08/20/22 Yes Hollis, Lachina M, FNP  aspirin EC 81 MG tablet Take 1 tablet (81 mg total) by mouth daily. Swallow whole. 11/29/21  Yes Goodrich, Callie E, PA-C  carvedilol (COREG) 12.5 MG tablet Take 1 tablet (12.5 mg total) by mouth 2 (two) times daily. 01/07/22  Yes Kelly, Thomas A, MD  nitroGLYCERIN (NITROSTAT) 0.4 MG SL tablet Place 1 tablet (0.4 mg total) under the tongue every 5 (five) minutes as needed for chest pain. 11/29/21 02/27/22 Yes Goodrich, Callie E, PA-C  omeprazole (PRILOSEC) 20 MG capsule TAKE 1 CAPSULE (20 MG TOTAL) BY MOUTH DAILY. Patient taking differently: Take 20 mg by mouth daily. 08/20/21 08/20/22 Yes Hollis,  Lachina M, FNP  rosuvastatin (CRESTOR) 40 MG tablet Take 1 tablet (40 mg total) by mouth daily. 12/07/21  Yes Dunn, Dayna N, PA-C  ticagrelor (BRILINTA) 90 MG TABS tablet Take 1 tablet (90 mg total) by mouth 2 (two) times daily. 12/07/21  Yes Dunn, Dayna N, PA-C  ezetimibe (ZETIA) 10 MG tablet Take 1 tablet (10 mg total) by mouth daily. Patient not taking: Reported on 01/22/2022 01/07/22   Kelly, Thomas A, MD    Current Facility-Administered Medications  Medication Dose Route Frequency Provider Last Rate Last Admin   acetaminophen (OFIRMEV) IV 1,000 mg  1,000 mg Intravenous Q6H Dahbura, Anton, DO   Held at 01/23/22 0353   allopurinol (ZYLOPRIM) tablet 100 mg  100 mg Oral Daily Dahbura, Anton, DO       carvedilol (COREG) tablet 6.25 mg  6.25 mg Oral BID Pemberton, Heather E, MD       folic acid (FOLVITE) tablet 1 mg  1 mg Oral Daily Dahbura, Anton, DO   1 mg at 01/22/22 2123   multivitamin with minerals tablet 1 tablet  1 tablet Oral Daily Dahbura, Anton, DO   1 tablet at 01/22/22 2124   octreotide (SANDOSTATIN) 500 mcg in sodium chloride 0.9 % 250 mL (2 mcg/mL) infusion  50 mcg/hr Intravenous Continuous Dahbura, Anton, DO 25 mL/hr at 01/23/22 0127 50 mcg/hr at 01/23/22 0127   [START ON 01/26/2022] pantoprazole (PROTONIX) injection 40 mg  40 mg Intravenous Q12H Prosperi, Christian H, PA-C       pantoprozole (PROTONIX) 80 mg /NS 100 mL infusion  8 mg/hr Intravenous Continuous Dahbura, Anton, DO 10 mL/hr at 01/23/22 0257 8 mg/hr at 01/23/22 0257   rosuvastatin (CRESTOR) tablet 40 mg  40 mg Oral Daily Dahbura, Anton, DO       thiamine (VITAMIN B1) tablet 100 mg  100 mg Oral Daily Dahbura, Anton, DO   100 mg at 01/22/22 2123   Or   thiamine (VITAMIN B1) injection 100 mg  100 mg Intravenous Daily Dahbura, Anton, DO       Current Outpatient Medications  Medication Sig Dispense Refill   allopurinol (ZYLOPRIM) 100 MG tablet TAKE 1 TABLET (100 MG TOTAL) BY MOUTH DAILY. (Patient taking differently: Take  100 mg by mouth daily.) 90 tablet 3   aspirin EC 81 MG tablet Take 1 tablet (81 mg total) by mouth daily. Swallow whole. 90 tablet 3   carvedilol (COREG) 12.5 MG tablet Take 1 tablet (12.5 mg total) by mouth 2 (two) times daily. 180 tablet 3   nitroGLYCERIN (NITROSTAT) 0.4 MG SL tablet Place 1 tablet (0.4 mg total) under the tongue every 5 (five) minutes as needed for chest pain. 25 tablet 3   omeprazole (PRILOSEC) 20 MG capsule TAKE 1 CAPSULE (20 MG TOTAL) BY MOUTH DAILY. (Patient taking differently: Take   20 mg by mouth daily.) 90 capsule 3   rosuvastatin (CRESTOR) 40 MG tablet Take 1 tablet (40 mg total) by mouth daily. 30 tablet 5   ticagrelor (BRILINTA) 90 MG TABS tablet Take 1 tablet (90 mg total) by mouth 2 (two) times daily. 60 tablet 11   ezetimibe (ZETIA) 10 MG tablet Take 1 tablet (10 mg total) by mouth daily. (Patient not taking: Reported on 01/22/2022) 90 tablet 3    Allergies as of 01/22/2022 - Review Complete 01/22/2022  Allergen Reaction Noted   Poison ivy extract Itching 12/06/2021     Review of Systems:    As per HPI, otherwise negative    Physical Exam:  Vital signs in last 24 hours: Temp:  [97.9 F (36.6 C)-98.8 F (37.1 C)] 97.9 F (36.6 C) (12/20 0611) Pulse Rate:  [67-85] 68 (12/20 0741) Resp:  [11-22] 11 (12/20 0730) BP: (101-140)/(60-92) 121/76 (12/20 0741) SpO2:  [98 %-100 %] 100 % (12/20 0730) Last BM Date : 01/23/22 General:   Pleasant male in NAD Head:  Normocephalic and atraumatic. Eyes:   No icterus.   Conjunctiva pink. Lungs:  Respirations even and unlabored. Lungs clear to auscultation bilaterally.   No wheezes, crackles, or rhonchi.  Heart:  Regular rate and rhythm; no MRG Abdomen:  Soft, nondistended, nontender. Normal bowel sounds. No appreciable masses or hepatomegaly.  Rectal:  Not performed.  Neurologic:  Alert and  oriented x4;  grossly normal neurologically. Skin:  Intact without significant lesions or rashes. Psych:  Alert and  cooperative. Normal affect.  LAB RESULTS: Recent Labs    01/21/22 0948 01/22/22 1250 01/23/22 0328  WBC 10.5 10.4 11.8*  HGB 6.5* 6.8* 8.4*  HCT 21.6* 22.2* 26.2*  PLT 288 290 288   BMET Recent Labs    01/21/22 0948 01/22/22 1250 01/23/22 0328  NA 135 135 136  K 4.1 4.0 4.1  CL 104 109 106  CO2 21 21* 20*  GLUCOSE 101* 112* 78  BUN _0 CREATININE 0.84 0.72 0.80  CALCIUM 8.4* 8.2* 8.5*   LFT Recent Labs    01/23/22 0328  PROT 7.3  ALBUMIN 3.7  AST 48*  ALT 25  ALKPHOS 85  BILITOT 1.8*   PT/INR Recent Labs    01/22/22 1250  LABPROT 15.0  INR 1.2    STUDIES: DG Finger Ring Left  Result Date: 01/22/2022 CLINICAL DATA:  Status post trauma to the fourth left finger. EXAM: LEFT RING FINGER 2+V COMPARISON:  None Available. FINDINGS: There is an acute, very mildly displaced fracture deformity extending through the tuft of the distal phalanx of the fourth left finger. There is no evidence of dislocation. There is no evidence of arthropathy or other focal bone abnormality. Mild soft tissue swelling is noted. IMPRESSION: Acute fracture of the distal phalanx of the fourth left finger. Electronically Signed   By: Virgina Norfolk M.D.   On: 01/22/2022 23:31     PREVIOUS ENDOSCOPIES:            None   Impression / Plan:   1) Symptomatic microcytic anemia 2) Melena - Plan for expedited EGD with MAC this morning for diagnostic and potentially therapeutic intent.  Did discuss the possibility of limited therapeutic capabilities due to DAPT, but given recency of PCI, feel that the benefits far outweigh the risks of EGD now rather than awaiting ASA/Brilinta washout - Continue IV PPI - Reasonable to have started octreotide and Rocephin due to EtOH use.  Will make decision  on whether or not to continue based on endoscopic findings - Holding ASA/Brilinta for now.  Per Cardiology service, can hopefully resume ASA in short order, with a goal to resume DAPT as soon as  possible - Check iron panel (understanding this may be somewhat inaccurate read after blood transfusion), B12, folate  3) CAD with DES to RCA 4) DAPT - As above, held ASA/Brilinta on admission.  Again, feel that it is prudent to proceed with EGD now instead of waiting for ASA/Brilinta washout - Management per Cardiology service  5) EtOH use disorder - CIWA protocol - Outpatient labs otherwise without e/o impaired hepatic synthetic function - Started on thiamine, folic acid, MVI on admission  The indications, risks, and benefits of EGD were explained to the patient in detail. Risks include but are not limited to bleeding, perforation, adverse reaction to medications, and cardiopulmonary compromise. Sequelae include but are not limited to the possibility of surgery, hospitalization, and mortality. The patient verbalized understanding and wished to proceed.     LOS: 0 days   Lavena Bullion  01/23/2022, 8:43 AM

## 2022-01-23 NOTE — Hospital Course (Addendum)
Gene Reed is a 51 y.o.male with a history of CAD s/p PCI on DAPT, HTN, HLD, gout, alcohol use, GERD, OSA, adrenal nodule who was admitted to the [facility] Medicine Teaching Service at [facility] for anemia 2/2 upper GI bleed. His hospital course is detailed below:  Anemia 2/2 upper GI bleed Presented with hemoglobin 6.8, received 2u pRBC. DAPT held. Received ceftriaxone for concern of variceal bleed. Started on octreotide and Protonix IV for GI prophylaxis.  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 Aspirin and Brilinta restarted the next day. Received IV iron transfusions x2. Hemoglobin consistent >8.0 after starting back DAPT.  Displaced fracture of distal phalanx of left ring finger Injury occurred on 12/16.  X-ray confirmed mild displacement of distal phalanx fracture.  Splinted in the ED and hand surgery recommended follow-up outpatient.  Other chronic conditions were medically managed with home medications and formulary alternatives as necessary (HLD, HTN, OSA, gout)  PCP Follow-up Recommendations:  Hand surgery outpatient f/u for L 4th finger fx Outpatient GI colonoscopy (ideally > 6 months from stent) Consider naltrexone given alcohol use Emphasized dentistry follow-up for #16 tooth pain

## 2022-01-23 NOTE — Anesthesia Postprocedure Evaluation (Signed)
Anesthesia Post Note  Patient: Sherry Ruffing  Procedure(s) Performed: ESOPHAGOGASTRODUODENOSCOPY (EGD) (Left) HOT HEMOSTASIS (ARGON PLASMA COAGULATION/BICAP)     Patient location during evaluation: PACU Anesthesia Type: MAC Level of consciousness: awake and alert Pain management: pain level controlled Vital Signs Assessment: post-procedure vital signs reviewed and stable Respiratory status: spontaneous breathing, nonlabored ventilation, respiratory function stable and patient connected to nasal cannula oxygen Cardiovascular status: stable and blood pressure returned to baseline Postop Assessment: no apparent nausea or vomiting Anesthetic complications: no   No notable events documented.  Last Vitals:  Vitals:   01/23/22 1105 01/23/22 1130  BP: 139/79   Pulse: 77   Resp: 15   Temp:  36.9 C  SpO2: 98%     Last Pain:  Vitals:   01/23/22 1212  TempSrc:   PainSc: 0-No pain                 Effie Berkshire

## 2022-01-23 NOTE — H&P (View-Only) (Signed)
Consultation  Referring Provider:   Ezequiel Essex, MD Primary Care Physician:  Fenton Foy, NP Primary Gastroenterologist:     Althia Forts    Reason for Consultation:     Melena, symptomatic microcytic anemia, acute blood loss anemia         HPI:   Gene Reed is a 50 y.o. male with a history of CAD with recent cardiac catheterization 12/06/2021 with PCI to RCA with DES, now on ASA/Brilinta, along with history of HTN, HLD, GERD, gout, OSA (on CPAP), presents with symptomatic anemia.  He was seen in the Cardiology Clinic on 12/18 for evaluation of syncope, dizziness and evaluation before anemia with H/H 6.5/21.6, down from 11/31 on 12/12/2021.  He does report having dark stools over the last 6 weeks or so, starting shortly after recent cardiac catheterization.  Mild generalized abdominal discomfort, but not frank pain.  Occasional nausea, but no emesis.  No prior known history of GI bleed.  No previous EGD or colonoscopy.  Does report a history of heavy EtOH, drinking 8-9 beers daily.  More recently has tried to cut down, now drinking 3 beers/day since cardiac catheterization.  Does smoke marijuana-1 joint daily.  No tobacco.  He never met his birth mother, but was told she may have had stomach cancer, but this is very much uncertain.  Otherwise no known family history of GI malignancy, IBD, hepatobiliary or pancreatic disease.  Admission evaluation notable for the following: - H/H 6.8/22.2 with MCV/RDW 76/18.  Was transfused 2 unit PRBC with repeat H/H 8.4/26.2 this morning - PLT 288 - BUN/creatinine 9/0.8 - AST/ALT 48/25, albumin 3.7, T. bili 1.8 (was 1.0 on arrival), ALP 85 - INR 1.2  Baseline CBC from 08/2021 was H/H 12.4/37.6 with MCV/RDW 86/14.  He was started on IV PPI, along with octreotide and Rocephin due to history of heavy EtOH use.  Past Medical History:  Diagnosis Date   Adrenal nodule (HCC)    Arthritis    Bilateral chronic knee pain    Bone spur    CAD  (coronary artery disease)    Gout    Hyperlipidemia    Hypertension     Past Surgical History:  Procedure Laterality Date   CORONARY LITHOTRIPSY N/A 12/06/2021   Procedure: CORONARY LITHOTRIPSY;  Surgeon: Troy Sine, MD;  Location: [place] [facility];  Service: Cardiovascular;  Laterality: N/A;   CORONARY STENT INTERVENTION Right 12/06/2021   Procedure: CORONARY STENT INTERVENTION;  Surgeon: Troy Sine, MD;  Location: [place] [facility];  Service: Cardiovascular;  Laterality: Right;   KNEE SURGERY     LEFT HEART CATH AND CORONARY ANGIOGRAPHY N/A 12/06/2021   Procedure: LEFT HEART CATH AND CORONARY ANGIOGRAPHY;  Surgeon: Troy Sine, MD;  Location: [place] [facility];  Service: Cardiovascular;  Laterality: N/A;    No family history on file.   Social History   Tobacco Use   Smoking status: Former   Smokeless tobacco: Never  Scientific laboratory technician Use: Never used  Substance Use Topics   Alcohol use: Yes    Comment:  6 beers per a day    Drug use: Yes    Types: Marijuana    Comment: 1 joint a day     Prior to Admission medications   Medication Sig Start Date End Date Taking? Authorizing Provider  allopurinol (ZYLOPRIM) 100 MG tablet TAKE 1 TABLET (100 MG TOTAL) BY MOUTH DAILY. Patient taking differently: Take 100 mg by mouth  daily. 08/20/21 08/20/22 Yes Dorena Dew, FNP  aspirin EC 81 MG tablet Take 1 tablet (81 mg total) by mouth daily. Swallow whole. 11/29/21  Yes Sande Rives E, PA-C  carvedilol (COREG) 12.5 MG tablet Take 1 tablet (12.5 mg total) by mouth 2 (two) times daily. 01/07/22  Yes Troy Sine, MD  nitroGLYCERIN (NITROSTAT) 0.4 MG SL tablet Place 1 tablet (0.4 mg total) under the tongue every 5 (five) minutes as needed for chest pain. 11/29/21 02/27/22 Yes Goodrich, Callie E, PA-C  omeprazole (PRILOSEC) 20 MG capsule TAKE 1 CAPSULE (20 MG TOTAL) BY MOUTH DAILY. Patient taking differently: Take 20 mg by mouth daily. 08/20/21 08/20/22 Yes Dorena Dew, FNP  rosuvastatin (CRESTOR) 40 MG tablet Take 1 tablet (40 mg total) by mouth daily. 12/07/21  Yes Dunn, Nedra Hai, PA-C  ticagrelor (BRILINTA) 90 MG TABS tablet Take 1 tablet (90 mg total) by mouth 2 (two) times daily. 12/07/21  Yes Dunn, Dayna N, PA-C  ezetimibe (ZETIA) 10 MG tablet Take 1 tablet (10 mg total) by mouth daily. Patient not taking: Reported on 01/22/2022 01/07/22   Troy Sine, MD    Current Facility-Administered Medications  Medication Dose Route Frequency Provider Last Rate Last Admin   acetaminophen (OFIRMEV) IV 1,000 mg  1,000 mg Intravenous Q6H Wells Guiles, DO   Held at 01/23/22 4097   allopurinol (ZYLOPRIM) tablet 100 mg  100 mg Oral Daily Wells Guiles, DO       carvedilol (COREG) tablet 6.25 mg  6.25 mg Oral BID Freada Bergeron, MD       folic acid (FOLVITE) tablet 1 mg  1 mg Oral Daily Wells Guiles, DO   1 mg at 01/22/22 2123   multivitamin with minerals tablet 1 tablet  1 tablet Oral Daily Wells Guiles, DO   1 tablet at 01/22/22 2124   octreotide (SANDOSTATIN) 500 mcg in sodium chloride 0.9 % 250 mL (2 mcg/mL) infusion  50 mcg/hr Intravenous Continuous Wells Guiles, DO 25 mL/hr at 01/23/22 0127 50 mcg/hr at 01/23/22 0127   [START ON 01/26/2022] pantoprazole (PROTONIX) injection 40 mg  40 mg Intravenous Q12H Prosperi, Christian H, PA-C       pantoprozole (PROTONIX) 80 mg /NS 100 mL infusion  8 mg/hr Intravenous Continuous Wells Guiles, DO 10 mL/hr at 01/23/22 0257 8 mg/hr at 01/23/22 0257   rosuvastatin (CRESTOR) tablet 40 mg  40 mg Oral Daily Wells Guiles, DO       thiamine (VITAMIN B1) tablet 100 mg  100 mg Oral Daily Wells Guiles, DO   100 mg at 01/22/22 2123   Or   thiamine (VITAMIN B1) injection 100 mg  100 mg Intravenous Daily Wells Guiles, DO       Current Outpatient Medications  Medication Sig Dispense Refill   allopurinol (ZYLOPRIM) 100 MG tablet TAKE 1 TABLET (100 MG TOTAL) BY MOUTH DAILY. (Patient taking differently: Take  100 mg by mouth daily.) 90 tablet 3   aspirin EC 81 MG tablet Take 1 tablet (81 mg total) by mouth daily. Swallow whole. 90 tablet 3   carvedilol (COREG) 12.5 MG tablet Take 1 tablet (12.5 mg total) by mouth 2 (two) times daily. 180 tablet 3   nitroGLYCERIN (NITROSTAT) 0.4 MG SL tablet Place 1 tablet (0.4 mg total) under the tongue every 5 (five) minutes as needed for chest pain. 25 tablet 3   omeprazole (PRILOSEC) 20 MG capsule TAKE 1 CAPSULE (20 MG TOTAL) BY MOUTH DAILY. (Patient taking differently: Take  20 mg by mouth daily.) 90 capsule 3   rosuvastatin (CRESTOR) 40 MG tablet Take 1 tablet (40 mg total) by mouth daily. 30 tablet 5   ticagrelor (BRILINTA) 90 MG TABS tablet Take 1 tablet (90 mg total) by mouth 2 (two) times daily. 60 tablet 11   ezetimibe (ZETIA) 10 MG tablet Take 1 tablet (10 mg total) by mouth daily. (Patient not taking: Reported on 01/22/2022) 90 tablet 3    Allergies as of 01/22/2022 - Review Complete 01/22/2022  Allergen Reaction Noted   Poison ivy extract Itching 12/06/2021     Review of Systems:    As per HPI, otherwise negative    Physical Exam:  Vital signs in last 24 hours: Temp:  [97.9 F (36.6 C)-98.8 F (37.1 C)] 97.9 F (36.6 C) (12/20 0611) Pulse Rate:  [67-85] 68 (12/20 0741) Resp:  [11-22] 11 (12/20 0730) BP: (101-140)/(60-92) 121/76 (12/20 0741) SpO2:  [98 %-100 %] 100 % (12/20 0730) Last BM Date : 01/23/22 General:   Pleasant male in NAD Head:  Normocephalic and atraumatic. Eyes:   No icterus.   Conjunctiva pink. Lungs:  Respirations even and unlabored. Lungs clear to auscultation bilaterally.   No wheezes, crackles, or rhonchi.  Heart:  Regular rate and rhythm; no MRG Abdomen:  Soft, nondistended, nontender. Normal bowel sounds. No appreciable masses or hepatomegaly.  Rectal:  Not performed.  Neurologic:  Alert and  oriented x4;  grossly normal neurologically. Skin:  Intact without significant lesions or rashes. Psych:  Alert and  cooperative. Normal affect.  LAB RESULTS: Recent Labs    01/21/22 0948 01/22/22 1250 01/23/22 0328  WBC 10.5 10.4 11.8*  HGB 6.5* 6.8* 8.4*  HCT 21.6* 22.2* 26.2*  PLT 288 290 288   BMET Recent Labs    01/21/22 0948 01/22/22 1250 01/23/22 0328  NA 135 135 136  K 4.1 4.0 4.1  CL 104 109 106  CO2 21 21* 20*  GLUCOSE 101* 112* 78  BUN _0 CREATININE 0.84 0.72 0.80  CALCIUM 8.4* 8.2* 8.5*   LFT Recent Labs    01/23/22 0328  PROT 7.3  ALBUMIN 3.7  AST 48*  ALT 25  ALKPHOS 85  BILITOT 1.8*   PT/INR Recent Labs    01/22/22 1250  LABPROT 15.0  INR 1.2    STUDIES: DG Finger Ring Left  Result Date: 01/22/2022 CLINICAL DATA:  Status post trauma to the fourth left finger. EXAM: LEFT RING FINGER 2+V COMPARISON:  None Available. FINDINGS: There is an acute, very mildly displaced fracture deformity extending through the tuft of the distal phalanx of the fourth left finger. There is no evidence of dislocation. There is no evidence of arthropathy or other focal bone abnormality. Mild soft tissue swelling is noted. IMPRESSION: Acute fracture of the distal phalanx of the fourth left finger. Electronically Signed   By: Virgina Norfolk M.D.   On: 01/22/2022 23:31     PREVIOUS ENDOSCOPIES:            None   Impression / Plan:   1) Symptomatic microcytic anemia 2) Melena - Plan for expedited EGD with MAC this morning for diagnostic and potentially therapeutic intent.  Did discuss the possibility of limited therapeutic capabilities due to DAPT, but given recency of PCI, feel that the benefits far outweigh the risks of EGD now rather than awaiting ASA/Brilinta washout - Continue IV PPI - Reasonable to have started octreotide and Rocephin due to EtOH use.  Will make decision  on whether or not to continue based on endoscopic findings - Holding ASA/Brilinta for now.  Per Cardiology service, can hopefully resume ASA in short order, with a goal to resume DAPT as soon as  possible - Check iron panel (understanding this may be somewhat inaccurate read after blood transfusion), B12, folate  3) CAD with DES to RCA 4) DAPT - As above, held ASA/Brilinta on admission.  Again, feel that it is prudent to proceed with EGD now instead of waiting for ASA/Brilinta washout - Management per Cardiology service  5) EtOH use disorder - CIWA protocol - Outpatient labs otherwise without e/o impaired hepatic synthetic function - Started on thiamine, folic acid, MVI on admission  The indications, risks, and benefits of EGD were explained to the patient in detail. Risks include but are not limited to bleeding, perforation, adverse reaction to medications, and cardiopulmonary compromise. Sequelae include but are not limited to the possibility of surgery, hospitalization, and mortality. The patient verbalized understanding and wished to proceed.     LOS: 0 days   Lavena Bullion  01/23/2022, 8:43 AM

## 2022-01-23 NOTE — ED Notes (Signed)
Patient awake and alert this morning, no s/s of distress, no new complaints or concerns other than being hungry, patient repositioned and warm blanket given, urinal emptied and placed back at bedside, belongings within reach, will continue to monitor.

## 2022-01-23 NOTE — Anesthesia Preprocedure Evaluation (Addendum)
Anesthesia Evaluation  Patient identified by MRN, date of birth, ID band Patient awake    Reviewed: Allergy & Precautions, NPO status , Patient's Chart, lab work & pertinent test results  Airway Mallampati: I  TM Distance: >3 FB Neck ROM: Full    Dental  (+) Dental Advisory Given, Teeth Intact   Pulmonary former smoker   breath sounds clear to auscultation       Cardiovascular hypertension, Pt. on home beta blockers + CAD   Rhythm:Regular Rate:Normal     Neuro/Psych negative neurological ROS  negative psych ROS   GI/Hepatic Neg liver ROS,GERD  Medicated,,  Endo/Other  negative endocrine ROS    Renal/GU negative Renal ROS     Musculoskeletal  (+) Arthritis ,    Abdominal   Peds  Hematology negative hematology ROS (+)   Anesthesia Other Findings   Reproductive/Obstetrics                             Anesthesia Physical Anesthesia Plan  ASA: 2  Anesthesia Plan: MAC   Post-op Pain Management: Minimal or no pain anticipated   Induction: Intravenous  PONV Risk Score and Plan: 0 and Propofol infusion  Airway Management Planned: Natural Airway and Simple Face Mask  Additional Equipment: None  Intra-op Plan:   Post-operative Plan:   Informed Consent: I have reviewed the patients History and Physical, chart, labs and discussed the procedure including the risks, benefits and alternatives for the proposed anesthesia with the patient or authorized representative who has indicated his/her understanding and acceptance.     Dental advisory given  Plan Discussed with: CRNA  Anesthesia Plan Comments:        Anesthesia Quick Evaluation

## 2022-01-23 NOTE — ED Notes (Signed)
Patient left in stable condition, AOX4, with staff to go to the OR, he was aware and agreed to this procedure, OR nurse advised they will take EKG there.

## 2022-01-23 NOTE — ED Notes (Signed)
Assisted patient to set up home cpap device in room. RN manages cords to not interfere with IV lines; pt is independent with this otherwise

## 2022-01-23 NOTE — Progress Notes (Addendum)
Daily Progress Note Intern Pager: [phone]  Patient name: Gene Reed Medical record number: [id number] Date of birth: [birth year] Age: 51 y.o. Gender: male  Primary Care Provider: Ivonne Andrew, NP Consultants: GI, Cardiology Code Status: Full code  Pt Overview and Major Events to Date:  12/19: Admitted to FMTS; GI and cardiology consulted  Assessment and Plan: Gene Reed is a 51 y.o. male presenting with anemia suspected to be related to GI bleed/ PMHx includes CAD with recent stent, HTN, HLD, and gout.  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 Threshold <8 given CAD. 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 Per GI, can d/c Octreotide and CTX and continue Protonix. Started on full liquids with plans to advance diet -GI consulted, appreciate recommendations -Protonix 40mg  BID x 4 weeks -Full liquids, to likely advance today -Resume ASA 81mg  and Brilinta  tomorrow  -PM CBC -Anemia work up pending  Displaced fracture of distal phalanx of left ring finger Injury most likely occurred on 12/16. XR confirmed mild displacement of distal phalanx fracture. Splinted in the ED. Hand surgery consulted, agreed patient can follow up outpatient.  Alcohol use CIWAs not recorded - reached out to nursing to obtain. Denies symptoms for withdrawal upon exam. -MVI, folic acid 1 mg, thiamine 100 mg daily  Hyperglycemia Repeat glucose 78. -Follow-up A1c  Tooth pain-resolved as of 01/23/2022 Resolved. Denies concerns.   Chronic, stable conditions: HLD: Continue rosuvastatin 40 mg daily Gout: Continue allopurinol 100 mg daily HTN: Continue Coreg 12.5 mg twice daily OSA: continue CPAP nightly   FEN/GI: Full liquids PPx: SCDs, like to add in PM if CBC stable Dispo: Home pending anemia work up  Subjective:  Assessed at bedside in the ED, states his only concern is getting to eat. Seen prior to  EGD and states he is ready to have it done. Denies any further episodes of melena. Denies any bloody vomit.   In regards to his hand, states he felt it "snatch" when helping his grandchildren on a zip lip. States he immediately had pain. States it feels much better in the splint and is not currently bothering him.  Objective: Temp:  [97.9 F (36.6 C)-98.8 F (37.1 C)] 98.4 F (36.9 C) (12/20 1130) Pulse Rate:  [67-85] 77 (12/20 1105) Resp:  [11-23] 15 (12/20 1105) BP: (101-140)/(60-92) 139/79 (12/20 1105) SpO2:  [97 %-100 %] 98 % (12/20 1105) Physical Exam: General: Well appearing, alert, NAD Cardiovascular: RRR without murmur Respiratory: CTAB. Normal WOB on Ra Abdomen: Soft, obese, non-tender to palpation Extremities: Well perfused. No peripheral edema  Laboratory: Most recent CBC Lab Results  Component Value Date   WBC 11.8 (H) 01/23/2022   HGB 8.4 (L) 01/23/2022   HCT 26.2 (L) 01/23/2022   MCV 77.5 (L) 01/23/2022   PLT 288 01/23/2022   Most recent BMP    Latest Ref Rng & Units 01/23/2022    3:28 AM  BMP  Glucose 70 - 99 mg/dL 78   BUN 6 - 20 mg/dL 9   Creatinine 01/25/2022 - 01/25/2022 mg/dL 0.76   Sodium 8.08 - 8.11 mmol/L 136   Potassium 3.5 - 5.1 mmol/L 4.1   Chloride 98 - 111 mmol/L 106   CO2 22 - 32 mmol/L 20   Calcium 8.9 - 10.3 mg/dL 8.5     Other pertinent labs: AST: 48 ALT: 25 FOBT: positive  Imaging/Diagnostic Tests: DG Finger  Ring Left Result Date: 01/22/2022 IMPRESSION: Acute fracture of the distal phalanx of the fourth left finger.   Elberta Fortis, MD 01/23/2022, 12:29 PM  PGY-1, [facility] FPTS Intern pager: [phone], text pages welcome Secure chat group [facility] Teaching Service

## 2022-01-23 NOTE — ED Notes (Signed)
ED TO INPATIENT HANDOFF REPORT  ED Nurse Name and Phone #: Philip Aspen Name/Age/Gender Gene Reed 51 y.o. male Room/Bed: 001C/001C  Code Status   Code Status: Full Code  Home/SNF/Other Home Patient oriented to: self, place, time, and situation Is this baseline? Yes   Triage Complete: Triage complete  Chief Complaint Anemia [D64.9] Upper GI bleed [K92.2] Symptomatic anemia [D64.9]  Triage Note Pt with symptomatic anemia x 2 months. On brillinta and ASA, having dark tarry stool, syncope, and dizziness. Seen at [facility] yesterday and hemoglobin 6.5.    Allergies Allergies  Allergen Reactions   Poison Ivy Extract Itching    Poison oak    Level of Care/Admitting Diagnosis ED Disposition     ED Disposition  Admit   Condition  --   Comment  Hospital Area: [place] [[id number]]  Level of Care: Progressive [102]  Admit to Progressive based on following criteria: GI, ENDOCRINE disease patients with GI bleeding, acute liver failure or pancreatitis, stable with diabetic ketoacidosis or thyrotoxicosis (hypothyroid) state.  May admit patient to [facility] or [facility] if equivalent level of care is available:: Yes  Covid Evaluation: Asymptomatic - no recent exposure (last 10 days) testing not required  Diagnosis: Anemia [id number]  Admitting Physician: Martyn Malay [id number]  Attending Physician: Martyn Malay [id number]  Certification:: I certify this patient will need inpatient services for at least 2 midnights          B Medical/Surgery History Past Medical History:  Diagnosis Date   Adrenal nodule ([facility] [place])    Arthritis    Bilateral chronic knee pain    Bone spur    CAD (coronary artery disease)    Gout    Hyperlipidemia    Hypertension    Past Surgical History:  Procedure Laterality Date   CORONARY LITHOTRIPSY N/A 12/06/2021   Procedure: CORONARY LITHOTRIPSY;  Surgeon: Troy Sine, MD;  Location: [place] [facility];  Service:  Cardiovascular;  Laterality: N/A;   CORONARY STENT INTERVENTION Right 12/06/2021   Procedure: CORONARY STENT INTERVENTION;  Surgeon: Troy Sine, MD;  Location: [place] [facility];  Service: Cardiovascular;  Laterality: Right;   KNEE SURGERY     LEFT HEART CATH AND CORONARY ANGIOGRAPHY N/A 12/06/2021   Procedure: LEFT HEART CATH AND CORONARY ANGIOGRAPHY;  Surgeon: Troy Sine, MD;  Location: [place] [facility];  Service: Cardiovascular;  Laterality: N/A;     A IV Location/Drains/Wounds Patient Lines/Drains/Airways Status     Active Line/Drains/Airways     Name Placement date Placement time Site Days   Peripheral IV 01/22/22 18 G Posterior;Right Hand 01/22/22  1751  Hand  1   Peripheral IV 01/22/22 20 G Left;Posterior Hand 01/22/22  1752  Hand  1            Intake/Output Last 24 hours  Intake/Output Summary (Last 24 hours) at 01/23/2022 1925 Last data filed at 01/23/2022 0757 Gross per 24 hour  Intake 673.33 ml  Output 900 ml  Net -226.67 ml    Labs/Imaging Results for orders placed or performed during the hospital encounter of 01/22/22 (from the past 48 hour(s))  Comprehensive metabolic panel     Status: Abnormal   Collection Time: 01/22/22 12:50 PM  Result Value Ref Range   Sodium 135 135 - 145 mmol/L   Potassium 4.0 3.5 - 5.1 mmol/L   Chloride 109 98 - 111 mmol/L   CO2 21 (L) 22 - 32 mmol/L  Glucose, Bld 112 (H) 70 - 99 mg/dL    Comment: Glucose reference range applies only to samples taken after fasting for at least 8 hours.   BUN 8 6 - 20 mg/dL   Creatinine, Ser 0.72 0.61 - 1.24 mg/dL   Calcium 8.2 (L) 8.9 - 10.3 mg/dL   Total Protein 7.3 6.5 - 8.1 g/dL   Albumin 3.6 3.5 - 5.0 g/dL   AST 40 15 - 41 U/L   ALT 23 0 - 44 U/L   Alkaline Phosphatase 90 38 - 126 U/L   Total Bilirubin 1.0 0.3 - 1.2 mg/dL   GFR, Estimated >60 >60 mL/min    Comment: (NOTE) Calculated using the CKD-EPI Creatinine Equation (2021)    Anion gap 5 5 - 15    Comment: Performed  at [facility] [address], [place], [place] [address]  Protime-INR     Status: None   Collection Time: 01/22/22 12:50 PM  Result Value Ref Range   Prothrombin Time 15.0 11.4 - 15.2 seconds   INR 1.2 0.8 - 1.2    Comment: (NOTE) INR goal varies based on device and disease states. Performed at [facility], [place] [address], [place], [place] [address]   Type and screen [place]     Status: None   Collection Time: 01/22/22 12:50 PM  Result Value Ref Range   ABO/RH(D) AB POS    Antibody Screen NEG    Sample Expiration 01/25/2022,2359    Unit Number [id number]    Blood Component Type RED CELLS,LR    Unit division 00    Status of Unit ISSUED,FINAL    Transfusion Status OK TO TRANSFUSE    Crossmatch Result      Compatible Performed at [facility], [place] [address], [place],  [address]    Unit Number [id number]    Blood Component Type RED CELLS,LR    Unit division 00    Status of Unit ISSUED,FINAL    Transfusion Status OK TO TRANSFUSE    Crossmatch Result Compatible   CBC with Differential     Status: Abnormal   Collection Time: 01/22/22 12:50 PM  Result Value Ref Range   WBC 10.4 4.0 - 10.5 K/uL   RBC 2.92 (L) 4.22 - 5.81 MIL/uL   Hemoglobin 6.8 (LL) 13.0 - 17.0 g/dL    Comment: REPEATED TO VERIFY Reticulocyte Hemoglobin testing may be clinically indicated, consider ordering this additional test [id number] THIS CRITICAL RESULT HAS VERIFIED AND BEEN CALLED TO C.ROWE RN BY KATHERINE MCCORMICK ON 12 19 2023 AT 1352, AND HAS BEEN READ BACK.     HCT 22.2 (L) 39.0 - 52.0 %   MCV 76.0 (L) 80.0 - 100.0 fL   MCH 23.3 (L) 26.0 - 34.0 pg   MCHC 30.6 30.0 - 36.0 g/dL   RDW 18.0 (H) 11.5 - 15.5 %   Platelets 290 150 - 400 K/uL   nRBC 0.2 0.0 - 0.2 %   Neutrophils Relative % 61 %   Neutro Abs 6.3 1.7 - 7.7 K/uL   Lymphocytes Relative 28 %   Lymphs Abs 3.0 0.7 - 4.0 K/uL   Monocytes Relative 9 %   Monocytes Absolute 0.9 0.1 - 1.0  K/uL   Eosinophils Relative 1 %   Eosinophils Absolute 0.1 0.0 - 0.5 K/uL   Basophils Relative 1 %   Basophils Absolute 0.1 0.0 - 0.1 K/uL   Immature Granulocytes 0 %   Abs Immature  Granulocytes 0.03 0.00 - 0.07 K/uL    Comment: Performed at [facility], [place] [address] [place], [place] [address]  ABO/Rh     Status: None   Collection Time: 01/22/22  1:05 PM  Result Value Ref Range   ABO/RH(D)      AB POS Performed at [facility] [address] [place], [place] [address]   POC occult blood, ED     Status: Abnormal   Collection Time: 01/22/22  5:01 PM  Result Value Ref Range   Fecal Occult Bld POSITIVE (A) NEGATIVE  Prepare RBC (crossmatch)     Status: None   Collection Time: 01/22/22  5:01 PM  Result Value Ref Range   Order Confirmation      ORDER PROCESSED BY BLOOD BANK Performed at [facility], [place] [address], [place], [place] [address]   Prepare RBC (crossmatch)     Status: None   Collection Time: 01/22/22  7:09 PM  Result Value Ref Range   Order Confirmation      ORDER PROCESSED BY BLOOD BANK Performed at [facility], [place] [address], [place], [place] [address]   CBC     Status: Abnormal   Collection Time: 01/23/22  3:28 AM  Result Value Ref Range   WBC 11.8 (H) 4.0 - 10.5 K/uL   RBC 3.38 (L) 4.22 - 5.81 MIL/uL   Hemoglobin 8.4 (L) 13.0 - 17.0 g/dL    Comment: Reticulocyte Hemoglobin testing may be clinically indicated, consider ordering this additional test [id number]    HCT 26.2 (L) 39.0 - 52.0 %   MCV 77.5 (L) 80.0 - 100.0 fL   MCH 24.9 (L) 26.0 - 34.0 pg   MCHC 32.1 30.0 - 36.0 g/dL   RDW 18.9 (H) 11.5 - 15.5 %   Platelets 288 150 - 400 K/uL   nRBC 0.0 0.0 - 0.2 %    Comment: Performed at [facility], [place] [address] [place], [place] [address]  Comprehensive metabolic panel     Status: Abnormal   Collection Time: 01/23/22  3:28 AM  Result Value Ref Range   Sodium 136 135 - 145 mmol/L   Potassium 4.1 3.5 - 5.1 mmol/L    Chloride 106 98 - 111 mmol/L   CO2 20 (L) 22 - 32 mmol/L   Glucose, Bld 78 70 - 99 mg/dL    Comment: Glucose reference range applies only to samples taken after fasting for at least 8 hours.   BUN 9 6 - 20 mg/dL   Creatinine, Ser 0.80 0.61 - 1.24 mg/dL   Calcium 8.5 (L) 8.9 - 10.3 mg/dL   Total Protein 7.3 6.5 - 8.1 g/dL   Albumin 3.7 3.5 - 5.0 g/dL   AST 48 (H) 15 - 41 U/L   ALT 25 0 - 44 U/L   Alkaline Phosphatase 85 38 - 126 U/L   Total Bilirubin 1.8 (H) 0.3 - 1.2 mg/dL   GFR, Estimated >60 >60 mL/min    Comment: (NOTE) Calculated using the CKD-EPI Creatinine Equation (2021)    Anion gap 10 5 - 15    Comment: Performed at [facility], [place] [address] [place], [place] [address]  Vitamin B12     Status: None   Collection Time: 01/23/22 12:30 PM  Result Value Ref Range   Vitamin B-12 341 180 - 914 pg/mL    Comment: (NOTE) This assay is not validated for testing neonatal or myeloproliferative syndrome  specimens for Vitamin B12 levels. Performed at [facility], [address] [place], [place] [address]   CBC     Status: Abnormal   Collection Time: 01/23/22  1:56 PM  Result Value Ref Range   WBC 9.9 4.0 - 10.5 K/uL   RBC 3.42 (L) 4.22 - 5.81 MIL/uL   Hemoglobin 8.6 (L) 13.0 - 17.0 g/dL    Comment: Reticulocyte Hemoglobin testing may be clinically indicated, consider ordering this additional test [id number]    HCT 26.3 (L) 39.0 - 52.0 %   MCV 76.9 (L) 80.0 - 100.0 fL   MCH 25.1 (L) 26.0 - 34.0 pg   MCHC 32.7 30.0 - 36.0 g/dL   RDW 23.7 (H) 62.8 - 31.5 %   Platelets 276 150 - 400 K/uL   nRBC 0.0 0.0 - 0.2 %    Comment: Performed at [facility], [address] [place], [place] [address]  Ferritin     Status: Abnormal   Collection Time: 01/23/22  1:56 PM  Result Value Ref Range   Ferritin 17 (L) 24 - 336 ng/mL    Comment: Performed at [facility], [address] [place], [place] [address]  Iron and TIBC     Status: Abnormal   Collection  Time: 01/23/22  1:56 PM  Result Value Ref Range   Iron 22 (L) 45 - 182 ug/dL   TIBC 062 (H) 694 - 854 ug/dL   Saturation Ratios 4 (L) 17.9 - 39.5 %   UIBC 510 ug/dL    Comment: Performed at [facility], [address], [place], [place] [address]  Folate     Status: None   Collection Time: 01/23/22  4:07 PM  Result Value Ref Range   Folate 17.1 >5.9 ng/mL    Comment: Performed at [facility], [address] [place], [place] [address]  CBC     Status: Abnormal   Collection Time: 01/23/22  4:07 PM  Result Value Ref Range   WBC 10.6 (H) 4.0 - 10.5 K/uL   RBC 3.60 (L) 4.22 - 5.81 MIL/uL   Hemoglobin 8.9 (L) 13.0 - 17.0 g/dL    Comment: Reticulocyte Hemoglobin testing may be clinically indicated, consider ordering this additional test [id number]    HCT 27.7 (L) 39.0 - 52.0 %   MCV 76.9 (L) 80.0 - 100.0 fL   MCH 24.7 (L) 26.0 - 34.0 pg   MCHC 32.1 30.0 - 36.0 g/dL   RDW 16.9 (H) 67.8 - 93.8 %   Platelets 282 150 - 400 K/uL   nRBC 0.0 0.0 - 0.2 %    Comment: Performed at [facility], [address] [place], [place] [address]   DG Finger Ring Left  Result Date: 01/22/2022 CLINICAL DATA:  Status post trauma to the fourth left finger. EXAM: LEFT RING FINGER 2+V COMPARISON:  None Available. FINDINGS: There is an acute, very mildly displaced fracture deformity extending through the tuft of the distal phalanx of the fourth left finger. There is no evidence of dislocation. There is no evidence of arthropathy or other focal bone abnormality. Mild soft tissue swelling is noted. IMPRESSION: Acute fracture of the distal phalanx of the fourth left finger. Electronically Signed   By: Aram Candela M.D.   On: 01/22/2022 23:31    Pending Labs Unresulted Labs (From admission, onward)     Start     Ordered   01/24/22 0500  CBC  Tomorrow morning,  R        01/23/22 1736   01/23/22 0500  Hemoglobin A1c  Tomorrow morning,   R        01/22/22 1903   01/22/22 1847  HIV  Antibody (routine testing w rflx)  (HIV Antibody (Routine testing w reflex) panel)  Once,   R        01/22/22 1903            Vitals/Pain Today's Vitals   01/23/22 1515 01/23/22 1516 01/23/22 1530 01/23/22 1545  BP: 114/64  117/61   Pulse: 82  84 83  Resp: 16  18 19   Temp:  (!) 97 F (36.1 C)    TempSrc:      SpO2: 98%  100% 100%  PainSc:        Isolation Precautions No active isolations  Medications Medications  allopurinol (ZYLOPRIM) tablet 100 mg ( Oral MAR Unhold 01/23/22 1107)  rosuvastatin (CRESTOR) tablet 40 mg ( Oral MAR Unhold 01/23/22 1107)  thiamine (VITAMIN B1) tablet 100 mg ( Oral MAR Unhold 01/23/22 1107)    Or  thiamine (VITAMIN B1) injection 100 mg ( Intravenous MAR Unhold [id number])  folic acid (FOLVITE) tablet 1 mg ( Oral MAR Unhold 01/23/22 1107)  multivitamin with minerals tablet 1 tablet ( Oral MAR Unhold 01/23/22 1107)  acetaminophen (TYLENOL) tablet 650 mg (has no administration in time range)  pantoprazole (PROTONIX) EC tablet 40 mg (40 mg Oral Given 01/23/22 1208)  aspirin EC tablet 81 mg (has no administration in time range)  carvedilol (COREG) tablet 12.5 mg (has no administration in time range)  ticagrelor (BRILINTA) tablet 90 mg (has no administration in time range)  0.9 %  sodium chloride infusion (Manually program via Guardrails IV Fluids) (0 mLs Intravenous Stopped 01/22/22 1753)  pantoprazole (PROTONIX) 80 mg /NS 100 mL IVPB (0 mg Intravenous Stopped 01/22/22 1823)  octreotide (SANDOSTATIN) 2 mcg/mL load via infusion 50 mcg (50 mcg Intravenous Bolus from Bag 01/23/22 0127)  cefTRIAXone (ROCEPHIN) 1 g in sodium chloride 0.9 % 100 mL IVPB (0 g Intravenous Stopped 01/22/22 1913)  0.9 %  sodium chloride infusion (Manually program via Guardrails IV Fluids) (0 mLs Intravenous Stopped 01/23/22 1154)  lactated ringers infusion (0 mLs  Stopped 01/23/22 1154)  polymixin-bacitracin (POLYSPORIN) ointment ( Topical Given 01/23/22 1516)     Mobility walks Low fall risk   Focused Assessments     R Recommendations: See Admitting Provider Note  Report given to:   Additional Notes:

## 2022-01-23 NOTE — Anesthesia Procedure Notes (Signed)
Procedure Name: MAC Date/Time: 01/23/2022 9:59 AM  Performed by: Eligha Bridegroom, CRNAPre-anesthesia Checklist: Patient identified, Emergency Drugs available, Suction available, Patient being monitored and Timeout performed Patient Re-evaluated:Patient Re-evaluated prior to induction Oxygen Delivery Method: Nasal cannula Preoxygenation: Pre-oxygenation with 100% oxygen Induction Type: IV induction

## 2022-01-23 NOTE — Transfer of Care (Signed)
Immediate Anesthesia Transfer of Care Note  Patient: Gene Reed  Procedure(s) Performed: ESOPHAGOGASTRODUODENOSCOPY (EGD) (Left) HOT HEMOSTASIS (ARGON PLASMA COAGULATION/BICAP)  Patient Location: PACU and Endoscopy Unit  Anesthesia Type:General  Level of Consciousness: awake, alert , and oriented  Airway & Oxygen Therapy: Patient Spontanous Breathing  Post-op Assessment: Report given to RN and Post -op Vital signs reviewed and stable  Post vital signs: Reviewed and stable  Last Vitals:  Vitals Value Taken Time  BP 126/81 01/23/22 1047  Temp    Pulse 79 01/23/22 1049  Resp 13 01/23/22 1049  SpO2 100 % 01/23/22 1049  Vitals shown include unvalidated device data.  Last Pain:  Vitals:   01/23/22 0925  TempSrc: Temporal  PainSc: 0-No pain         Complications: No notable events documented.

## 2022-01-23 NOTE — Consult Note (Addendum)
WOC Nurse Consult Note: Reason for Consult: Consult requested for left ring finger.  According to progress notes "left ring finger with bloody nail injury, nail appears still fully attached, lateral lacerations oozing blood." This location already has a splint applied with gauze wrap. Adding order for bedside nurses to perform Polysporin apply to laceration left finger q day and replace guaze and splint. Please re-consult if further assistance is needed.  Thank-you,  Cammie Mcgee MSN, RN, CWOCN, [place], CNS [phone]

## 2022-01-23 NOTE — Assessment & Plan Note (Addendum)
Plan to f/u with hand surgery outpatient

## 2022-01-23 NOTE — Interval H&P Note (Signed)
History and Physical Interval Note:  01/23/2022 9:39 AM  Gene Reed  has presented today for surgery, with the diagnosis of Acute blood loss anemia, melena, symptomatic anemia.  The various methods of treatment have been discussed with the patient and family. After consideration of risks, benefits and other options for treatment, the patient has consented to  Procedure(s): ESOPHAGOGASTRODUODENOSCOPY (EGD) (Left) as a surgical intervention.  The patient's history has been reviewed, patient examined, no change in status, stable for surgery.  I have reviewed the patient's chart and labs.  Questions were answered to the patient's satisfaction.     [name]

## 2022-01-23 NOTE — Progress Notes (Signed)
Rounding Note    Patient Name: Gene Reed Date of Encounter: 01/23/2022  [facility] Cardiologist: Nicki Guadalajara, MD   Subjective   Underwent EGD today showed small angioectasia treated with APC.   Currently doing well. Tolerating PO.   Inpatient Medications    Scheduled Meds:  allopurinol  100 mg Oral Daily   carvedilol  12.5 mg Oral BID   folic acid  1 mg Oral Daily   multivitamin with minerals  1 tablet Oral Daily   [START ON 01/26/2022] pantoprazole  40 mg Intravenous Q12H   rosuvastatin  40 mg Oral Daily   thiamine  100 mg Oral Daily   Or   thiamine  100 mg Intravenous Daily   Continuous Infusions:  acetaminophen Stopped (01/23/22 0353)   octreotide (SANDOSTATIN) 500 mcg in sodium chloride 0.9 % 250 mL (2 mcg/mL) infusion 50 mcg/hr (01/23/22 0127)   pantoprazole 8 mg/hr (01/23/22 0257)   PRN Meds:    Vital Signs    Vitals:   01/23/22 0700 01/23/22 0715 01/23/22 0730 01/23/22 0741  BP: 127/74 123/79 121/76 121/76  Pulse: 74 67 68 68  Resp:  14 11   Temp:      TempSrc:      SpO2: 98% 100% 100%     Intake/Output Summary (Last 24 hours) at 01/23/2022 0808 Last data filed at 01/23/2022 0757 Gross per 24 hour  Intake 874.2 ml  Output 900 ml  Net -25.8 ml      01/21/2022    8:14 AM 01/07/2022    4:23 PM 12/19/2021    1:32 PM  Last 3 Weights  Weight (lbs) 255 lb 9.6 oz 255 lb 256 lb  Weight (kg) 115.939 kg 115.667 kg 116.121 kg      Telemetry    NSR - Personally Reviewed  ECG    NSR with RBBB - Personally Reviewed  Physical Exam   GEN: No acute distress.   Neck: No JVD Cardiac: RRR, no murmurs, rubs, or gallops.  Respiratory: Clear to auscultation bilaterally. GI: Soft, nontender, non-distended  MS: No edema; No deformity. Neuro:  Nonfocal  Psych: Normal affect   Labs    High Sensitivity Troponin:  No results for input(s): "TROPONINIHS" in the last 720 hours.   Chemistry Recent Labs  Lab 01/21/22 0948  01/22/22 1250 01/23/22 0328  NA 135 135 136  K 4.1 4.0 4.1  CL 104 109 106  CO2 21 21* 20*  GLUCOSE 101* 112* 78  BUN 11 8 9   CREATININE 0.84 0.72 0.80  CALCIUM 8.4* 8.2* 8.5*  PROT  --  7.3 7.3  ALBUMIN  --  3.6 3.7  AST  --  40 48*  ALT  --  23 25  ALKPHOS  --  90 85  BILITOT  --  1.0 1.8*  GFRNONAA  --  >60 >60  ANIONGAP  --  5 10    Lipids No results for input(s): "CHOL", "TRIG", "HDL", "LABVLDL", "LDLCALC", "CHOLHDL" in the last 168 hours.  Hematology Recent Labs  Lab 01/21/22 0948 01/22/22 1250 01/23/22 0328  WBC 10.5 10.4 11.8*  RBC 2.81* 2.92* 3.38*  HGB 6.5* 6.8* 8.4*  HCT 21.6* 22.2* 26.2*  MCV 77* 76.0* 77.5*  MCH 23.1* 23.3* 24.9*  MCHC 30.1* 30.6 32.1  RDW 17.2* 18.0* 18.9*  PLT 288 290 288   Thyroid  Recent Labs  Lab 01/21/22 0948  TSH 2.320    BNPNo results for input(s): "BNP", "PROBNP" in the last 168  hours.  DDimer No results for input(s): "DDIMER" in the last 168 hours.   Radiology    DG Finger Ring Left  Result Date: 01/22/2022 CLINICAL DATA:  Status post trauma to the fourth left finger. EXAM: LEFT RING FINGER 2+V COMPARISON:  None Available. FINDINGS: There is an acute, very mildly displaced fracture deformity extending through the tuft of the distal phalanx of the fourth left finger. There is no evidence of dislocation. There is no evidence of arthropathy or other focal bone abnormality. Mild soft tissue swelling is noted. IMPRESSION: Acute fracture of the distal phalanx of the fourth left finger. Electronically Signed   By: Aram Candela M.D.   On: 01/22/2022 23:31    Cardiac Studies   LHC 12/06/21:   Prox RCA lesion is 90% stenosed.   Prox RCA to Mid RCA lesion is 20% stenosed.   Mid LM to Dist LM lesion is 15% stenosed.   Prox LAD to Mid LAD lesion is 40% stenosed.   Mid LAD-1 lesion is 75% stenosed.   Mid LAD-2 lesion is 40% stenosed.   Prox Cx lesion is 20% stenosed.   2nd Mrg lesion is 20% stenosed.   A drug-eluting stent  was successfully placed.   Post intervention, there is a 0% residual stenosis.   Multivessel CAD with severely calcified proximal RCA stenosis of 90% followed by mid 20% stenosis in a dominant RCA which supplies the LV apex.   Diffusely calcified proximal LAD stenoses of 40% 75% and 40%.   Mild nonobstructive disease in the distal left main and left circumflex vessel.   LVEDP 21 mmHg.   Difficult but successful PCI to the severely calcified proximal RCA with PTCA, scoring balloon, and intravascular lithotripsy and ultimate insertion of a 3.5 x 20 mm Synergy DES stent postdilated to 3.75 mm with the stenosis being reduced to 0% and successful lithotripsy with resolution of previous significant calcification.   RECOMMENDATION: DAPT for minimum of 12 months probably longer.  Increase medical therapy for concomitant LAD disease.  If patient has recurrent symptomatology will need staged PCI to LAD.  TTE 09/28/21: IMPRESSIONS     1. Left ventricular ejection fraction, by estimation, is 65 to 70%. The  left ventricle has normal function. The left ventricle has no regional  wall motion abnormalities. There is mild left ventricular hypertrophy.  Left ventricular diastolic parameters  were normal.   2. Right ventricular systolic function is normal. The right ventricular  size is normal.   3. The mitral valve is normal in structure. Mild mitral valve  regurgitation.   4. The aortic valve is tricuspid. Aortic valve regurgitation is not  visualized. Aortic valve sclerosis/calcification is present, without any  evidence of aortic stenosis.   5. The inferior vena cava is normal in size with greater than 50%  respiratory variability, suggesting right atrial pressure of 3 mmHg.   Patient Profile     51 y.o. male with history of CAD s/p PCI to RCA in 12/2021, HTN, HLD, and adrenal nodule on CTA who initially presented to cardiology clinic with episode of syncope thought to be due to  orthostasis/vasovagal found to be severely anemic prompting referral to the ER for further management. Cardiology is consulted for management of DAPT in the setting of GIB.  Assessment & Plan    #Iron Deficiency Anemia: #GIB: Patient presented to Cardiology clinic with syncope found to be orthostatic with labs revealing significant anemia with hemoglobin 6.5 from 11 on 12/12/21 concerning for acute GIB  in the setting of recent initiation of DAPT. Received 2u of pRBC overnight with appropriate response. DAPT has temporarily been held. EGD today with small angioectasia treated with APC -Plan to resume ASA 81mg  tomorrow; will ask about timing of brilinta -Continue PPI BID -Continue to trend H/H  #CAD s/p RCA PCI: Patient with recent PCI to RCA in 12/2021. Has residual 75% LAD disease that has been managed medically. DAPT currently being held temporarily given GIB as detailed above. Will resume ASA tomorrow and discuss timing for brilinta. -Plan to resume ASA 81mg  daily tomorrow; will discuss timing of brilinta as above -Continue crestor 40mg  daily -Continue coreg 12.5mg  BID  #HLD: -Continue crestor 40mg  daily      For questions or updates, please contact [facility] Please consult [email or web address] for contact info under        Signed, 01/2022, MD  01/23/2022, 8:08 AM

## 2022-01-24 ENCOUNTER — Encounter (HOSPITAL_COMMUNITY): Payer: Self-pay | Admitting: Family Medicine

## 2022-01-24 DIAGNOSIS — K31811 Angiodysplasia of stomach and duodenum with bleeding: Principal | ICD-10-CM

## 2022-01-24 DIAGNOSIS — D500 Iron deficiency anemia secondary to blood loss (chronic): Secondary | ICD-10-CM

## 2022-01-24 DIAGNOSIS — D509 Iron deficiency anemia, unspecified: Secondary | ICD-10-CM

## 2022-01-24 DIAGNOSIS — D62 Acute posthemorrhagic anemia: Secondary | ICD-10-CM

## 2022-01-24 LAB — CBC
HCT: 25.5 % — ABNORMAL LOW (ref 39.0–52.0)
Hemoglobin: 8.4 g/dL — ABNORMAL LOW (ref 13.0–17.0)
MCH: 24.8 pg — ABNORMAL LOW (ref 26.0–34.0)
MCHC: 32.9 g/dL (ref 30.0–36.0)
MCV: 75.2 fL — ABNORMAL LOW (ref 80.0–100.0)
Platelets: 264 10*3/uL (ref 150–400)
RBC: 3.39 MIL/uL — ABNORMAL LOW (ref 4.22–5.81)
RDW: 19.3 % — ABNORMAL HIGH (ref 11.5–15.5)
WBC: 12.1 10*3/uL — ABNORMAL HIGH (ref 4.0–10.5)
nRBC: 0 % (ref 0.0–0.2)

## 2022-01-24 LAB — HEMOGLOBIN A1C
Hgb A1c MFr Bld: 4.9 % (ref 4.8–5.6)
Mean Plasma Glucose: 94 mg/dL

## 2022-01-24 MED ORDER — SODIUM CHLORIDE 0.9 % IV SOLN
250.0000 mg | Freq: Once | INTRAVENOUS | Status: AC
  Administered 2022-01-24: 250 mg via INTRAVENOUS
  Filled 2022-01-24: qty 20

## 2022-01-24 MED ORDER — FERROUS SULFATE 325 (65 FE) MG PO TABS
325.0000 mg | ORAL_TABLET | ORAL | Status: DC
  Administered 2022-01-24: 325 mg via ORAL
  Filled 2022-01-24: qty 1

## 2022-01-24 MED ORDER — SODIUM CHLORIDE 0.9 % IV SOLN
250.0000 mg | Freq: Once | INTRAVENOUS | Status: AC
  Administered 2022-01-25: 250 mg via INTRAVENOUS
  Filled 2022-01-24: qty 20

## 2022-01-24 NOTE — Progress Notes (Signed)
CSW met with pt at bedside to discuss alcohol use. CSW offered resources. Pt reported that he didn't feel his alcohol consumption was an issue. Pt reported that he does not have withdrawals and has recently slowed down the frequency of his drinks. Pt appears to be hopeful for dc as he has cut back on alcohol use.   Beckey Rutter, MSW, LCSWA, LCASA Transitions of Care  Clinical Social Worker I

## 2022-01-24 NOTE — Discharge Instructions (Addendum)
Dear [name],   Thank you for letting us participate in your care! In this section, you will find a brief hospital admission summary of why you were admitted to the hospital, what happened during your admission, your diagnosis/diagnoses, and recommended follow up.  Primary diagnosis: Anemia that was likely due to a bleed in your intestines.  Treatment plan: The GI doctor treated the bleed and you were given 2 transfusions to bring your red blood cell count back up. Secondary diagnosis: You have a fracture in your 4th finger on your left hand. Treatment plan: This was splinted. Please plan to follow up with the hand surgeon for care outpatient.   POST-HOSPITAL & CARE INSTRUCTIONS We recommend following up with your PCP within 1 week from being discharged from the hospital. Please let PCP/Specialists know of any changes in medications that were made which you will be able to see in the medications section of this packet. Please also follow up with your cardiologist to continue monitoring your stent. Keep your appointment with the hand surgeon on 12/26 for your finger fracture.  DOCTOR'S APPOINTMENTS & FOLLOW UP Future Appointments  Date Time Provider Department Center  03/01/2022  2:20 PM Ivonne Andrew, NP [facility] None  03/06/2022  8:25 AM Azalee Course, PA [facility] None     Thank you for choosing [facility]! Take care and be well!  Family Medicine Teaching Service Inpatient Team   [facility]  [address] [place], [place] [address] [phone]

## 2022-01-24 NOTE — Progress Notes (Signed)
Hemoglobin remains stable. Resumed on DAPT. No further CV work-up at this time. Will arrange for outpatient follow-up with Cardiology.  Laurance Flatten, MD

## 2022-01-24 NOTE — Progress Notes (Signed)
  Transition of Care [facility]) Screening Note   Patient Details  Name: Gene Reed Date of Birth: 11/06/1970   Transition of Care [facility]) CM/SW Contact:    Harriet Masson, RN Phone Number: 01/24/2022, 8:51 AM    Transition of Care Department [facility]) has reviewed patient.  TOC will follow up on the substance abuse counseling consult.  Patient with recent cardiac catheterization and was started on Brillnta.Patient admitted for anemia and melena.  Patient does not have insurance. Patient has PCP at patient care center and [facility].    We will continue to monitor patient advancement through interdisciplinary progression rounds. If new patient transition needs arise, please place a TOC consult.

## 2022-01-24 NOTE — Progress Notes (Signed)
     Daily Progress Note Intern Pager: [phone]  Patient name: Gene Reed Medical record number: [id number] Date of birth: 1971/01/02 Age: 51 y.o. Gender: male  Primary Care Provider: Ivonne Andrew, NP Consultants: GI, Cardiology Code Status: Full code   Pt Overview and Major Events to Date:  12/19: Admitted to FMTS; GI and cardiology consulted   Assessment and Plan: Gene Reed is a 51 y.o. male presenting with anemia suspected to be related to GI bleed/ PMHx includes CAD with recent stent, HTN, HLD, and gout.  * Anemia Hgb >8, stable. Asymptomatic. Restarted ASA and Brilinta today, will monitor Hgb on medication. Anemia work up suggestive of iron deficiency anemia. Started on oral iron supplementation in addition to IV iron transfusions. -GI consulted, appreciate recommendations -Protonix 40mg  BID x 4 weeks -Start on oral iron supplementation -IV iron transfusion -CBC in AM  Displaced fracture of distal phalanx of left ring finger Plan to f/u with hand surgery outpatient  Tooth pain Complained of tooth pain to nursing staff this afternoon. Pain managed with Tylenol. Will refer to outpatient dentistry for further evaluation.  Alcohol use Denies symptoms of withdrawal upon exam. -MVI, folic acid 1 mg, thiamine 100 mg daily  Hyperglycemia-resolved as of 01/24/2022 A1c 4.9   Chronic, stable conditions: HLD: Continue rosuvastatin 40 mg daily Gout: Continue allopurinol 100 mg daily HTN: Continue Coreg 12.5 mg twice daily OSA: continue CPAP nightly  FEN/GI: Regular PPx: ASA + Brilinta Dispo: Home likely 12/22  Subjective:  Patient assessed at bedside, states he feels overall improved. States he is eating well. Denies bloody stools. Denies any symptoms of alcohol withdrawal.  Objective: Temp:  [97 F (36.1 C)-98.9 F (37.2 C)] 98.1 F (36.7 C) (12/21 1200) Pulse Rate:  [67-84] 82 (12/21 1200) Resp:  [14-19] 14 (12/21 1200) BP: (111-126)/(49-79) 111/49  (12/21 1200) SpO2:  [93 %-100 %] 94 % (12/21 1200) Weight:  06-02-1997 kg] 116 kg (12/21 0518) Physical Exam: General: Well appearing, alert, NAD Cardiovascular: RRR without murmur Respiratory: CTAB. Normal WOB on Ra Abdomen: Soft, obese, non-tender to palpation Extremities: Well perfused. No peripheral edema  Laboratory: Most recent CBC Lab Results  Component Value Date   WBC 12.1 (H) 01/24/2022   HGB 8.4 (L) 01/24/2022   HCT 25.5 (L) 01/24/2022   MCV 75.2 (L) 01/24/2022   PLT 264 01/24/2022   Most recent BMP    Latest Ref Rng & Units 01/23/2022    3:28 AM  BMP  Glucose 70 - 99 mg/dL 78   BUN 6 - 20 mg/dL 9   Creatinine 01/25/2022 - 2.95 mg/dL 2.84   Sodium 1.32 - 440 mmol/L 136   Potassium 3.5 - 5.1 mmol/L 4.1   Chloride 98 - 111 mmol/L 106   CO2 22 - 32 mmol/L 20   Calcium 8.9 - 10.3 mg/dL 8.5     Other pertinent labs: Iron: 22 TIBC: 532 Ferritin: 17 Folate: 17 B12: 341  102, MD 01/24/2022, 1:58 PM  PGY-1, [facility] Family Medicine FPTS Intern pager: [phone], text pages welcome Secure chat group [facility] Teaching Service

## 2022-01-24 NOTE — Progress Notes (Signed)
FMTS Brief Progress Note -late entry  S: Night rounding.  Patient found sitting in chair at bedside with his girlfriend.  He appears in good spirits and reports he is feeling quite well after his GI procedure today.  Reports normal bowel movement after the procedure with no dark tarry character or bright red blood.  O: BP 125/72 (BP Location: Right Arm)   Pulse 81   Temp 98.7 F (37.1 C) (Oral)   Resp 16   SpO2 99%   General: Awake, alert, no acute distress Respiratory: Speaking clearly in full sentences Abdomen: Soft, nontender in all quadrants  A/P: Anemia: Plan for a.m. CBC.  Will refer to GI for diet advancement.  - Orders reviewed. Labs for AM ordered, which was adjusted as needed.   Fayette Pho, MD 01/24/2022, 1:01 AM PGY-3, [place] [facility] Night Resident  Please page [phone] with questions.

## 2022-01-24 NOTE — Progress Notes (Addendum)
Attending physician's note   I have taken a history, reviewed the chart, and examined the patient. I performed a substantive portion of this encounter, including complete performance of at least one of the key components, in conjunction with the APP. I agree with the APP's note, impression, and recommendations with my edits.   No acute events overnight.  No overt bleeding.  H/H largely stable.  Labs n/f IDA.  Was restarted on DAPT earlier today.  - IV iron today.  Will set up for second dose as an outpatient - Advance diet as tolerated - Continue Protonix 40 mg p.o. twice daily x 4 weeks, then reduce to 40 mg daily.  He has a history of GERD, treated with Prilosec, and can resume Prilosec 20 mg/day when completing the Protonix course - Plan for CBC as outpatient 7-10 days after hospital discharge to ensure continued improvement - Will arrange for follow-up in the GI clinic - Will eventually need outpatient colonoscopy for routine CRC screening.  Can schedule to be done in 6+ months when able to hold antiplatelet therapy - Inpatient GI service will sign off at this time  Doristine Locks, DO, FACG [phone] office          Progress Note   Subjective  Chief Complaint: Melena, symptomatic microcytic anemia and acute blood loss anemia  EGD 01/23/2022 with a single angiectasia in the first portion of the duodenum treated with APC, hemoglobin stable overnight  Today, the patient tells me that he is feeling well, no new complaints or concerns.  Apparently the hospitalist spoken to him and told him they are going to restart his blood thinner and keep him one more night to make sure that his hemoglobin does not drop.  He tells me that he is going to decrease all of his bad habits including smoking marijuana and drinking alcohol as he knows this is affecting his health.   Objective   Vital signs in last 24 hours: Temp:  [97 F (36.1 C)-98.9 F (37.2 C)] 98.3 F (36.8 C) (12/21  0853) Pulse Rate:  [67-84] 71 (12/21 0800) Resp:  [15-19] 18 (12/21 0800) BP: (113-139)/(61-79) 126/79 (12/21 0800) SpO2:  [93 %-100 %] 93 % (12/21 0800) Weight:  [098 kg] 116 kg (12/21 0518) Last BM Date : 01/23/22 General:    AA male in NAD Heart:  Regular rate and rhythm; no murmurs Lungs: Respirations even and unlabored, lungs CTA bilaterally Abdomen:  Soft, nontender and nondistended. Normal bowel sounds. Psych:  Cooperative. Normal mood and affect.  Intake/Output from previous day: 12/20 0701 - 12/21 0700 In: -  Out: 900 [Urine:900] Intake/Output this shift: Total I/O In: 480 [P.O.:480] Out: -   Lab Results: Recent Labs    01/23/22 1356 01/23/22 1607 01/24/22 0322  WBC 9.9 10.6* 12.1*  HGB 8.6* 8.9* 8.4*  HCT 26.3* 27.7* 25.5*  PLT 276 282 264   BMET Recent Labs    01/22/22 1250 01/23/22 0328  NA 135 136  K 4.0 4.1  CL 109 106  CO2 21* 20*  GLUCOSE 112* 78  BUN 8 9  CREATININE 0.72 0.80  CALCIUM 8.2* 8.5*   LFT Recent Labs    01/23/22 0328  PROT 7.3  ALBUMIN 3.7  AST 48*  ALT 25  ALKPHOS 85  BILITOT 1.8*   PT/INR Recent Labs    01/22/22 1250  LABPROT 15.0  INR 1.2    Studies/Results: DG Finger Ring Left  Result Date: 01/22/2022 CLINICAL DATA:  Status post trauma to the fourth left finger. EXAM: LEFT RING FINGER 2+V COMPARISON:  None Available. FINDINGS: There is an acute, very mildly displaced fracture deformity extending through the tuft of the distal phalanx of the fourth left finger. There is no evidence of dislocation. There is no evidence of arthropathy or other focal bone abnormality. Mild soft tissue swelling is noted. IMPRESSION: Acute fracture of the distal phalanx of the fourth left finger. Electronically Signed   By: Aram Candela M.D.   On: 01/22/2022 23:31      Assessment / Plan:   Assessment: 1.  Symptomatic microcytic anemia with melena: Status post EGD 12/20 with angiectasia in the duodenum treated with APC,  hemoglobin stable overnight 2.  CAD with DES to RCA: On Brilinta, held for procedure and restarted today 3.  DAPT: As above 4.  Alcohol use disorder  Plan: 1.  Continue to monitor hemoglobin while patient is hospitalized 2.  Can advance diet as tolerated 3.  We will sign off  Please call us back if we can be of any further assistance   LOS: 1 day   Unk Lightning  01/24/2022, 11:01 AM

## 2022-01-24 NOTE — Plan of Care (Signed)

## 2022-01-24 NOTE — Discharge Summary (Addendum)
Family Medicine Teaching [facility] Discharge Summary  Patient name: Gene Reed Medical record number: [id number] Date of birth: Dec 01, 1970 Age: 51 y.o. Gender: male Date of Admission: 01/22/2022  Date of Discharge: 01/25/2022 Admitting Physician: Westley Chandler, MD  Primary Care Provider: Ivonne Andrew, NP Consultants: Cardiology, GI  Indication for Hospitalization: Anemia  Discharge Diagnoses/Problem List:  Principal Problem for Admission: GI bleed Other Problems addressed during stay:  Principal Problem:   Anemia Active Problems:   Alcohol use   Tooth pain   Orthostatic syncope   Displaced fracture of distal phalanx of left ring finger   Acute blood loss anemia   Melena   Microcytic anemia   Symptomatic anemia   AVM (arteriovenous malformation) of duodenum, acquired with hemorrhage   Upper GI bleed   Brief Hospital Course:  Gene Reed is a 51 y.o.male with a history of CAD s/p PCI on DAPT, HTN, HLD, gout, alcohol use, GERD, OSA, adrenal nodule who was admitted to the [facility] Medicine Teaching Service at [facility] for anemia 2/2 upper GI bleed. His hospital course is detailed below:  Anemia 2/2 upper GI bleed Presented with hemoglobin 6.8, received 2u pRBC. DAPT held. Received ceftriaxone for concern of variceal bleed. Started on octreotide and Protonix IV for GI prophylaxis.  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 Aspirin and Brilinta restarted the next day. Received IV iron x2. Hemoglobin stable >8.0 after starting back DAPT.  Displaced fracture of distal phalanx of left ring finger Injury occurred on 12/16.  X-ray confirmed mild displacement of distal phalanx fracture.  Splinted in the ED and hand surgery recommended follow-up outpatient.  Other chronic conditions were medically managed with home medications and formulary alternatives as necessary (HLD, HTN, OSA, gout)  PCP Follow-up  Recommendations:  Hand surgery outpatient f/u for L 4th finger fx Outpatient GI colonoscopy (ideally > 6 months from stent) Consider naltrexone given alcohol use Emphasized dentistry follow-up for #16 tooth pain  Disposition: Home  Discharge Condition: Stable  Discharge Exam:  Vitals:   01/25/22 0400 01/25/22 0933  BP: 127/78 (!) 141/88  Pulse: 75 80  Resp: 18 17  Temp: (!) 97.4 F (36.3 C) 98.4 F (36.9 C)  SpO2: 97% 98%   General: Well-appearing. Alert. NAD CV: RRR without murmur Pulm: CTAB. Normal WOB on RA. No wheezing Abdomen: Soft, non-tender, non-distended. +BS Ext: Well perfused. Cap refill < 3 seconds  Significant Procedures: Endoscopy with cauterization of proximal duodenum AVM  Significant Labs and Imaging:  Recent Labs  Lab 01/23/22 1607 01/24/22 0322 01/25/22 0359  WBC 10.6* 12.1* 11.5*  HGB 8.9* 8.4* 8.4*  HCT 27.7* 25.5* 25.7*  PLT 282 264 277   No results for input(s): "NA", "K", "CL", "CO2", "GLUCOSE", "BUN", "CREATININE", "CALCIUM", "MG", "PHOS", "ALKPHOS", "AST", "ALT", "ALBUMIN", "PROTEIN" in the last 48 hours.   Pertinent Imaging: DG Finger Ring Left Result Date: 01/22/2022 IMPRESSION: Acute fracture of the distal phalanx of the fourth left finger.  Results/Tests Pending at Time of Discharge: None  Discharge Medications:  Allergies as of 01/25/2022       Reactions   Poison Ivy Extract Itching   Poison oak        Medication List     STOP taking these medications    ezetimibe 10 MG tablet Commonly known as: ZETIA   omeprazole 20 MG capsule Commonly known as: PRILOSEC       TAKE these medications  allopurinol 100 MG tablet Commonly known as: ZYLOPRIM TAKE 1 TABLET (100 MG TOTAL) BY MOUTH DAILY. What changed: how much to take   aspirin EC 81 MG tablet Take 1 tablet (81 mg total) by mouth daily. Swallow whole.   Brilinta 90 MG Tabs tablet Generic drug: ticagrelor Take 1 tablet (90 mg total) by mouth 2 (two) times  daily.   carvedilol 12.5 MG tablet Commonly known as: COREG Take 1 tablet (12.5 mg total) by mouth 2 (two) times daily.   ferrous sulfate 325 (65 FE) MG tablet Take 1 tablet (325 mg total) by mouth every other day.   nitroGLYCERIN 0.4 MG SL tablet Commonly known as: NITROSTAT Place 1 tablet (0.4 mg total) under the tongue every 5 (five) minutes as needed for chest pain.   pantoprazole 40 MG tablet Commonly known as: PROTONIX Take 1 tablet (40 mg total) by mouth 2 (two) times daily.   rosuvastatin 40 MG tablet Commonly known as: CRESTOR Take 1 tablet (40 mg total) by mouth daily.        Discharge Instructions: Please refer to Patient Instructions section of EMR for full details.  Patient was counseled important signs and symptoms that should prompt return to medical care, changes in medications, dietary instructions, activity restrictions, and follow up appointments.   Follow-Up Appointments:  Follow-up Information     Ivonne Andrew, NP Follow up.   Specialties: Pulmonary Disease, Endocrinology Why: Please follow up with your PCP in 1 week Contact information: [address], [address] [place] [address] [phone]         Mack Hook, MD Follow up.   Specialty: Orthopedic Surgery Why: 12/26 in AM Contact information: [address] [place] [address] [phone]                 Elberta Fortis, MD 01/25/2022, 11:02 AM PGY-1, [employer] Family [facility]   FPTS Upper-Level Resident Addendum   I have independently interviewed and examined the patient. I have discussed the above with Dr. Ardyth Harps and agree with the documented plan. My edits for correction/addition/clarification are included above. Please see any attending notes.   Maury Dus, MD PGY-3, [facility] 01/25/2022 12:28 PM  FPTS Service pager: [phone] (text pages welcome through AMION)

## 2022-01-24 NOTE — Progress Notes (Signed)
Patient has home CPAP unit for use and already on CPAP when RT checked on patient.

## 2022-01-25 ENCOUNTER — Other Ambulatory Visit (HOSPITAL_COMMUNITY): Payer: Self-pay

## 2022-01-25 LAB — CBC
HCT: 25.7 % — ABNORMAL LOW (ref 39.0–52.0)
Hemoglobin: 8.4 g/dL — ABNORMAL LOW (ref 13.0–17.0)
MCH: 24.5 pg — ABNORMAL LOW (ref 26.0–34.0)
MCHC: 32.7 g/dL (ref 30.0–36.0)
MCV: 74.9 fL — ABNORMAL LOW (ref 80.0–100.0)
Platelets: 277 10*3/uL (ref 150–400)
RBC: 3.43 MIL/uL — ABNORMAL LOW (ref 4.22–5.81)
RDW: 19.9 % — ABNORMAL HIGH (ref 11.5–15.5)
WBC: 11.5 10*3/uL — ABNORMAL HIGH (ref 4.0–10.5)
nRBC: 0 % (ref 0.0–0.2)

## 2022-01-25 MED ORDER — FERROUS SULFATE 325 (65 FE) MG PO TABS
325.0000 mg | ORAL_TABLET | ORAL | 0 refills | Status: DC
  Filled 2022-01-25: qty 30, 60d supply, fill #0

## 2022-01-25 MED ORDER — PANTOPRAZOLE SODIUM 40 MG PO TBEC
40.0000 mg | DELAYED_RELEASE_TABLET | Freq: Two times a day (BID) | ORAL | 0 refills | Status: DC
  Filled 2022-01-25: qty 60, 30d supply, fill #0

## 2022-01-25 NOTE — Plan of Care (Signed)

## 2022-01-27 ENCOUNTER — Emergency Department (HOSPITAL_COMMUNITY)
Admission: EM | Admit: 2022-01-27 | Discharge: 2022-01-27 | Disposition: A | Discharge status: Home / Self Care | Payer: Self-pay | Attending: Emergency Medicine | Admitting: Emergency Medicine

## 2022-01-27 ENCOUNTER — Emergency Department (HOSPITAL_COMMUNITY): Payer: Self-pay

## 2022-01-27 DIAGNOSIS — Z7982 Long term (current) use of aspirin: Secondary | ICD-10-CM | POA: Insufficient documentation

## 2022-01-27 DIAGNOSIS — Y713 Surgical instruments, materials and cardiovascular devices (including sutures) associated with adverse incidents: Secondary | ICD-10-CM | POA: Insufficient documentation

## 2022-01-27 DIAGNOSIS — Z79899 Other long term (current) drug therapy: Secondary | ICD-10-CM | POA: Insufficient documentation

## 2022-01-27 DIAGNOSIS — T8172XA Complication of vein following a procedure, not elsewhere classified, initial encounter: Secondary | ICD-10-CM | POA: Insufficient documentation

## 2022-01-27 DIAGNOSIS — I10 Essential (primary) hypertension: Secondary | ICD-10-CM | POA: Insufficient documentation

## 2022-01-27 LAB — CBC WITH DIFFERENTIAL/PLATELET
Abs Immature Granulocytes: 0.04 10*3/uL (ref 0.00–0.07)
Basophils Absolute: 0.1 10*3/uL (ref 0.0–0.1)
Basophils Relative: 1 %
Eosinophils Absolute: 0.2 10*3/uL (ref 0.0–0.5)
Eosinophils Relative: 1 %
HCT: 28.2 % — ABNORMAL LOW (ref 39.0–52.0)
Hemoglobin: 9.2 g/dL — ABNORMAL LOW (ref 13.0–17.0)
Immature Granulocytes: 0 %
Lymphocytes Relative: 31 %
Lymphs Abs: 4.1 10*3/uL — ABNORMAL HIGH (ref 0.7–4.0)
MCH: 25.6 pg — ABNORMAL LOW (ref 26.0–34.0)
MCHC: 32.6 g/dL (ref 30.0–36.0)
MCV: 78.6 fL — ABNORMAL LOW (ref 80.0–100.0)
Monocytes Absolute: 1.2 10*3/uL — ABNORMAL HIGH (ref 0.1–1.0)
Monocytes Relative: 9 %
Neutro Abs: 7.6 10*3/uL (ref 1.7–7.7)
Neutrophils Relative %: 58 %
Platelets: 296 10*3/uL (ref 150–400)
RBC: 3.59 MIL/uL — ABNORMAL LOW (ref 4.22–5.81)
RDW: 21.7 % — ABNORMAL HIGH (ref 11.5–15.5)
WBC: 13.2 10*3/uL — ABNORMAL HIGH (ref 4.0–10.5)
nRBC: 0 % (ref 0.0–0.2)

## 2022-01-27 LAB — COMPREHENSIVE METABOLIC PANEL
ALT: 46 U/L — ABNORMAL HIGH (ref 0–44)
AST: 40 U/L (ref 15–41)
Albumin: 3.9 g/dL (ref 3.5–5.0)
Alkaline Phosphatase: 109 U/L (ref 38–126)
Anion gap: 11 (ref 5–15)
BUN: 7 mg/dL (ref 6–20)
CO2: 22 mmol/L (ref 22–32)
Calcium: 9 mg/dL (ref 8.9–10.3)
Chloride: 106 mmol/L (ref 98–111)
Creatinine, Ser: 0.74 mg/dL (ref 0.61–1.24)
GFR, Estimated: >60 mL/min (ref >60)
Glucose, Bld: 104 mg/dL — ABNORMAL HIGH (ref 70–99)
Potassium: 3.8 mmol/L (ref 3.5–5.1)
Sodium: 139 mmol/L (ref 135–145)
Total Bilirubin: 0.8 mg/dL (ref 0.3–1.2)
Total Protein: 7.7 g/dL (ref 6.5–8.1)

## 2022-01-27 LAB — PROTIME-INR
INR: 1.1 (ref 0.8–1.2)
Prothrombin Time: 14.5 seconds (ref 11.4–15.2)

## 2022-01-27 MED ORDER — OXYCODONE-ACETAMINOPHEN 5-325 MG PO TABS: 1.0000 | ORAL_TABLET | Freq: Four times a day (QID) | ORAL | 0 refills | Status: DC | PRN

## 2022-01-27 MED ORDER — OXYCODONE-ACETAMINOPHEN 5-325 MG PO TABS
1.0000 | ORAL_TABLET | Freq: Once | ORAL | Status: AC
  Administered 2022-01-27: 1 via ORAL
  Filled 2022-01-27: qty 1

## 2022-01-27 MED ORDER — CEPHALEXIN 250 MG PO CAPS
500.0000 mg | ORAL_CAPSULE | Freq: Once | ORAL | Status: AC
  Administered 2022-01-27: 500 mg via ORAL
  Filled 2022-01-27: qty 2

## 2022-01-27 MED ORDER — CEPHALEXIN 500 MG PO CAPS: 500.0000 mg | ORAL_CAPSULE | Freq: Four times a day (QID) | ORAL | 0 refills | Status: AC

## 2022-01-27 MED ORDER — HYDROCODONE-ACETAMINOPHEN 5-325 MG PO TABS
1.0000 | ORAL_TABLET | Freq: Once | ORAL | Status: AC
  Administered 2022-01-27: 1 via ORAL
  Filled 2022-01-27: qty 1

## 2022-01-27 NOTE — ED Provider Notes (Signed)
[name] EMERGENCY DEPARTMENT Provider Note   CSN: [id number] Arrival date & time: 01/27/22  1928     History  Chief Complaint  Patient presents with   Hand Pain   Hand Problem    right    Gene Reed is a 51 y.o. male.  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  Patient reports he had an IV in this hand while hospitalized and during his hospitalization it was sore but did not have any infiltration that he is aware of.  He had an iron infusion on Friday and then they removed the IV and he went home.  He reports that it was sore when he left on Friday but then on Saturday he started noticing swelling and worsening pain.  The swelling is much worse today involving his entire hand and slightly above his wrist.  He is also having worsening throbbing pain.  He has not felt unwell otherwise.  He has not had any fevers.  Other than the IV he had no other procedures done on that arm.  He has no pain up in his axilla or upper chest.  The history is provided by the patient.  Hand Pain       Home Medications Prior to Admission medications   Medication Sig Start Date End Date Taking? Authorizing Provider  cephALEXin (KEFLEX) 500 MG capsule Take 1 capsule (500 mg total) by mouth 4 (four) times daily for 7 days. 01/27/22 02/03/22 Yes Wasif Simonich, Alphonzo Lemmings, MD  oxyCODONE-acetaminophen (PERCOCET/ROXICET) 5-325 MG tablet Take 1 tablet by mouth every 6 (six) hours as needed for severe pain. 01/27/22  Yes Luna Audia, Alphonzo Lemmings, MD  allopurinol (ZYLOPRIM) 100 MG tablet TAKE 1 TABLET (100 MG TOTAL) BY MOUTH DAILY. Patient taking differently: Take 100 mg by mouth daily. 08/20/21 08/20/22  Massie Maroon, FNP  aspirin EC 81 MG tablet Take 1 tablet (81 mg total) by mouth daily.  Swallow whole. 11/29/21   Marjie Skiff E, PA-C  carvedilol (COREG) 12.5 MG tablet Take 1 tablet (12.5 mg total) by mouth 2 (two) times daily. 01/07/22   Lennette Bihari, MD  ferrous sulfate 325 (65 FE) MG tablet Take 1 tablet (325 mg total) by mouth every other day. 01/25/22   Maury Dus, MD  nitroGLYCERIN (NITROSTAT) 0.4 MG SL tablet Place 1 tablet (0.4 mg total) under the tongue every 5 (five) minutes as needed for chest pain. 11/29/21 02/27/22  Marjie Skiff E, PA-C  pantoprazole (PROTONIX) 40 MG tablet Take 1 tablet (40 mg total) by mouth 2 (two) times daily. 01/25/22 02/24/22  Maury Dus, MD  rosuvastatin (CRESTOR) 40 MG tablet Take 1 tablet (40 mg total) by mouth daily. 12/07/21   Dunn, Tacey Ruiz, PA-C  ticagrelor (BRILINTA) 90 MG TABS tablet Take 1 tablet (90 mg total) by mouth 2 (two) times daily. 12/07/21   Dunn, Tacey Ruiz, PA-C      Allergies    Poison ivy extract    Review of Systems   Review of Systems  Physical Exam Updated Vital Signs BP (!) 136/94 (BP Location: Right Arm)   Pulse 80   Temp 98.3 F (36.8 C)   Resp 20   Ht 5\' 9"  (1.753 m)   Wt  115.7 kg   SpO2 100%   BMI 37.66 kg/m  Physical Exam Vitals and nursing note reviewed.  Constitutional:      General: He is not in acute distress.    Appearance: He is well-developed. He is not ill-appearing.  HENT:     Head: Normocephalic and atraumatic.  Eyes:     Conjunctiva/sclera: Conjunctivae normal.     Pupils: Pupils are equal, round, and reactive to light.  Cardiovascular:     Rate and Rhythm: Normal rate and regular rhythm.     Heart sounds: No murmur heard. Pulmonary:     Effort: Pulmonary effort is normal. No respiratory distress.     Breath sounds: Normal breath sounds. No wheezing or rales.  Abdominal:     General: There is no distension.     Palpations: Abdomen is soft.     Tenderness: There is no abdominal tenderness. There is no guarding or rebound.  Musculoskeletal:        General: Swelling  and tenderness present. Normal range of motion.       Hands:     Cervical back: Normal range of motion and neck supple.  Skin:    General: Skin is warm and dry.     Findings: No erythema or rash.  Neurological:     Mental Status: He is alert and oriented to person, place, and time.  Psychiatric:        Mood and Affect: Mood normal.        Behavior: Behavior normal.     ED Results / Procedures / Treatments   Labs (all labs ordered are listed, but only abnormal results are displayed) Labs Reviewed  CBC WITH DIFFERENTIAL/PLATELET - Abnormal; Notable for the following components:      Result Value   WBC 13.2 (*)    RBC 3.59 (*)    Hemoglobin 9.2 (*)    HCT 28.2 (*)    MCV 78.6 (*)    MCH 25.6 (*)    RDW 21.7 (*)    Lymphs Abs 4.1 (*)    Monocytes Absolute 1.2 (*)    All other components within normal limits  COMPREHENSIVE METABOLIC PANEL - Abnormal; Notable for the following components:   Glucose, Bld 104 (*)    ALT 46 (*)    All other components within normal limits  PROTIME-INR    EKG None  Radiology DG Hand Complete Right  Result Date: 01/27/2022 CLINICAL DATA:  Right hand swelling following transfusion. EXAM: RIGHT HAND - COMPLETE 3+ VIEW COMPARISON:  None Available. FINDINGS: There is no evidence of fracture or dislocation. Degenerative changes are noted at the interphalangeal joints and first carpometacarpal joint. No radiopaque foreign body is identified. There is diffuse soft tissue swelling. IMPRESSION: No acute osseous abnormality or radiopaque foreign body. Electronically Signed   By: Thornell Sartorius M.D.   On: 01/27/2022 20:34    Procedures Procedures    Medications Ordered in ED Medications  HYDROcodone-acetaminophen (NORCO/VICODIN) 5-325 MG per tablet 1 tablet (1 tablet Oral Given 01/27/22 2012)  cephALEXin (KEFLEX) capsule 500 mg (500 mg Oral Given 01/27/22 2158)  oxyCODONE-acetaminophen (PERCOCET/ROXICET) 5-325 MG per tablet 1 tablet (1 tablet Oral  Given 01/27/22 2158)    ED Course/ Medical Decision Making/ A&P                           Medical Decision Making Amount and/or Complexity of Data Reviewed External Data Reviewed: notes.  Details: Recent hospitalization Labs: ordered. Decision-making details documented in ED Course. Radiology: ordered and independent interpretation performed. Decision-making details documented in ED Course.  Risk Prescription drug management.   Pt with multiple medical problems and comorbidities and presenting today with a complaint that caries a high risk for morbidity and mortality.  Here today with pain in his hand and swelling.  He denies any systemic symptoms.  Patient has obvious hand swelling on the right with some mild red streaking from his venipuncture site.  Concern for thrombophlebitis from recent IV site and concern for possible early infection.  I independently interpreted patient's labs he has minimally elevated white count from baseline at 13.  When he left the hospital it was 11, hemoglobin is improving now 9.2 from 8, CMP within normal limits INR is normal. I have independently visualized and interpreted pt's images today.  Hand film shows swelling but no other acute findings.  Bedside ultrasound shows clot in the superficial vein.  He has normal radial pulse and do not feel that there is a arterial abnormality.  Low suspicion for a more proximal DVT.  Feel the patient has thrombophlebitis but may have early infection as well.  No evidence to suspect abscess at this time.  Ultrasound negative for abscess.  Discussed the point findings with patient and his wife.  Will put patient in a splint to keep the hand more stable.  Will start Keflex to cover for staph and strep species patient does not have a prior history of MRSA.  Also recommended that he elevate use heat to the area and continue the antibiotics.  He was given follow-up with hand surgery.  He was also given pain medication.  Patient given  return precautions.          Final Clinical Impression(s) / ED Diagnoses Final diagnoses:  Thrombophlebitis following infusion    Rx / DC Orders ED Discharge Orders          Ordered    oxyCODONE-acetaminophen (PERCOCET/ROXICET) 5-325 MG tablet  Every 6 hours PRN        01/27/22 2225    cephALEXin (KEFLEX) 500 MG capsule  4 times daily        01/27/22 2225              Gwyneth Sprout, MD 01/27/22 2228

## 2022-01-27 NOTE — ED Provider Triage Note (Signed)
Emergency Medicine Provider Triage Evaluation Note  [name] , a 51 y.o. male  was evaluated in triage.  Pt complains of R hand swelling and pain for the last 3 days after getting out of hospital for blood transfusion. IV was in that hand. Reports hand is so swollen can't make fist. No fever or chills. Hx of gout--typically in feet.   Review of Systems  Positive: R hand swelling Negative: fever  Physical Exam  BP 128/82   Pulse 79   Temp 98.3 F (36.8 C)   Resp 18   SpO2 99%  Gen:   Awake, no distress   Resp:  Normal effort  MSK:   Moves extremities without difficulty  Other:  +R hand swelling and mild erythema  Medical Decision Making  Medically screening exam initiated at 7:53 PM.  Appropriate orders placed.  Melquisedec L Heid was informed that the remainder of the evaluation will be completed by another provider, this initial triage assessment does not replace that evaluation, and the importance of remaining in the ED until their evaluation is complete.    Pete Pelt, [place] 01/27/22 1954

## 2022-01-27 NOTE — ED Triage Notes (Signed)
Pt to ED c/o right hand swelling, reports Friday was discharged from the hospital and had IV placed in right. Hand, noticed hand swelling on Saturday. Painful.

## 2022-01-27 NOTE — Discharge Instructions (Signed)
It appears that you have thrombophlebitis today after recently having the IV.  However we will will cover with an antibiotic to in case there is an early infection starting.  On the ultrasound today there was no sign of pus pocket.  Elevate the arm above your heart, use warm compresses, antibiotic and pain medicine was sent to the pharmacy.  If symptoms are not improving by Tuesday call the hand doctor.  If you are unable to see him you can return to the ER if things are getting worse.

## 2022-01-28 ENCOUNTER — Encounter (HOSPITAL_COMMUNITY): Payer: Self-pay | Admitting: Gastroenterology

## 2022-01-28 NOTE — Progress Notes (Signed)
Orthopedic Tech Progress Note Patient Details:  Gene Reed [birth year] [id number]  Ortho Devices Type of Ortho Device: Volar splint Ortho Device/Splint Location: rue long volar Ortho Device/Splint Interventions: Ordered, Application, Adjustment  The dr said I could do a regular or long volar. They said to talk to the patient about it and see which would suit them better. Post Interventions Patient Tolerated: Well Instructions Provided: Care of device, Adjustment of device  Trinna Post 01/28/2022, 12:33 AM

## 2022-01-29 ENCOUNTER — Other Ambulatory Visit: Payer: Self-pay

## 2022-01-29 ENCOUNTER — Telehealth: Payer: Self-pay

## 2022-01-29 DIAGNOSIS — D649 Anemia, unspecified: Secondary | ICD-10-CM

## 2022-01-29 DIAGNOSIS — D500 Iron deficiency anemia secondary to blood loss (chronic): Secondary | ICD-10-CM | POA: Insufficient documentation

## 2022-01-29 DIAGNOSIS — K922 Gastrointestinal hemorrhage, unspecified: Secondary | ICD-10-CM

## 2022-01-29 NOTE — Telephone Encounter (Signed)
-----   Message from [facility] V, DO sent at 01/24/2022  2:47 PM EST ----- I anticipate this patient will be discharged in the next day or so.  Please arrange for the following: - Check CBC in 10 days -IV iron as an outpatient.  He will receive dose #1 today, and dose #2 can be in 2 weeks or so -Follow-up in the GI clinic in 4-8 weeks with me or one of the APP's  Thank you

## 2022-01-29 NOTE — Telephone Encounter (Signed)
Order placed for IV iron and CBC. Called pt and gave him Dr. Frankey Shown recommendations. Pt scheduled for follow up appointment with Willette Cluster NP on 03/01/22 at 8:30 am. Reminder placed to call pt to remind him of CBC. Pt verbalized understanding and had no concerns at end of call.

## 2022-01-29 NOTE — Addendum Note (Signed)
Addended by: Garen Lah A on: 01/29/2022 09:05 AM   Modules accepted: Orders

## 2022-01-30 ENCOUNTER — Telehealth: Payer: Self-pay | Admitting: Pharmacy Technician

## 2022-01-30 NOTE — Telephone Encounter (Signed)
Dr. Barron Alvine,  Patient is un-insured and will need patient assistance. Please sign forms and return to me as soon as possible. Forms have been faxed to: [phone]

## 2022-02-01 ENCOUNTER — Other Ambulatory Visit: Payer: Self-pay

## 2022-02-01 ENCOUNTER — Other Ambulatory Visit: Payer: Self-pay | Admitting: Nurse Practitioner

## 2022-02-05 ENCOUNTER — Other Ambulatory Visit (INDEPENDENT_AMBULATORY_CARE_PROVIDER_SITE_OTHER): Payer: Self-pay

## 2022-02-05 ENCOUNTER — Telehealth: Payer: Self-pay

## 2022-02-05 DIAGNOSIS — D500 Iron deficiency anemia secondary to blood loss (chronic): Secondary | ICD-10-CM

## 2022-02-05 DIAGNOSIS — D649 Anemia, unspecified: Secondary | ICD-10-CM

## 2022-02-05 DIAGNOSIS — K922 Gastrointestinal hemorrhage, unspecified: Secondary | ICD-10-CM

## 2022-02-05 LAB — CBC WITH DIFFERENTIAL/PLATELET
Basophils Absolute: 0.1 10*3/uL (ref 0.0–0.1)
Basophils Relative: 0.7 % (ref 0.0–3.0)
Eosinophils Absolute: 0.2 10*3/uL (ref 0.0–0.7)
Eosinophils Relative: 2.2 % (ref 0.0–5.0)
HCT: 30.4 % — ABNORMAL LOW (ref 39.0–52.0)
Hemoglobin: 10.1 g/dL — ABNORMAL LOW (ref 13.0–17.0)
Lymphocytes Relative: 30.7 % (ref 12.0–46.0)
Lymphs Abs: 2.8 10*3/uL (ref 0.7–4.0)
MCHC: 33.1 g/dL (ref 30.0–36.0)
MCV: 78.1 fl (ref 78.0–100.0)
Monocytes Absolute: 0.7 10*3/uL (ref 0.1–1.0)
Monocytes Relative: 7.6 % (ref 3.0–12.0)
Neutro Abs: 5.3 10*3/uL (ref 1.4–7.7)
Neutrophils Relative %: 58.8 % (ref 43.0–77.0)
Platelets: 305 10*3/uL (ref 150.0–400.0)
RBC: 3.89 Mil/uL — ABNORMAL LOW (ref 4.22–5.81)
RDW: 23.8 % — ABNORMAL HIGH (ref 11.5–15.5)
WBC: 9 10*3/uL (ref 4.0–10.5)

## 2022-02-05 NOTE — Telephone Encounter (Signed)
Spoke with pt and let pt know about blood work. Pt verbalized understanding.

## 2022-02-05 NOTE — Telephone Encounter (Signed)
-----   Message from Marice Potter, RN sent at 01/29/2022  9:22 AM EST ----- Regarding: labs Pt needs CBC today. Order placed.

## 2022-02-06 ENCOUNTER — Telehealth: Payer: Self-pay | Admitting: Gastroenterology

## 2022-02-06 NOTE — Telephone Encounter (Signed)
Patient assistance forms faxed to [phone].

## 2022-02-06 NOTE — Telephone Encounter (Signed)
  [id number] sent paperwork to get patient in assistant program to get medication such as iv iron. Faxed paperwork and wants update

## 2022-02-06 NOTE — Telephone Encounter (Signed)
Mickel Baas, Thanks, I received them. Once patient is approved we will schedule patient for treatment Kim.

## 2022-02-07 NOTE — Telephone Encounter (Signed)
Spoke with pt and gave pt lab results and recommendations. See 1/2 CBC result note.

## 2022-02-07 NOTE — Telephone Encounter (Signed)
Patient returned call in regards to lab results  

## 2022-02-08 ENCOUNTER — Other Ambulatory Visit: Payer: Self-pay | Admitting: Nurse Practitioner

## 2022-02-08 ENCOUNTER — Other Ambulatory Visit: Payer: Self-pay

## 2022-02-08 ENCOUNTER — Encounter: Payer: Self-pay | Admitting: Gastroenterology

## 2022-02-08 DIAGNOSIS — M109 Gout, unspecified: Secondary | ICD-10-CM

## 2022-02-08 NOTE — Telephone Encounter (Signed)
Dr. Cathleen Corti, Gene Reed note:  Patient has been approved for PAP (free drug) and will be scheduled as soon as possible.

## 2022-02-11 ENCOUNTER — Ambulatory Visit (INDEPENDENT_AMBULATORY_CARE_PROVIDER_SITE_OTHER): Payer: Self-pay

## 2022-02-11 VITALS — BP 147/88 | HR 81 | Temp 98.1°F | Resp 18 | Ht 69.0 in | Wt 267.6 lb

## 2022-02-11 DIAGNOSIS — D649 Anemia, unspecified: Secondary | ICD-10-CM

## 2022-02-11 DIAGNOSIS — K922 Gastrointestinal hemorrhage, unspecified: Secondary | ICD-10-CM

## 2022-02-11 DIAGNOSIS — D500 Iron deficiency anemia secondary to blood loss (chronic): Secondary | ICD-10-CM

## 2022-02-11 MED ORDER — ACETAMINOPHEN 325 MG PO TABS
650.0000 mg | ORAL_TABLET | Freq: Once | ORAL | Status: AC
  Administered 2022-02-11: 650 mg via ORAL
  Filled 2022-02-11: qty 2

## 2022-02-11 MED ORDER — SODIUM CHLORIDE 0.9 % IV SOLN
510.0000 mg | Freq: Once | INTRAVENOUS | Status: AC
  Administered 2022-02-11: 510 mg via INTRAVENOUS
  Filled 2022-02-11: qty 17

## 2022-02-11 MED ORDER — DIPHENHYDRAMINE HCL 25 MG PO CAPS
25.0000 mg | ORAL_CAPSULE | Freq: Once | ORAL | Status: AC
  Administered 2022-02-11: 25 mg via ORAL
  Filled 2022-02-11: qty 1

## 2022-02-11 NOTE — Progress Notes (Signed)
Diagnosis: Iron Deficiency Anemia  Provider:  Marshell Garfinkel MD  Procedure: Infusion  IV Type: Peripheral, IV Location: L Hand  Feraheme (Ferumoxytol), Dose: 510 mg  Infusion Start Time: 5427  Infusion Stop Time: 0951  Post Infusion IV Care: Observation period completed and Peripheral IV Discontinued  Discharge: Condition: Good, Destination: Home . AVS provided to patient.   Performed by:  Adelina Mings, LPN

## 2022-02-12 ENCOUNTER — Telehealth: Payer: Self-pay | Admitting: Licensed Clinical Social Worker

## 2022-02-12 NOTE — Telephone Encounter (Signed)
H&V Care Navigation CSW Progress Note  Clinical Social Worker contacted patient by phone to f/u on assistance. Pt reached at [phone]; was screened ineligible for Medicaid at this time. We again discussed CAFA and how to apply. Pt interested. We reviewed needed documents and pt agreeable to me sending a new application to home address. I will f/u before appt at the end of the month to see if pt received application and remind him how to gather documents and return.   Patient is participating in a Managed Medicaid Plan:  No, self pay only   [place]: No Food Insecurity (01/24/2022)  Housing: Low Risk  (01/24/2022)  Transportation Needs: No Transportation Needs (01/24/2022)  Utilities: Not At Risk (01/24/2022)  Depression (PHQ2-9): Low Risk  (08/30/2021)  Financial Resource Strain: Medium Risk (12/12/2021)  Tobacco Use: Medium Risk (01/28/2022)    Westley Hummer, MSW, [place]  [phone]- work cell phone (preferred) [phone]- desk phone

## 2022-02-18 ENCOUNTER — Ambulatory Visit (INDEPENDENT_AMBULATORY_CARE_PROVIDER_SITE_OTHER): Payer: Self-pay

## 2022-02-18 VITALS — BP 167/84 | HR 71 | Temp 98.9°F | Resp 18 | Ht 69.0 in | Wt 267.0 lb

## 2022-02-18 DIAGNOSIS — D649 Anemia, unspecified: Secondary | ICD-10-CM

## 2022-02-18 DIAGNOSIS — K922 Gastrointestinal hemorrhage, unspecified: Secondary | ICD-10-CM

## 2022-02-18 DIAGNOSIS — D500 Iron deficiency anemia secondary to blood loss (chronic): Secondary | ICD-10-CM

## 2022-02-18 MED ORDER — DIPHENHYDRAMINE HCL 25 MG PO CAPS
25.0000 mg | ORAL_CAPSULE | Freq: Once | ORAL | Status: AC
  Administered 2022-02-18: 25 mg via ORAL
  Filled 2022-02-18: qty 1

## 2022-02-18 MED ORDER — ACETAMINOPHEN 325 MG PO TABS
650.0000 mg | ORAL_TABLET | Freq: Once | ORAL | Status: AC
  Administered 2022-02-18: 650 mg via ORAL
  Filled 2022-02-18: qty 2

## 2022-02-18 MED ORDER — SODIUM CHLORIDE 0.9 % IV SOLN
510.0000 mg | Freq: Once | INTRAVENOUS | Status: AC
  Administered 2022-02-18: 510 mg via INTRAVENOUS
  Filled 2022-02-18: qty 17

## 2022-02-18 NOTE — Progress Notes (Signed)
Diagnosis: Iron Deficiency Anemia  Provider:  Marshell Garfinkel MD  Procedure: Infusion  IV Type: Peripheral, IV Location: R Hand  Feraheme (Ferumoxytol), Dose: 510 mg  Infusion Start Time: 0177  Infusion Stop Time: 9390  Post Infusion IV Care: Peripheral IV Discontinued  Discharge: Condition: Good, Destination: Home . AVS provided to patient.   Performed by:  Arnoldo Morale, RN

## 2022-02-26 ENCOUNTER — Other Ambulatory Visit: Payer: Self-pay

## 2022-02-26 ENCOUNTER — Other Ambulatory Visit: Payer: Self-pay | Admitting: Nurse Practitioner

## 2022-02-26 ENCOUNTER — Encounter: Payer: Self-pay | Admitting: Gastroenterology

## 2022-02-26 MED ORDER — FERROUS SULFATE 325 (65 FE) MG PO TABS
325.0000 mg | ORAL_TABLET | ORAL | 0 refills | Status: DC
  Filled 2022-02-26 – 2022-04-03 (×2): qty 30, 60d supply, fill #0

## 2022-02-26 MED ORDER — PANTOPRAZOLE SODIUM 40 MG PO TBEC
40.0000 mg | DELAYED_RELEASE_TABLET | Freq: Two times a day (BID) | ORAL | 0 refills | Status: DC
  Filled 2022-02-26: qty 60, 30d supply, fill #0

## 2022-02-27 ENCOUNTER — Telehealth: Payer: Self-pay | Admitting: Licensed Clinical Social Worker

## 2022-02-27 NOTE — Telephone Encounter (Signed)
H&V Care Navigation CSW Progress Note  Clinical Social Worker contacted patient by phone to f/u on assistance applications. Reached pt at [phone], he shares that he has a stack of applications on his desk that he is working through. He will look for it and review paperwork, he can bring it to his PCP visit or to our appt next week. No additional questions or concerns noted by pt at this time- feeling very booked by all these appts. I remain available for questions before appt should they arise.   Patient is participating in a Managed Medicaid Plan:  No, self pay only, Family Planning Medicaid  [place]: No Food Insecurity (01/24/2022)  Housing: Low Risk  (01/24/2022)  Transportation Needs: No Transportation Needs (01/24/2022)  Utilities: Not At Risk (01/24/2022)  Depression (PHQ2-9): Low Risk  (08/30/2021)  Financial Resource Strain: Medium Risk (12/12/2021)  Tobacco Use: Medium Risk (01/28/2022)   Westley Hummer, MSW, [place]  [phone]- work cell phone (preferred) [phone]- desk phone

## 2022-03-01 ENCOUNTER — Ambulatory Visit: Payer: Medicaid Other | Admitting: Nurse Practitioner

## 2022-03-01 ENCOUNTER — Encounter: Payer: Self-pay | Admitting: Nurse Practitioner

## 2022-03-01 ENCOUNTER — Other Ambulatory Visit: Payer: Self-pay

## 2022-03-01 ENCOUNTER — Ambulatory Visit (INDEPENDENT_AMBULATORY_CARE_PROVIDER_SITE_OTHER): Payer: Self-pay | Admitting: Nurse Practitioner

## 2022-03-01 VITALS — BP 150/88 | HR 80 | Ht 68.5 in | Wt 269.0 lb

## 2022-03-01 DIAGNOSIS — K922 Gastrointestinal hemorrhage, unspecified: Secondary | ICD-10-CM

## 2022-03-01 DIAGNOSIS — M109 Gout, unspecified: Secondary | ICD-10-CM

## 2022-03-01 MED ORDER — ALLOPURINOL 100 MG PO TABS
100.0000 mg | ORAL_TABLET | Freq: Every day | ORAL | 3 refills | Status: DC
  Filled 2022-03-01: qty 90, fill #0
  Filled 2022-05-09: qty 90, 90d supply, fill #0
  Filled 2022-08-13: qty 90, 90d supply, fill #1
  Filled 2023-02-28: qty 90, 90d supply, fill #2

## 2022-03-01 MED ORDER — PANTOPRAZOLE SODIUM 40 MG PO TBEC
40.0000 mg | DELAYED_RELEASE_TABLET | Freq: Two times a day (BID) | ORAL | 2 refills | Status: DC
  Filled 2022-03-07: qty 60, 30d supply, fill #0

## 2022-03-01 NOTE — Progress Notes (Deleted)
Assessment    Patient profile:  Gene Reed is a 52 y.o. 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 Hospitalized late December 2023 with upper GI bleed on DAPT secondary to a small bowel AVM status post APC.   Hemoglobin improved from the 6 range to 10.1 post 2 units of blood.  Iron studies compatible with iron deficiency which wouldn't be expected with an acute bleed. Need to r/o chronic GI blood loss possibly from additional small bowel AVMs versus gastrointestinal neoplasm.  Celiac disease unlikely  Plan   Will schedule for a colonoscopy but Cardiology may have concerns about interruption of DAPT give PCI only a couple of months ago. The risks and benefits of colonoscopy with possible polypectomy / biopsies were discussed and the patient agrees to proceed.  CAD s/p PCI in November 2023 on DAPT. -- Hold Brillinta for 5 days before procedure - will instruct when and how to resume after procedure. Patient understands that there is a low but real risk of cardiovascular event such as heart attack, stroke, or embolism /  thrombosis, or ischemia while off Brillinta. The patient consents to proceed. Will communicate by phone or EMR with patient's prescribing provider to confirm that holding Brillinta is reasonable in this case.   HPI    Chief complaint: hospital follow up   We saw patient 01/23/2022 for hospital consultation for evaluation of symptomatic anemia and dark stools on DAPT  His hemoglobin was 6.8, down from baseline in the mid 12 range he received 2 units of blood.  Inpatient small bowel endoscopy was remarkable only for a single small angiectasia in the first portion of the duodenum treated with APC.  Posttransfusion hemoglobin improved to  10.1.  Iron studies compatible with iron deficiency  Interval History:      Previous GI Evaluation   EGD 01/23/22 remarkable for small amount of fresh blood in the first portion of the duodenum.  A single small angioectasia was found in the first portion of the duodenum.  Coagulation with APC was done given the presence of the small AVM we inserted the pediatric colonoscope for a deeper small bowel interrogation.  The duodenal bulb, D2, D3 and D4 were normal   Imaging     Labs:     Latest Ref Rng & Units 02/05/2022   10:16 AM 01/27/2022    8:00 PM 01/25/2022    3:59 AM  CBC  WBC 4.0 - 10.5 K/uL 9.0  13.2  11.5   Hemoglobin 13.0 - 17.0 g/dL 10.1  9.2  8.4   Hematocrit 39.0 - 52.0 % 30.4  28.2  25.7   Platelets 150.0 - 400.0 K/uL 305.0  296  277        Latest Ref Rng & Units 01/27/2022    8:00 PM 01/23/2022    3:28 AM 01/22/2022   12:50 PM  Hepatic Function  Total Protein 6.5 - 8.1 g/dL 7.7  7.3  7.3   Albumin 3.5 - 5.0 g/dL 3.9  3.7  3.6   AST 15 - 41 U/L 40  48  40   ALT 0 - 44 U/L 46  25  23   Alk Phosphatase 38 - 126 U/L 109  85  90   Total Bilirubin 0.3 - 1.2 mg/dL 0.8  1.8  1.0      Past Medical History:  Diagnosis Date   Adrenal nodule (HCC)    Arthritis    Bilateral chronic knee pain    Bone spur    CAD (coronary artery disease)    Gout    Hyperlipidemia    Hypertension     Past Surgical History:  Procedure Laterality Date   CORONARY LITHOTRIPSY N/A 12/06/2021   Procedure: CORONARY LITHOTRIPSY;  Surgeon: Troy Sine, MD;  Location: [place];  Service: Cardiovascular;  Laterality: N/A;   CORONARY STENT INTERVENTION Right 12/06/2021   Procedure: CORONARY STENT INTERVENTION;  Surgeon: Troy Sine, MD;  Location: [facility];  Service: Cardiovascular;  Laterality: Right;   ESOPHAGOGASTRODUODENOSCOPY Left 01/23/2022   Procedure: ESOPHAGOGASTRODUODENOSCOPY (EGD);  Surgeon: Lavena Bullion, DO;  Location: [facility] ENDOSCOPY;  Service:  Gastroenterology;  Laterality: Left;   HOT HEMOSTASIS N/A 01/23/2022   Procedure: HOT HEMOSTASIS (ARGON PLASMA COAGULATION/BICAP);  Surgeon: Lavena Bullion, DO;  Location: [facility] ENDOSCOPY;  Service: Gastroenterology;  Laterality: N/A;   KNEE SURGERY     LEFT HEART CATH AND CORONARY ANGIOGRAPHY N/A 12/06/2021   Procedure: LEFT HEART CATH AND CORONARY ANGIOGRAPHY;  Surgeon: Troy Sine, MD;  Location: [facility];  Service: Cardiovascular;  Laterality: N/A;    Current Medications, Allergies, Family History and Social History were reviewed in [employer] record.     Current Outpatient Medications  Medication Sig Dispense Refill   allopurinol (ZYLOPRIM) 100 MG tablet TAKE 1 TABLET (100 MG TOTAL) BY MOUTH DAILY. (Patient taking differently: Take 100 mg by mouth daily.) 90 tablet 3   aspirin EC 81 MG tablet Take 1 tablet (81 mg total) by mouth daily. Swallow whole. 90 tablet 3   carvedilol (COREG) 12.5 MG tablet Take 1 tablet (12.5 mg total) by mouth 2 (two) times daily. 180 tablet 3   ferrous sulfate (FEROSUL) 325 (65 FE) MG tablet Take 1 tablet (325 mg total) by mouth every other day. 30 tablet 0   nitroGLYCERIN (NITROSTAT) 0.4 MG SL tablet Place 1 tablet (0.4 mg total) under the tongue every 5 (five) minutes as needed for chest pain. 25 tablet 3   oxyCODONE-acetaminophen (PERCOCET/ROXICET) 5-325 MG tablet Take 1 tablet by mouth every 6 (six) hours as needed for severe pain. 15 tablet 0   pantoprazole (PROTONIX) 40 MG tablet Take 1 tablet (40 mg total) by mouth 2 (two) times daily. 60 tablet 0   rosuvastatin (CRESTOR) 40 MG tablet Take 1 tablet (40 mg total) by mouth daily. 30 tablet 5   ticagrelor (BRILINTA) 90 MG TABS tablet Take 1 tablet (90 mg total) by mouth 2 (two) times daily. 60 tablet 11   No current facility-administered medications for this visit.    Review of Systems: No chest pain. No shortness of breath. No urinary complaints.    Physical  Exam  Wt Readings from Last 3 Encounters:  02/18/22 267 lb (121.1 kg)  02/11/22 267 lb 9.6 oz (121.4 kg)  01/27/22 255 lb (115.7 kg)    There were no vitals taken for this visit. Constitutional:  Generally well appearing ***male in no acute distress. Psychiatric: Pleasant. Normal mood and affect. Behavior is normal. EENT: Pupils normal.  Conjunctivae are normal. No scleral icterus. Neck supple.  Cardiovascular: Normal rate, regular rhythm.  Pulmonary/chest: Effort normal and breath sounds normal. No wheezing, rales or rhonchi. Abdominal: Soft,  nondistended, nontender. Bowel sounds active throughout. There are no masses palpable. No hepatomegaly. Neurological: Alert and oriented to person place and time. Musculoskeletal: No edema Skin: Skin is warm and dry. No rashes noted.  Tye Savoy, NP  03/01/2022, 8:04 AM  Cc:  Fenton Foy, NP

## 2022-03-01 NOTE — Progress Notes (Signed)
@Patient  ID: Gene Reed, male    DOB: [birth year], 52 y.o.   MRN: [id number]  Chief Complaint  Patient presents with   Follow-up    Has questions about medications. Needs a dentist     Referring provider: Fenton Foy, NP   HPI  Patient presents today for a follow-up with chronic conditions.  He does need a refill on allopurinol.  Patient does need repeat blood work due to history of upper GI bleed.  Patient does need to see a dentist.  We will give him a list of area dentist so that he can call make an appointment. Denies f/c/s, n/v/d, hemoptysis, PND, leg swelling Denies chest pain or edema      Allergies  Allergen Reactions   Poison Ivy Extract Itching    Poison oak    Immunization History  Administered Date(s) Administered   PFIZER(Purple Top)SARS-COV-2 Vaccination 06/22/2019, 07/25/2019   Tdap 09/21/2015    Past Medical History:  Diagnosis Date   Adrenal nodule (HCC)    Arthritis    Bilateral chronic knee pain    Bone spur    CAD (coronary artery disease)    Gout    Hyperlipidemia    Hypertension     Tobacco History: Social History   Tobacco Use  Smoking Status Former  Smokeless Tobacco Never   Counseling given: Not Answered   Outpatient Encounter Medications as of 03/01/2022  Medication Sig   ferrous sulfate (FEROSUL) 325 (65 FE) MG tablet Take 1 tablet (325 mg total) by mouth every other day. (Patient not taking: Reported on 04/03/2022)   rosuvastatin (CRESTOR) 40 MG tablet Take 1 tablet (40 mg total) by mouth daily.   ticagrelor (BRILINTA) 90 MG TABS tablet Take 1 tablet (90 mg total) by mouth 2 (two) times daily.   [DISCONTINUED] allopurinol (ZYLOPRIM) 100 MG tablet TAKE 1 TABLET (100 MG TOTAL) BY MOUTH DAILY. (Patient taking differently: Take 100 mg by mouth daily.)   [DISCONTINUED] aspirin EC 81 MG tablet Take 1 tablet (81 mg total) by mouth daily. Swallow whole. (Patient not taking: Reported on 03/06/2022)   [DISCONTINUED] carvedilol  (COREG) 12.5 MG tablet Take 1 tablet (12.5 mg total) by mouth 2 (two) times daily.   [DISCONTINUED] pantoprazole (PROTONIX) 40 MG tablet Take 1 tablet (40 mg total) by mouth 2 (two) times daily.   allopurinol (ZYLOPRIM) 100 MG tablet Take 1 tablet (100 mg total) by mouth daily.   nitroGLYCERIN (NITROSTAT) 0.4 MG SL tablet Place 1 tablet (0.4 mg total) under the tongue every 5 (five) minutes as needed for chest pain. (Patient not taking: Reported on 03/01/2022)   [DISCONTINUED] oxyCODONE-acetaminophen (PERCOCET/ROXICET) 5-325 MG tablet Take 1 tablet by mouth every 6 (six) hours as needed for severe pain.   [DISCONTINUED] pantoprazole (PROTONIX) 40 MG tablet Take 1 tablet (40 mg total) by mouth 2 (two) times daily.   No facility-administered encounter medications on file as of 03/01/2022.     Review of Systems  Review of Systems  Constitutional: Negative.   HENT: Negative.    Cardiovascular: Negative.   Gastrointestinal: Negative.   Allergic/Immunologic: Negative.   Neurological: Negative.   Psychiatric/Behavioral: Negative.         Physical Exam  BP (!) 150/88   Pulse 80   Ht 5' 8.5" (1.74 m)   Wt 269 lb (122 kg)   SpO2 98%   BMI 40.31 kg/m   Wt Readings from Last 5 Encounters:  04/03/22 280 lb 3.2 oz (127.1 kg)  04/03/22 279 lb (126.6 kg)  03/08/22 271 lb (122.9 kg)  03/06/22 275 lb (124.7 kg)  03/01/22 269 lb (122 kg)     Physical Exam Vitals and nursing note reviewed.  Constitutional:      General: He is not in acute distress.    Appearance: He is well-developed.  Cardiovascular:     Rate and Rhythm: Normal rate and regular rhythm.  Pulmonary:     Effort: Pulmonary effort is normal.     Breath sounds: Normal breath sounds.  Skin:    General: Skin is warm and dry.  Neurological:     Mental Status: He is alert and oriented to person, place, and time.      Lab Results:  CBC    Component Value Date/Time   WBC 10.1 04/03/2022 1401   WBC 10.2 03/19/2022  1214   RBC 4.67 04/03/2022 1401   RBC 4.57 03/19/2022 1214   HGB 13.1 04/03/2022 1401   HCT 39.9 04/03/2022 1401   PLT 215 04/03/2022 1401   MCV 85 04/03/2022 1401   MCH 28.1 04/03/2022 1401   MCH 25.6 (L) 01/27/2022 2000   MCHC 32.8 04/03/2022 1401   MCHC 34.6 03/19/2022 1214   RDW 19.1 (H) 04/03/2022 1401   LYMPHSABS 2.8 02/05/2022 1016   LYMPHSABS 3.0 08/13/2019 0936   MONOABS 0.7 02/05/2022 1016   EOSABS 0.2 02/05/2022 1016   EOSABS 0.3 08/13/2019 0936   BASOSABS 0.1 02/05/2022 1016   BASOSABS 0.1 08/13/2019 0936    BMET    Component Value Date/Time   NA 138 04/03/2022 1401   K 4.2 04/03/2022 1401   CL 102 04/03/2022 1401   CO2 20 04/03/2022 1401   GLUCOSE 114 (H) 04/03/2022 1401   GLUCOSE 104 (H) 01/27/2022 2000   BUN 9 04/03/2022 1401   CREATININE 0.72 (L) 04/03/2022 1401   CREATININE 0.66 11/20/2016 1051   CALCIUM 9.2 04/03/2022 1401   GFRNONAA >60 01/27/2022 2000   GFRNONAA 117 11/20/2016 1051   GFRAA 133 11/12/2019 0925   GFRAA 135 11/20/2016 1051    BNP No results found for: "BNP"  ProBNP No results found for: "PROBNP"  Imaging: US ABDOMEN LIMITED RUQ (LIVER/GB)  Result Date: 04/15/2022 CLINICAL DATA:  Abnormal liver function EXAM: ULTRASOUND ABDOMEN LIMITED RIGHT UPPER QUADRANT COMPARISON:  None Available. FINDINGS: Gallbladder: Poorly distended with no obvious abnormalities. Common bile duct: Diameter: 2.6 mm Liver: Diffuse increased echogenicity throughout the liver. No liver mass identified. Portal vein is patent on color Doppler imaging with normal direction of blood flow towards the liver. Other: None. IMPRESSION: 1. Diffuse increased echogenicity throughout the liver is nonspecific and may be seen with hepatic steatosis or other underlying intrinsic liver disease. 2. No other abnormalities identified. Electronically Signed   By: Dorise Bullion III M.D.   On: 04/15/2022 10:29   LONG TERM MONITOR-LIVE TELEMETRY (3-14 DAYS)  Result Date:  04/14/2022 Patch Wear Time:  5 days and 0 hours (2024-01-31T09:35:24-0500 to 2024-02-05T10:34:02-498) Patient had a min HR of 55 bpm, max HR of 158 bpm, and avg HR of 83 bpm. Predominant underlying rhythm was Sinus Rhythm. Bundle Branch Block/IVCD was present. QRS morphology changes were present throughout recording. 4 Supraventricular Tachycardia runs occurred, the run with the fastest interval lasting 6 beats with a max rate of 158 bpm, the longest lasting 9 beats with an avg rate of 121 bpm. Isolated SVEs were rare (<1.0%), SVE Couplets were rare (<1.0%), and SVE Triplets were rare (<1.0%). Isolated  VEs were rare (<  1.0%), and no VE Couplets or VE Triplets were present. The predominant rhythm was normal sinus rhythm at 83 bpm with rate range from 55 to 115 bpm.  The slowest heart rate at 55 bpm which occurred while sleeping at 2:56 AM on January 31.  There were 4 brief episodes of SVT with the fastest interval at 6 beats, average heart rate 145 but maximum 158 and the longest lasting 9 beats with an average rate of 121 bpm.  QRS complex consistent with IVCD.  There were rare isolated PACs and atrial couplets and isolated PVCs.  There were no pauses.  There were no episodes of atrial fibrillation.     Assessment & Plan:   Upper GI bleed - CBC - Comprehensive metabolic panel   2. Acute gout of foot, unspecified cause, unspecified laterality  - allopurinol (ZYLOPRIM) 100 MG tablet; TAKE 1 TABLET (100 MG TOTAL) BY MOUTH DAILY.  Dispense: 90 tablet; Refill: 3   Follow up:  Follow up in 3 months     Fenton Foy, NP 04/22/2022

## 2022-03-01 NOTE — Patient Instructions (Signed)
1. Upper GI bleed  - CBC - Comprehensive metabolic panel   2. Acute gout of foot, unspecified cause, unspecified laterality  - allopurinol (ZYLOPRIM) 100 MG tablet; TAKE 1 TABLET (100 MG TOTAL) BY MOUTH DAILY.  Dispense: 90 tablet; Refill: 3   Follow up:  Follow up in 3 months

## 2022-03-02 LAB — COMPREHENSIVE METABOLIC PANEL
ALT: 35 IU/L (ref 0–44)
AST: 47 IU/L — ABNORMAL HIGH (ref 0–40)
Albumin/Globulin Ratio: 1.4 (ref 1.2–2.2)
Albumin: 4.7 g/dL (ref 4.1–5.1)
Alkaline Phosphatase: 150 IU/L — ABNORMAL HIGH (ref 44–121)
BUN/Creatinine Ratio: 17 (ref 9–20)
BUN: 13 mg/dL (ref 6–24)
Bilirubin Total: 0.7 mg/dL (ref 0.0–1.2)
CO2: 18 mmol/L — ABNORMAL LOW (ref 20–29)
Calcium: 9.2 mg/dL (ref 8.7–10.2)
Chloride: 104 mmol/L (ref 96–106)
Creatinine, Ser: 0.77 mg/dL (ref 0.76–1.27)
Globulin, Total: 3.3 g/dL (ref 1.5–4.5)
Glucose: 102 mg/dL — ABNORMAL HIGH (ref 70–99)
Potassium: 4.1 mmol/L (ref 3.5–5.2)
Sodium: 140 mmol/L (ref 134–144)
Total Protein: 8 g/dL (ref 6.0–8.5)
eGFR: 109 mL/min/{1.73_m2} (ref >59)

## 2022-03-02 LAB — CBC
Hematocrit: 38.1 % (ref 37.5–51.0)
Hemoglobin: 12.3 g/dL — ABNORMAL LOW (ref 13.0–17.7)
MCH: 26.6 pg (ref 26.6–33.0)
MCHC: 32.3 g/dL (ref 31.5–35.7)
MCV: 83 fL (ref 79–97)
Platelets: 209 10*3/uL (ref 150–450)
RBC: 4.62 x10E6/uL (ref 4.14–5.80)
RDW: 22.8 % — ABNORMAL HIGH (ref 11.6–15.4)
WBC: 9.4 10*3/uL (ref 3.4–10.8)

## 2022-03-04 ENCOUNTER — Other Ambulatory Visit: Payer: Self-pay

## 2022-03-06 ENCOUNTER — Encounter: Payer: Self-pay | Admitting: Physician Assistant

## 2022-03-06 ENCOUNTER — Other Ambulatory Visit: Payer: Self-pay

## 2022-03-06 ENCOUNTER — Ambulatory Visit: Payer: Medicaid Other | Attending: Physician Assistant | Admitting: Physician Assistant

## 2022-03-06 ENCOUNTER — Encounter: Payer: Self-pay | Admitting: Gastroenterology

## 2022-03-06 VITALS — BP 160/100 | HR 78 | Ht 69.0 in | Wt 275.0 lb

## 2022-03-06 DIAGNOSIS — E785 Hyperlipidemia, unspecified: Secondary | ICD-10-CM | POA: Insufficient documentation

## 2022-03-06 DIAGNOSIS — S8992XA Unspecified injury of left lower leg, initial encounter: Secondary | ICD-10-CM | POA: Diagnosis not present

## 2022-03-06 DIAGNOSIS — R55 Syncope and collapse: Secondary | ICD-10-CM

## 2022-03-06 DIAGNOSIS — I10 Essential (primary) hypertension: Secondary | ICD-10-CM | POA: Insufficient documentation

## 2022-03-06 DIAGNOSIS — D649 Anemia, unspecified: Secondary | ICD-10-CM | POA: Insufficient documentation

## 2022-03-06 DIAGNOSIS — I2510 Atherosclerotic heart disease of native coronary artery without angina pectoris: Secondary | ICD-10-CM | POA: Insufficient documentation

## 2022-03-06 MED ORDER — CARVEDILOL 25 MG PO TABS
25.0000 mg | ORAL_TABLET | Freq: Two times a day (BID) | ORAL | 3 refills | Status: DC
  Filled 2022-03-06: qty 180, 90d supply, fill #0
  Filled 2022-06-28: qty 180, 90d supply, fill #1
  Filled 2022-09-26: qty 180, 90d supply, fill #2
  Filled 2023-02-28: qty 180, 90d supply, fill #3

## 2022-03-06 NOTE — Progress Notes (Signed)
Cardiology Office Note:    Date:  03/06/2022   ID:  Gene Reed, DOB [birth year], MRN [id number]  PCP:  Ivonne Andrew, NP   [facility] Providers Cardiologist:  Nicki Guadalajara, MD     Referring MD: Ivonne Andrew, NP   Chief Complaint  Patient presents with   Follow-up    Seen for Dr. Tresa Endo    History of Present Illness:    Gene Reed is a 52 y.o. male with a hx of CAD, HTN, HLD, adrenal nodule noted on CTA 11/2021, GERD, gout, OSA and chronic knee pain.  Patient was initially referred to Dr. Tresa Endo August 2023 for evaluation of chest pain.  The chest discomfort sound rather atypical and musculoskeletal in nature.  Coronary calcium score and echocardiogram were ordered.  Echocardiogram showed EF 65 to 70%, no regional wall motion abnormality, mild LVH, normal diastolic parameters, mild MR.  Coronary calcium score come back elevated at 1803 which placed the patient in 99th percentile for age and sex matched control.  Coronary CT was ordered which showed possible severe proximal RCA stenosis and moderate LAD and left circumflex stenosis, FFR was unable to be processed.  CTA also revealed incidentally of 3 cm left adrenal nodule for which additional imaging was recommended.  He was seen in the cardiology office and eventually set up for cardiac catheterization on 12/06/2021 which revealed a 15% stenosis in mid to distal left main, 90% stenosis in proximal RCA followed by 20% stenosis in proximal to mid RCA, 40% stenosis in proximal to mid LAD followed by 75% and 40% stenosis in mid LAD, 20% stenosis in proximal left circumflex artery and a 20% OM 2.  He underwent difficult but successful PCI to the severely calcified proximal RCA with PTCA, scoring balloon and ultimately insertion of a drug-eluting stent.  LAD disease was treated medically with plans for staged PCI to LAD if he has recurrent angina.  Postprocedure, he was placed on aspirin and Brilinta as well as Imdur.  He was  supposed to discuss with his PCP regarding the adrenal nodule.  His CPAP machine was managed by Dr. Tresa Endo.   I last saw the patient on 01/21/2022 for evaluation of syncope that occurred while he was at the barbershop.  I recommended a lab work including CBC, basic metabolic panel, TSH and an 2-week heart monitor.  He also appears to be orthostatic in the office with blood pressure dropping from 99 down to 68 systolic.  I asked him to hydrate himself.  His CBC came back showing severe anemia with hemoglobin of 6.5.  I ended up sending the patient to the hospital.  His aspirin and Brilinta were temporarily held.  He underwent EGD that showed a small angiectasia treated with APC.  He received 2 units of packed red blood cell.  He received IV iron therapy.  Both aspirin and Brilinta were started after endoscopy.  Patient presents today for follow-up.  He was initially doing well after discharge, however in the past few weeks, he has been feeling fatigued.  His blood pressure is elevated today, even on manual recheck, his blood pressure remain 160/100.  He says his blood pressure has also been high at home.  I will increase his carvedilol to 25 mg twice a day.  He is noticing recurrent dark discoloration in the stool, he is worried that his GI bleeding might be coming back.  He has discontinued aspirin and currently on Brilinta monotherapy due to  concern for GI bleed.  He was seen by internal medicine service 5 days ago, blood work obtained at the time showed hemoglobin was 12.  I will repeat a CBC today to make sure that hemoglobin is now going down.  He bumped his left shin on a heater and has been limping since.  I will obtain left tibia/fibula x-ray to make sure there is no fracture.  We will see the patient in 6 weeks for close outpatient follow-up and blood pressure medication titration.Marland Kitchen  He missed his appointment to see GI service a few days ago and that has been rescheduled for February.   Past Medical  History:  Diagnosis Date   Adrenal nodule (HCC)    Arthritis    Bilateral chronic knee pain    Bone spur    CAD (coronary artery disease)    Gout    Hyperlipidemia    Hypertension     Past Surgical History:  Procedure Laterality Date   CORONARY LITHOTRIPSY N/A 12/06/2021   Procedure: CORONARY LITHOTRIPSY;  Surgeon: Troy Sine, MD;  Location: [place] [facility];  Service: Cardiovascular;  Laterality: N/A;   CORONARY STENT INTERVENTION Right 12/06/2021   Procedure: CORONARY STENT INTERVENTION;  Surgeon: Troy Sine, MD;  Location: [place] [facility];  Service: Cardiovascular;  Laterality: Right;   ESOPHAGOGASTRODUODENOSCOPY Left 01/23/2022   Procedure: ESOPHAGOGASTRODUODENOSCOPY (EGD);  Surgeon: Lavena Bullion, DO;  Location: [facility];  Service: Gastroenterology;  Laterality: Left;   HOT HEMOSTASIS N/A 01/23/2022   Procedure: HOT HEMOSTASIS (ARGON PLASMA COAGULATION/BICAP);  Surgeon: Lavena Bullion, DO;  Location: [facility] ENDOSCOPY;  Service: Gastroenterology;  Laterality: N/A;   KNEE SURGERY     LEFT HEART CATH AND CORONARY ANGIOGRAPHY N/A 12/06/2021   Procedure: LEFT HEART CATH AND CORONARY ANGIOGRAPHY;  Surgeon: Troy Sine, MD;  Location: [place] [facility];  Service: Cardiovascular;  Laterality: N/A;    Current Medications: Current Meds  Medication Sig   allopurinol (ZYLOPRIM) 100 MG tablet Take 1 tablet (100 mg total) by mouth daily.   ferrous sulfate (FEROSUL) 325 (65 FE) MG tablet Take 1 tablet (325 mg total) by mouth every other day.   oxyCODONE-acetaminophen (PERCOCET/ROXICET) 5-325 MG tablet Take 1 tablet by mouth every 6 (six) hours as needed for severe pain.   pantoprazole (PROTONIX) 40 MG tablet Take 1 tablet (40 mg total) by mouth 2 (two) times daily.   rosuvastatin (CRESTOR) 40 MG tablet Take 1 tablet (40 mg total) by mouth daily.   ticagrelor (BRILINTA) 90 MG TABS tablet Take 1 tablet (90 mg total) by mouth 2 (two) times daily.   [DISCONTINUED]  carvedilol (COREG) 12.5 MG tablet Take 1 tablet (12.5 mg total) by mouth 2 (two) times daily.     Allergies:   Poison ivy extract   Social History   Socioeconomic History   Marital status: Widowed    Spouse name: Not on file   Number of children: Not on file   Years of education: Not on file   Highest education level: Not on file  Occupational History   Not on file  Tobacco Use   Smoking status: Former   Smokeless tobacco: Never  Vaping Use   Vaping Use: Never used  Substance and Sexual Activity   Alcohol use: Yes    Comment:  6 beers per a day    Drug use: Yes    Types: Marijuana    Comment: 1 joint a day    Sexual activity: Not  Currently  Other Topics Concern   Not on file  Social History Narrative   Not on file   Social Determinants of Health   Financial Resource Strain: Medium Risk (12/12/2021)   Overall Financial Resource Strain (CARDIA)    Difficulty of Paying Living Expenses: Somewhat hard  Food Insecurity: No Food Insecurity (01/24/2022)   Hunger Vital Sign    Worried About Running Out of Food in the Last Year: Never true    Ran Out of Food in the Last Year: Never true  Transportation Needs: No Transportation Needs (01/24/2022)   PRAPARE - Administrator, Civil Service (Medical): No    Lack of Transportation (Non-Medical): No  Physical Activity: Not on file  Stress: Not on file  Social Connections: Not on file     Family History: The patient's family history is not on file.  ROS:   Please see the history of present illness.     All other systems reviewed and are negative.  EKGs/Labs/Other Studies Reviewed:    The following studies were reviewed today:  Echo 09/28/2021  1. Left ventricular ejection fraction, by estimation, is 65 to 70%. The  left ventricle has normal function. The left ventricle has no regional  wall motion abnormalities. There is mild left ventricular hypertrophy.  Left ventricular diastolic parameters  were normal.    2. Right ventricular systolic function is normal. The right ventricular  size is normal.   3. The mitral valve is normal in structure. Mild mitral valve  regurgitation.   4. The aortic valve is tricuspid. Aortic valve regurgitation is not  visualized. Aortic valve sclerosis/calcification is present, without any  evidence of aortic stenosis.   5. The inferior vena cava is normal in size with greater than 50%  respiratory variability, suggesting right atrial pressure of 3 mmHg.    Cath 12/06/2021   Prox RCA lesion is 90% stenosed.   Prox RCA to Mid RCA lesion is 20% stenosed.   Mid LM to Dist LM lesion is 15% stenosed.   Prox LAD to Mid LAD lesion is 40% stenosed.   Mid LAD-1 lesion is 75% stenosed.   Mid LAD-2 lesion is 40% stenosed.   Prox Cx lesion is 20% stenosed.   2nd Mrg lesion is 20% stenosed.   A drug-eluting stent was successfully placed.   Post intervention, there is a 0% residual stenosis.   Multivessel CAD with severely calcified proximal RCA stenosis of 90% followed by mid 20% stenosis in a dominant RCA which supplies the LV apex.   Diffusely calcified proximal LAD stenoses of 40% 75% and 40%.   Mild nonobstructive disease in the distal left main and left circumflex vessel.   LVEDP 21 mmHg.   Difficult but successful PCI to the severely calcified proximal RCA with PTCA, scoring balloon, and intravascular lithotripsy and ultimate insertion of a 3.5 x 20 mm Synergy DES stent postdilated to 3.75 mm with the stenosis being reduced to 0% and successful lithotripsy with resolution of previous significant calcification.   RECOMMENDATION: DAPT for minimum of 12 months probably longer.  Increase medical therapy for concomitant LAD disease.  If patient has recurrent symptomatology will need staged PCI to LAD.    EKG:  EKG is not ordered today.    Recent Labs: 01/21/2022: TSH 2.320 03/01/2022: ALT 35; BUN 13; Creatinine, Ser 0.77; Hemoglobin 12.3; Platelets 209; Potassium  4.1; Sodium 140  Recent Lipid Panel    Component Value Date/Time   CHOL 181 04/06/2021 1058  TRIG 245 (H) 04/06/2021 1058   HDL 42 04/06/2021 1058   CHOLHDL 4.3 04/06/2021 1058   LDLCALC 97 04/06/2021 1058     Risk Assessment/Calculations:           Physical Exam:    VS:  BP (!) 160/100   Pulse 78   Ht 5\' 9"  (1.753 m)   Wt 275 lb (124.7 kg) Comment: wear steele toe boots  SpO2 97%   BMI 40.61 kg/m     HYPERTENSION CONTROL Vitals:   03/06/22 0814 03/06/22 2353  BP: (!) 169/91 (!) 160/100    The patient's blood pressure is elevated above target today.  In order to address the patient's elevated BP: A current anti-hypertensive medication was adjusted today.; Blood pressure will be monitored at home to determine if medication changes need to be made.      Wt Readings from Last 3 Encounters:  03/06/22 275 lb (124.7 kg)  03/01/22 269 lb (122 kg)  02/18/22 267 lb (121.1 kg)     GEN:  Well nourished, well developed in no acute distress HEENT: Normal NECK: No JVD; No carotid bruits LYMPHATICS: No lymphadenopathy CARDIAC: RRR, no murmurs, rubs, gallops RESPIRATORY:  Clear to auscultation without rales, wheezing or rhonchi  ABDOMEN: Soft, non-tender, non-distended MUSCULOSKELETAL:  No edema; No deformity  SKIN: Warm and dry NEUROLOGIC:  Alert and oriented x 3 PSYCHIATRIC:  Normal affect   ASSESSMENT:    1. Leg injury, left, initial encounter   2. Anemia, unspecified type   3. Coronary artery disease involving native coronary artery of native heart without angina pectoris   4. Essential hypertension   5. Hyperlipidemia LDL goal <70    PLAN:    In order of problems listed above:  Left shin injury: He kicked his shin on a heater and has significant bruising around the area.  He has been limping around.  I will obtain an x-ray to make sure there is no fracture.  Anemia: I previously saw the patient in December and he had severe anemia and was sent to the  hospital.  Endoscopy at the time showed small angiectasia treated with APC.  He required blood transfusion.  He has been noticing darker and darker stools recently, he has discontinued aspirin and is still on Brilinta monotherapy.  I will obtain CBC today to make sure hemoglobin is not going down.  He will need to see GI service soon  CAD: Recently underwent DES to proximal RCA in November 2023.  He has discontinued aspirin and currently on Brilinta monotherapy due to concern for recurrent GI bleed.  He has been having dark stools.  He will need to follow with GI service closely.  We will obtain CBC today.  Hopefully we can restart aspirin in the future.  Hypertension: Blood pressure is elevated, increase carvedilol to 25 mg twice a day  Hyperlipidemia: On rosuvastatin.           Medication Adjustments/Labs and Tests Ordered: Current medicines are reviewed at length with the patient today.  Concerns regarding medicines are outlined above.  Orders Placed This Encounter  Procedures   DG Tibia/Fibula Left   CBC   Meds ordered this encounter  Medications   carvedilol (COREG) 25 MG tablet    Sig: Take 1 tablet (25 mg total) by mouth 2 (two) times daily.    Dispense:  180 tablet    Refill:  3    Dose change new Rx    Patient Instructions  Medication Instructions:  INCREASE Carvedilol (Coreg) to 25 mg 2 times a day   *If you need a refill on your cardiac medications before your next appointment, please call your pharmacy*  Lab Work: Your physician recommends that you ret urn for lab work TODAY:  CBC If you have labs (blood work) drawn today and your tests are completely normal, you will receive your results only by: MyChart Message (if you have MyChart) OR A paper copy in the mail If you have any lab test that is abnormal or we need to change your treatment, we will call you to review the results.  Testing/Procedures: Almyra Deforest, PA-C has recommend that you have a X-ray of your  leg. This test is performed at [place], [place] [address]   Follow-Up: At [facility], you and your health needs are our priority.  As part of our continuing mission to provide you with exceptional heart care, we have created designated Provider Care Teams.  These Care Teams include your primary Cardiologist (physician) and Advanced Practice Providers (APPs -  Physician Assistants and Nurse Practitioners) who all work together to provide you with the care you need, when you need it.    Your next appointment:   6 week(s)  Provider:   APP        Other Instructions    Signed, Almyra Deforest, [place]  03/06/2022 11:54 PM    [place]

## 2022-03-06 NOTE — Patient Instructions (Signed)
Medication Instructions:  INCREASE Carvedilol (Coreg) to 25 mg 2 times a day   *If you need a refill on your cardiac medications before your next appointment, please call your pharmacy*  Lab Work: Your physician recommends that you ret urn for lab work TODAY:  CBC If you have labs (blood work) drawn today and your tests are completely normal, you will receive your results only by: [place] (if you have MyChart) OR A paper copy in the mail If you have any lab test that is abnormal or we need to change your treatment, we will call you to review the results.  Testing/Procedures: Almyra Deforest, PA-C has recommend that you have a X-ray of your leg. This test is performed at [place], [place] [address]   Follow-Up: At [facility], you and your health needs are our priority.  As part of our continuing mission to provide you with exceptional heart care, we have created designated Provider Care Teams.  These Care Teams include your primary Cardiologist (physician) and Advanced Practice Providers (APPs -  Physician Assistants and Nurse Practitioners) who all work together to provide you with the care you need, when you need it.    Your next appointment:   6 week(s)  Provider:   APP        Other Instructions

## 2022-03-07 ENCOUNTER — Ambulatory Visit
Admission: RE | Admit: 2022-03-07 | Discharge: 2022-03-07 | Disposition: A | Discharge status: Home / Self Care | Payer: Self-pay | Source: Ambulatory Visit | Attending: Physician Assistant | Admitting: Physician Assistant

## 2022-03-07 ENCOUNTER — Telehealth: Payer: Self-pay

## 2022-03-07 ENCOUNTER — Telehealth: Payer: Self-pay | Admitting: Licensed Clinical Social Worker

## 2022-03-07 ENCOUNTER — Other Ambulatory Visit: Payer: Self-pay

## 2022-03-07 ENCOUNTER — Encounter: Payer: Self-pay | Admitting: Gastroenterology

## 2022-03-07 DIAGNOSIS — S8992XA Unspecified injury of left lower leg, initial encounter: Secondary | ICD-10-CM

## 2022-03-07 LAB — CBC
Hematocrit: 39.5 % (ref 37.5–51.0)
Hemoglobin: 12.8 g/dL — ABNORMAL LOW (ref 13.0–17.7)
MCH: 27.5 pg (ref 26.6–33.0)
MCHC: 32.4 g/dL (ref 31.5–35.7)
MCV: 85 fL (ref 79–97)
Platelets: 230 10*3/uL (ref 150–450)
RBC: 4.65 x10E6/uL (ref 4.14–5.80)
RDW: 22.6 % — ABNORMAL HIGH (ref 11.6–15.4)
WBC: 8.8 10*3/uL (ref 3.4–10.8)

## 2022-03-07 NOTE — Telephone Encounter (Signed)
Pt came to office stating he needed an appointment because he is having black stools again. Pt had CBC yesterday and Hemoglobin was 12.8. pt scheduled for office visit tomorrow 03/08/22 with Alonza Bogus. Pt verbalized understanding and had no other concerns.

## 2022-03-07 NOTE — Telephone Encounter (Signed)
H&V Care Navigation CSW Progress Note  Clinical Social Worker contacted patient by phone to f/u again as resources were resent to pt but not brought back during either PCP or Heartcare appt. Pt shares he has a stack of paperwork that he would sort through and see if received/call me at that time. I will f/u one more time. Pt has been contacted multiple times but not been able to engage further as pt has not started working on applications provided. No additional questions/concerns at this time.   Patient is participating in a Managed Medicaid Plan:  No, self pay, Medicaid Family Planning.   SDOH Screenings   Food Insecurity: No Food Insecurity (01/24/2022)  Housing: Low Risk  (01/24/2022)  Transportation Needs: No Transportation Needs (01/24/2022)  Utilities: Not At Risk (01/24/2022)  Depression (PHQ2-9): Low Risk  (08/30/2021)  Financial Resource Strain: Medium Risk (12/12/2021)  Tobacco Use: Medium Risk (03/06/2022)   Westley Hummer, MSW, [place]  [phone]- work cell phone (preferred) [phone]- desk phone

## 2022-03-08 ENCOUNTER — Encounter: Payer: Self-pay | Admitting: Gastroenterology

## 2022-03-08 ENCOUNTER — Ambulatory Visit (INDEPENDENT_AMBULATORY_CARE_PROVIDER_SITE_OTHER): Payer: Self-pay | Admitting: Gastroenterology

## 2022-03-08 ENCOUNTER — Other Ambulatory Visit: Payer: Self-pay

## 2022-03-08 VITALS — BP 160/90 | HR 73 | Ht 69.0 in | Wt 271.0 lb

## 2022-03-08 DIAGNOSIS — K921 Melena: Secondary | ICD-10-CM

## 2022-03-08 DIAGNOSIS — R1012 Left upper quadrant pain: Secondary | ICD-10-CM

## 2022-03-08 DIAGNOSIS — K219 Gastro-esophageal reflux disease without esophagitis: Secondary | ICD-10-CM

## 2022-03-08 MED ORDER — OMEPRAZOLE 40 MG PO CPDR
40.0000 mg | DELAYED_RELEASE_CAPSULE | Freq: Every day | ORAL | 3 refills | Status: DC
  Filled 2022-03-08: qty 90, 90d supply, fill #0

## 2022-03-08 NOTE — Progress Notes (Signed)
03/08/2022 Gene Reed [id number] 1970/07/12   HISTORY OF PRESENT ILLNESS: This is a 52 year old male who is a patient of Dr. Vivia Ewing as he was seen by him as an inpatient back in December.  He was evaluated for symptomatic microscopic anemia with melena as inpatient in December.  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  He is here today with ongoing complaints of black stools.  He is on oral iron supplementation.  His hemoglobin has been trending up and was 12.8 g just 2 days ago.  He says that he has been off of his PPI therapy as he ran out of pantoprazole.  Has been noticing some intermittent left upper quadrant abdominal discomfort as well.  Also has coronary artery disease with DES to the RCA in November 2023.  On Brilinta.  Currently wearing cardiac monitor for 2 weeks as well.  Past Medical History:  Diagnosis Date   Adrenal nodule (HCC)    Arthritis    Bilateral chronic knee pain    Bone spur    CAD (coronary artery disease)    Gout    Hyperlipidemia    Hypertension    Past Surgical History:  Procedure Laterality Date   CORONARY LITHOTRIPSY N/A 12/06/2021   Procedure: CORONARY LITHOTRIPSY;  Surgeon: Troy Sine, MD;  Location: [place] [facility];  Service: Cardiovascular;  Laterality: N/A;   CORONARY STENT INTERVENTION Right 12/06/2021   Procedure: CORONARY STENT INTERVENTION;  Surgeon: Troy Sine, MD;  Location: [place] [facility];  Service: Cardiovascular;  Laterality: Right;   ESOPHAGOGASTRODUODENOSCOPY Left 01/23/2022   Procedure: ESOPHAGOGASTRODUODENOSCOPY (EGD);  Surgeon: Lavena Bullion, DO;  Location: [facility] ENDOSCOPY;  Service: Gastroenterology;  Laterality: Left;   HOT HEMOSTASIS N/A 01/23/2022   Procedure: HOT HEMOSTASIS (ARGON PLASMA COAGULATION/BICAP);  Surgeon: Lavena Bullion, DO;  Location: [facility] ENDOSCOPY;  Service: Gastroenterology;  Laterality: N/A;   KNEE SURGERY     LEFT HEART CATH AND CORONARY  ANGIOGRAPHY N/A 12/06/2021   Procedure: LEFT HEART CATH AND CORONARY ANGIOGRAPHY;  Surgeon: Troy Sine, MD;  Location: [place] CV LAB;  Service: Cardiovascular;  Laterality: N/A;    reports that he has quit smoking. He has never used smokeless tobacco. He reports current alcohol use. He reports current drug use. Drug: Marijuana. family history is not on file. Allergies  Allergen Reactions   Poison Ivy Extract Itching    Poison oak      Outpatient Encounter Medications as of 03/08/2022  Medication Sig   allopurinol (ZYLOPRIM) 100 MG tablet Take 1 tablet (100 mg total) by mouth daily.   carvedilol (COREG) 25 MG tablet Take 1 tablet (25 mg total) by mouth 2 (two) times daily.   ferrous sulfate (FEROSUL) 325 (65 FE) MG tablet Take 1 tablet (325 mg total) by mouth every other day.   pantoprazole (PROTONIX) 40 MG tablet Take 1 tablet (40 mg total) by mouth 2 (two) times daily.   rosuvastatin (CRESTOR) 40 MG tablet Take 1 tablet (40 mg total) by mouth daily.   ticagrelor (BRILINTA) 90 MG TABS tablet Take 1 tablet (90 mg total) by mouth 2 (two) times daily.   nitroGLYCERIN (NITROSTAT) 0.4 MG SL tablet Place 1 tablet (0.4 mg total) under the tongue every 5 (five) minutes as needed for chest pain. (Patient not taking: Reported on 03/01/2022)   [DISCONTINUED] aspirin EC 81 MG tablet Take 1 tablet (81 mg total) by mouth daily. Swallow whole. (Patient not taking:  Reported on 03/06/2022)   [DISCONTINUED] oxyCODONE-acetaminophen (PERCOCET/ROXICET) 5-325 MG tablet Take 1 tablet by mouth every 6 (six) hours as needed for severe pain.   No facility-administered encounter medications on file as of 03/08/2022.     REVIEW OF SYSTEMS  : All other systems reviewed and negative except where noted in the History of Present Illness.   PHYSICAL EXAM: BP (!) 160/90 (BP Location: Left Arm, Patient Position: Sitting)   Pulse 73   Ht 5\' 9"  (1.753 m)   Wt 271 lb (122.9 kg)   SpO2 97%   BMI 40.02 kg/m   General: Well developed male in no acute distress Head: Normocephalic and atraumatic Eyes:  Sclerae anicteric, conjunctiva pink. Ears: Normal auditory acuity Lungs: Clear throughout to auscultation; no W/R/R. Heart: Regular rate and rhythm; no M/R/G. Abdomen: Soft, non-distended.  BS present.  Non-tender. Rectal:  No external abnormalities noted.  DRE revealed very dark greenish stool that was hemoccult negative. Musculoskeletal: Symmetrical with no gross deformities  Skin: No lesions on visible extremities Extremities: No edema  Neurological: Alert oriented x 4, grossly non-focal Psychological:  Alert and cooperative. Normal mood and affect  ASSESSMENT AND PLAN: *Black stools: Was evaluated for symptomatic microscopic anemia with melena as inpatient in December.  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  He is here today with ongoing complaints of black stools.  He is on oral iron supplementation.  His hemoglobin has been trending up and was 12.8 g just 2 days ago.  Hemoccult negative with dark green stool on exam today.  Think this is likely due to the iron.  Will recheck a CBC again in a week.  He is going to stop the iron for 5 days so just to see if it clears up for his own peace of mind.  He has been off of his PPI just recently.  Will resume omeprazole 40 mg daily.  Prescription sent to pharmacy. *Coronary artery disease with DES to the RCA in November 2023.  On Brilinta.  Currently wearing cardiac monitor for 2 weeks as well.  -Will need colonoscopy for colorectal cancer screening in the future once it is safe to hold his Brilinta.   CC:  Fenton Foy, NP

## 2022-03-08 NOTE — Patient Instructions (Addendum)
We have sent the following medications to your pharmacy for you to pick up at your convenience:   Start Omeprazole 40 mg take 1 capsule by mouth daily 30-60 minutes before meals.  Stop iron for 5 day's to see if dark stools clear Come back for labs in one week  If your blood pressure at your visit was 140/90 or greater, please contact your primary care physician to follow up on this.  _______________________________________________________  If you are age 51 or older, your body mass index should be between 23-30. Your Body mass index is 40.02 kg/m. If this is out of the aforementioned range listed, please consider follow up with your Primary Care Provider.  If you are age 51 or younger, your body mass index should be between 19-25. Your Body mass index is 40.02 kg/m. If this is out of the aformentioned range listed, please consider follow up with your Primary Care Provider.   ________________________________________________________  The [facility] providers would like to encourage you to use [facility] to communicate with providers for non-urgent requests or questions.  Due to long hold times on the telephone, sending your provider a message by [facility] may be a faster and more efficient way to get a response.  Please allow 48 business hours for a response.  Please remember that this is for non-urgent requests.   Thank you for entrusting me with your care and choosing [facility].  Alonza Bogus PA-C

## 2022-03-11 NOTE — Progress Notes (Signed)
Agree with the assessment and plan as outlined by Jessica Zehr, PA-C. ? ?Chanel Mcadams, DO, FACG ? ?

## 2022-03-13 ENCOUNTER — Encounter: Payer: Self-pay | Admitting: Gastroenterology

## 2022-03-13 ENCOUNTER — Other Ambulatory Visit: Payer: Self-pay

## 2022-03-13 MED ORDER — ACETAMINOPHEN-CODEINE 300-30 MG PO TABS
1.0000 | ORAL_TABLET | Freq: Four times a day (QID) | ORAL | 0 refills | Status: DC
  Filled 2022-03-13: qty 12, 3d supply, fill #0

## 2022-03-13 MED ORDER — IBUPROFEN 800 MG PO TABS
800.0000 mg | ORAL_TABLET | Freq: Four times a day (QID) | ORAL | 0 refills | Status: DC | PRN
  Filled 2022-03-13: qty 21, 6d supply, fill #0

## 2022-03-19 ENCOUNTER — Other Ambulatory Visit (INDEPENDENT_AMBULATORY_CARE_PROVIDER_SITE_OTHER): Payer: Self-pay

## 2022-03-19 DIAGNOSIS — K921 Melena: Secondary | ICD-10-CM

## 2022-03-19 DIAGNOSIS — K219 Gastro-esophageal reflux disease without esophagitis: Secondary | ICD-10-CM

## 2022-03-19 DIAGNOSIS — R1012 Left upper quadrant pain: Secondary | ICD-10-CM

## 2022-03-19 LAB — CBC
HCT: 37.7 % — ABNORMAL LOW (ref 39.0–52.0)
Hemoglobin: 13 g/dL (ref 13.0–17.0)
MCHC: 34.6 g/dL (ref 30.0–36.0)
MCV: 82.4 fl (ref 78.0–100.0)
Platelets: 231 10*3/uL (ref 150.0–400.0)
RBC: 4.57 Mil/uL (ref 4.22–5.81)
RDW: 24 % — ABNORMAL HIGH (ref 11.5–15.5)
WBC: 10.2 10*3/uL (ref 4.0–10.5)

## 2022-03-27 ENCOUNTER — Telehealth: Payer: Self-pay | Admitting: Licensed Clinical Social Worker

## 2022-03-27 NOTE — Telephone Encounter (Signed)
H&V Care Navigation CSW Progress Note  Clinical Social Worker contacted patient by phone to f/u again regarding assistance applications. Pt shares he has been working on them. Question about where to send in- discussed he could send in directly to [employer] and [employer] respectively. I shared that if confused/wanted further guidance to contact me and I can assist with submitting documents to appropriate venues. No additional questions/concerns at this time. Remain available should pt reach out with additional questions.    Patient is participating in a Managed Medicaid Plan:  No, self pay, Medicaid Family Planning.  SDOH Screenings   Food Insecurity: No Food Insecurity (01/24/2022)  Housing: Low Risk  (01/24/2022)  Transportation Needs: No Transportation Needs (01/24/2022)  Utilities: Not At Risk (01/24/2022)  Depression (PHQ2-9): Low Risk  (08/30/2021)  Financial Resource Strain: Medium Risk (12/12/2021)  Tobacco Use: Medium Risk (03/08/2022)   Westley Hummer, MSW, [place]  [phone]- work cell phone (preferred) [phone]- desk phone

## 2022-04-03 ENCOUNTER — Encounter: Payer: Self-pay | Admitting: Nurse Practitioner

## 2022-04-03 ENCOUNTER — Ambulatory Visit (INDEPENDENT_AMBULATORY_CARE_PROVIDER_SITE_OTHER): Payer: Self-pay | Admitting: Nurse Practitioner

## 2022-04-03 ENCOUNTER — Encounter: Payer: Self-pay | Admitting: Gastroenterology

## 2022-04-03 ENCOUNTER — Other Ambulatory Visit: Payer: Self-pay

## 2022-04-03 ENCOUNTER — Telehealth: Payer: Self-pay | Admitting: Nurse Practitioner

## 2022-04-03 VITALS — BP 141/80 | HR 77 | Temp 97.2°F | Ht 69.0 in | Wt 280.2 lb

## 2022-04-03 VITALS — BP 160/100 | HR 87 | Ht 69.0 in | Wt 279.0 lb

## 2022-04-03 DIAGNOSIS — K31819 Angiodysplasia of stomach and duodenum without bleeding: Secondary | ICD-10-CM

## 2022-04-03 DIAGNOSIS — R7989 Other specified abnormal findings of blood chemistry: Secondary | ICD-10-CM

## 2022-04-03 DIAGNOSIS — I10 Essential (primary) hypertension: Secondary | ICD-10-CM

## 2022-04-03 DIAGNOSIS — K922 Gastrointestinal hemorrhage, unspecified: Secondary | ICD-10-CM

## 2022-04-03 MED ORDER — OMEPRAZOLE 40 MG PO CPDR
40.0000 mg | DELAYED_RELEASE_CAPSULE | Freq: Every day | ORAL | 3 refills | Status: AC | PRN
  Filled 2022-04-03: qty 90, fill #0
  Filled 2022-08-13: qty 90, 90d supply, fill #0

## 2022-04-03 NOTE — Telephone Encounter (Signed)
Pt submitting docs and app for [employer] to [facility]

## 2022-04-03 NOTE — Patient Instructions (Addendum)
Stop taking omeprazole daily but you can take it as needed   Your are schedule for follow up visit 06/05/22 at 9 am   _______________________________________________________  If your blood pressure at your visit was 140/90 or greater, please contact your primary care physician to follow up on this.  _______________________________________________________  If you are age 51 or older, your body mass index should be between 23-30. Your Body mass index is 41.2 kg/m. If this is out of the aforementioned range listed, please consider follow up with your Primary Care Provider.  If you are age 66 or younger, your body mass index should be between 19-25. Your Body mass index is 41.2 kg/m. If this is out of the aformentioned range listed, please consider follow up with your Primary Care Provider.   ________________________________________________________  The [facility] [facility] [address] [facility] providers would like to encourage you to use [facility] to communicate with providers for non-urgent requests or questions.  Due to long hold times on the telephone, sending your provider a message by [facility] may be a faster and more efficient way to get a response.  Please allow 48 business hours for a response.  Please remember that this is for non-urgent requests.  _______________________________________________________   Thank you for entrusting me with your care and choosing [facility].  Tye Savoy NP

## 2022-04-03 NOTE — Telephone Encounter (Signed)
H&V Care Navigation CSW Progress Note  Clinical Social Worker  received call from pt regarding paperwork  to determine next steps for submitting it. LCSW offered for pt to bring to office so our team can mail it, or he can contact [place] and get appt to submit. He believes he has all needed paperwork at this time. I provided Jt's number and I have sent him a message as well that pt in need of assistance submitting paperwork. I remain available.  Patient is participating in a Managed Medicaid Plan:  No, self pay only, working on [place]: No Food Insecurity (01/24/2022)  Housing: Low Risk  (01/24/2022)  Transportation Needs: No Transportation Needs (01/24/2022)  Utilities: Not At Risk (01/24/2022)  Depression (PHQ2-9): Low Risk  (08/30/2021)  Financial Resource Strain: Medium Risk (12/12/2021)  Tobacco Use: Medium Risk (04/03/2022)   Westley Hummer, MSW, [place]  [phone]- work cell phone (preferred) [phone]- desk phone

## 2022-04-03 NOTE — Progress Notes (Unsigned)
Assessment    # UGIB on Brillinta / ASA in December. 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 Unclear why he is here for a visit today, he had a hospital follow up here earlier this month. No further bleeding . Anemia already resolved. No longer taking daily baby asa  # HTN. BP is elevated today at 160 /100   Plan   -Can stop daily Omeprazole. We had him on BID pantoprazole x 4 weeks after EGD but now on Omeprazole. Can take as needed for heartburn.  -Follow up in May to discuss a screening colonoscopy. He will have been on Brillinta 6 months by then. -Advised to contact PCP today about his BP. He is taking Coreg.     HPI    Chief complaint: none. This is possibly a duplicate appt   Gene Reed is a 52 y.o. male known to Dr. Bryan Reed. He has a history of CAD s/p DES in November 2023 and is on [place]. See past medical history below.    Patient was seen earlier this month by Gene Cowing, PA after being hospitaized for a GIB. EGD revealing He was complaining on ongoing black stools and was off PPI.  Please refer to her note on 03/08/22. EGD 12/20 showed angioectasia in duodenum treated with APC. Gene Reed has been monitoring his hgb which normalized to 13 on 2/13. No longer on iron . His BMs are back to normal.    Labs:     Latest Ref Rng & Units 03/19/2022   12:14 PM 03/06/2022    9:42 AM 03/01/2022    3:13 PM  CBC  WBC 4.0 - 10.5 K/uL 10.2  8.8  9.4   Hemoglobin 13.0 - 17.0 g/dL 13.0  12.8  12.3   Hematocrit 39.0 - 52.0 % 37.7  39.5  38.1   Platelets 150.0 - 400.0 K/uL 231.0  230  209        Latest Ref Rng & Units 03/01/2022    3:13 PM 01/27/2022    8:00 PM 01/23/2022    3:28 AM  Hepatic Function  Total Protein 6.0 - 8.5 g/dL 8.0  7.7  7.3   Albumin 4.1 - 5.1 g/dL 4.7  3.9  3.7   AST 0 - 40 IU/L 47  40  48   ALT 0 - 44 IU/L 35  46  25   Alk Phosphatase 44 - 121 IU/L 150  109  85   Total Bilirubin 0.0 - 1.2 mg/dL 0.7  0.8  1.8      Past Medical History:   Diagnosis Date   Adrenal nodule (HCC)    Arthritis    Bilateral chronic knee pain    Bone spur    CAD (coronary artery disease)    Gout    Hyperlipidemia    Hypertension     Past Surgical History:  Procedure Laterality Date   CORONARY LITHOTRIPSY N/A 12/06/2021   Procedure: CORONARY LITHOTRIPSY;  Surgeon: Troy Sine, MD;  Location: [place] CV LAB;  Service: Cardiovascular;  Laterality: N/A;   CORONARY STENT INTERVENTION Right 12/06/2021   Procedure: CORONARY STENT INTERVENTION;  Surgeon: Troy Sine, MD;  Location: [place] [facility];  Service: Cardiovascular;  Laterality: Right;   ESOPHAGOGASTRODUODENOSCOPY Left 01/23/2022   Procedure: ESOPHAGOGASTRODUODENOSCOPY (EGD);  Surgeon: Lavena Bullion, DO;  [facility] ENDOSCOPY;  Service: Gastroenterology;  Laterality: Left;   HOT HEMOSTASIS N/A 01/23/2022   Procedure: HOT HEMOSTASIS (ARGON PLASMA COAGULATION/BICAP);  Surgeon: Gene Reed,  Dominic Pea, DO;  Location: [place] ENDOSCOPY;  Service: Gastroenterology;  Laterality: N/A;   KNEE SURGERY     LEFT HEART CATH AND CORONARY ANGIOGRAPHY N/A 12/06/2021   Procedure: LEFT HEART CATH AND CORONARY ANGIOGRAPHY;  Surgeon: Troy Sine, MD;  Location: [place] [facility] LAB;  Service: Cardiovascular;  Laterality: N/A;    Current Medications, Allergies, Family History and Social History were reviewed in [employer] record.     Current Outpatient Medications  Medication Sig Dispense Refill   acetaminophen-codeine (TYLENOL #3) 300-30 MG tablet Take 1 tablet by mouth every 6 (six) hours. 12 tablet 0   allopurinol (ZYLOPRIM) 100 MG tablet Take 1 tablet (100 mg total) by mouth daily. 90 tablet 3   carvedilol (COREG) 25 MG tablet Take 1 tablet (25 mg total) by mouth 2 (two) times daily. 180 tablet 3   ferrous sulfate (FEROSUL) 325 (65 FE) MG tablet Take 1 tablet (325 mg total) by mouth every other day. 30 tablet 0   ibuprofen (ADVIL) 800 MG tablet Take 1 tablet (800 mg  total) by mouth every 6 (six) hours as needed. 21 tablet 0   nitroGLYCERIN (NITROSTAT) 0.4 MG SL tablet Place 1 tablet (0.4 mg total) under the tongue every 5 (five) minutes as needed for chest pain. (Patient not taking: Reported on 03/01/2022) 25 tablet 3   omeprazole (PRILOSEC) 40 MG capsule Take 1 capsule (40 mg total) by mouth daily. 90 capsule 3   rosuvastatin (CRESTOR) 40 MG tablet Take 1 tablet (40 mg total) by mouth daily. 30 tablet 5   ticagrelor (BRILINTA) 90 MG TABS tablet Take 1 tablet (90 mg total) by mouth 2 (two) times daily. 60 tablet 11   No current facility-administered medications for this visit.    Review of Systems: No chest pain. Positive for intermittent shortness of breath. No urinary complaints.    Physical Exam  Wt Readings from Last 3 Encounters:  03/08/22 271 lb (122.9 kg)  03/06/22 275 lb (124.7 kg)  03/01/22 269 lb (122 kg)    BP (!) 160/100   Pulse 87   Ht '5\' 9"'$  (1.753 m)   Wt 279 lb (126.6 kg)   SpO2 97%   BMI 41.20 kg/m  Constitutional:  Pleasant, obese generally well appearing male in no acute distress. Psychiatric: Normal mood and affect. Behavior is normal. Neurological: Alert and oriented to person place and time.  Skin: Skin is warm and dry. No rashes noted.  Tye Savoy, NP  04/03/2022, 8:26 AM  Cc:  Fenton Foy, NP

## 2022-04-03 NOTE — Progress Notes (Unsigned)
$'@Patient'u$  ID: Gene Reed, male    DOB: 1970/05/05, 52 y.o.   MRN: [id number]  Chief Complaint  Patient presents with   Hypertension    Referring provider: Fenton Foy, NP   HPI  Patient presents today for a follow-up on hypertension.  He states that he does have decreased energy and shortness of breath here lately.  He does have an upcoming appointment with cardiology. Denies f/c/s, n/v/d, hemoptysis, PND, leg swelling Denies chest pain or edema        Allergies  Allergen Reactions   Poison Ivy Extract Itching    Poison oak    Immunization History  Administered Date(s) Administered   PFIZER(Purple Top)SARS-COV-2 Vaccination 06/22/2019, 07/25/2019   Tdap 09/21/2015    Past Medical History:  Diagnosis Date   Adrenal nodule (HCC)    Arthritis    Bilateral chronic knee pain    Bone spur    CAD (coronary artery disease)    Gout    Hyperlipidemia    Hypertension     Tobacco History: Social History   Tobacco Use  Smoking Status Former  Smokeless Tobacco Never   Counseling given: Not Answered   Outpatient Encounter Medications as of 04/03/2022  Medication Sig   acetaminophen-codeine (TYLENOL #3) 300-30 MG tablet Take 1 tablet by mouth every 6 (six) hours.   allopurinol (ZYLOPRIM) 100 MG tablet Take 1 tablet (100 mg total) by mouth daily.   carvedilol (COREG) 25 MG tablet Take 1 tablet (25 mg total) by mouth 2 (two) times daily.   omeprazole (PRILOSEC) 40 MG capsule Take 1 capsule (40 mg total) by mouth continuous as needed.   rosuvastatin (CRESTOR) 40 MG tablet Take 1 tablet (40 mg total) by mouth daily.   ticagrelor (BRILINTA) 90 MG TABS tablet Take 1 tablet (90 mg total) by mouth 2 (two) times daily.   ferrous sulfate (FEROSUL) 325 (65 FE) MG tablet Take 1 tablet (325 mg total) by mouth every other day. (Patient not taking: Reported on 04/03/2022)   ibuprofen (ADVIL) 800 MG tablet Take 1 tablet (800 mg total) by mouth every 6 (six) hours as needed.  (Patient not taking: Reported on 04/03/2022)   nitroGLYCERIN (NITROSTAT) 0.4 MG SL tablet Place 1 tablet (0.4 mg total) under the tongue every 5 (five) minutes as needed for chest pain. (Patient not taking: Reported on 03/01/2022)   No facility-administered encounter medications on file as of 04/03/2022.     Review of Systems  Review of Systems  Constitutional: Negative.   HENT: Negative.    Cardiovascular: Negative.   Gastrointestinal: Negative.   Allergic/Immunologic: Negative.   Neurological: Negative.   Psychiatric/Behavioral: Negative.         Physical Exam  BP (!) 141/80   Pulse 77   Temp (!) 97.2 F (36.2 C)   Ht '5\' 9"'$  (1.753 m)   Wt 280 lb 3.2 oz (127.1 kg)   SpO2 97%   BMI 41.38 kg/m   Wt Readings from Last 5 Encounters:  04/03/22 280 lb 3.2 oz (127.1 kg)  04/03/22 279 lb (126.6 kg)  03/08/22 271 lb (122.9 kg)  03/06/22 275 lb (124.7 kg)  03/01/22 269 lb (122 kg)     Physical Exam Vitals and nursing note reviewed.  Constitutional:      General: He is not in acute distress.    Appearance: He is well-developed.  Cardiovascular:     Rate and Rhythm: Normal rate and regular rhythm.  Pulmonary:     Effort:  Pulmonary effort is normal.     Breath sounds: Normal breath sounds.  Skin:    General: Skin is warm and dry.  Neurological:     Mental Status: He is alert and oriented to person, place, and time.      Lab Results:  CBC    Component Value Date/Time   WBC 10.1 04/03/2022 1401   WBC 10.2 03/19/2022 1214   RBC 4.67 04/03/2022 1401   RBC 4.57 03/19/2022 1214   HGB 13.1 04/03/2022 1401   HCT 39.9 04/03/2022 1401   PLT 215 04/03/2022 1401   MCV 85 04/03/2022 1401   MCH 28.1 04/03/2022 1401   MCH 25.6 (L) 01/27/2022 2000   MCHC 32.8 04/03/2022 1401   MCHC 34.6 03/19/2022 1214   RDW 19.1 (H) 04/03/2022 1401   LYMPHSABS 2.8 02/05/2022 1016   LYMPHSABS 3.0 08/13/2019 0936   MONOABS 0.7 02/05/2022 1016   EOSABS 0.2 02/05/2022 1016   EOSABS 0.3  08/13/2019 0936   BASOSABS 0.1 02/05/2022 1016   BASOSABS 0.1 08/13/2019 0936    BMET    Component Value Date/Time   NA 138 04/03/2022 1401   K 4.2 04/03/2022 1401   CL 102 04/03/2022 1401   CO2 20 04/03/2022 1401   GLUCOSE 114 (H) 04/03/2022 1401   GLUCOSE 104 (H) 01/27/2022 2000   BUN 9 04/03/2022 1401   CREATININE 0.72 (L) 04/03/2022 1401   CREATININE 0.66 11/20/2016 1051   CALCIUM 9.2 04/03/2022 1401   GFRNONAA >60 01/27/2022 2000   GFRNONAA 117 11/20/2016 1051   GFRAA 133 11/12/2019 0925   GFRAA 135 11/20/2016 1051    BNP No results found for: "BNP"  ProBNP No results found for: "PROBNP"  Imaging: DG Tibia/Fibula Left  Result Date: 03/07/2022 CLINICAL DATA:  Recent laceration with leg pain, initial encounter EXAM: LEFT TIBIA AND FIBULA - 2 VIEW COMPARISON:  11/06/2013 FINDINGS: Postsurgical changes are noted in the proximal tibia consistent with prior reconstructive surgery. No acute fracture or dislocation is seen. No soft tissue is noted. IMPRESSION: No acute abnormality noted. Electronically Signed   By: Inez Catalina M.D.   On: 03/07/2022 23:32     Assessment & Plan:   Hypertension - AMB Referral to Pharmacy Medication Management - CBC - Comprehensive metabolic panel - Brain natriuretic peptide  Follow up:  Follow up in 23month     TFenton Foy NP 04/04/2022

## 2022-04-04 ENCOUNTER — Encounter: Payer: Self-pay | Admitting: Nurse Practitioner

## 2022-04-04 ENCOUNTER — Encounter: Payer: Self-pay | Admitting: Gastroenterology

## 2022-04-04 LAB — COMPREHENSIVE METABOLIC PANEL
ALT: 40 IU/L (ref 0–44)
AST: 56 IU/L — ABNORMAL HIGH (ref 0–40)
Albumin/Globulin Ratio: 1.3 (ref 1.2–2.2)
Albumin: 4.7 g/dL (ref 4.1–5.1)
Alkaline Phosphatase: 174 IU/L — ABNORMAL HIGH (ref 44–121)
BUN/Creatinine Ratio: 13 (ref 9–20)
BUN: 9 mg/dL (ref 6–24)
Bilirubin Total: 0.8 mg/dL (ref 0.0–1.2)
CO2: 20 mmol/L (ref 20–29)
Calcium: 9.2 mg/dL (ref 8.7–10.2)
Chloride: 102 mmol/L (ref 96–106)
Creatinine, Ser: 0.72 mg/dL — ABNORMAL LOW (ref 0.76–1.27)
Globulin, Total: 3.5 g/dL (ref 1.5–4.5)
Glucose: 114 mg/dL — ABNORMAL HIGH (ref 70–99)
Potassium: 4.2 mmol/L (ref 3.5–5.2)
Sodium: 138 mmol/L (ref 134–144)
Total Protein: 8.2 g/dL (ref 6.0–8.5)
eGFR: 111 mL/min/{1.73_m2} (ref >59)

## 2022-04-04 LAB — CBC
Hematocrit: 39.9 % (ref 37.5–51.0)
Hemoglobin: 13.1 g/dL (ref 13.0–17.7)
MCH: 28.1 pg (ref 26.6–33.0)
MCHC: 32.8 g/dL (ref 31.5–35.7)
MCV: 85 fL (ref 79–97)
Platelets: 215 10*3/uL (ref 150–450)
RBC: 4.67 x10E6/uL (ref 4.14–5.80)
RDW: 19.1 % — ABNORMAL HIGH (ref 11.6–15.4)
WBC: 10.1 10*3/uL (ref 3.4–10.8)

## 2022-04-04 NOTE — Patient Instructions (Addendum)
1. Primary hypertension  - AMB Referral to Pharmacy Medication Management - CBC - Comprehensive metabolic panel - Brain natriuretic peptide  Follow up:  Follow up in 25month

## 2022-04-04 NOTE — Assessment & Plan Note (Signed)
-   AMB Referral to Pharmacy Medication Management - CBC - Comprehensive metabolic panel - Brain natriuretic peptide  Follow up:  Follow up in 18month

## 2022-04-05 ENCOUNTER — Telehealth: Payer: Self-pay

## 2022-04-05 NOTE — Progress Notes (Unsigned)
   Care Guide Note  04/05/2022 Name: Gene Reed MRN: [id number] DOB: [birth year]  Referred by: Fenton Foy, NP Reason for referral : Care Coordination (Outreach to schedule referral with Pharm d In April )   Gene Reed is a 52 y.o. year old male who is a primary care patient of Fenton Foy, NP. Gene Reed was referred to the pharmacist for assistance related to HTN.    An unsuccessful telephone outreach was attempted today to contact the patient who was referred to the pharmacy team for assistance with medication assistance. Additional attempts will be made to contact the patient.   Noreene Larsson, [place], [place] [address] Direct Dial: [phone] [email or web address]

## 2022-04-08 ENCOUNTER — Telehealth: Payer: Self-pay

## 2022-04-08 ENCOUNTER — Other Ambulatory Visit: Payer: Self-pay

## 2022-04-08 DIAGNOSIS — R7989 Other specified abnormal findings of blood chemistry: Secondary | ICD-10-CM

## 2022-04-08 NOTE — Telephone Encounter (Signed)
Contacted the patient and advised. Patient agrees to this plan of care. Ordered for the imaging entered and Radiology Scheduling notified.

## 2022-04-08 NOTE — Telephone Encounter (Signed)
-----   Message from Willia Craze, NP sent at 04/05/2022  1:13 PM EST ----- Hi Beth, please call patient next week.  I saw that he went to his PCPs office a couple days ago and his LFTs are a little bit higher than the previous.  Please let him know that I contacted his PCP and for now I am going to take over the workup of his elevated LFTs.  Would you please set him up for an RUQ ultrasound to evaluate elevated LFTs?  Thanks

## 2022-04-09 ENCOUNTER — Encounter: Payer: Self-pay | Admitting: Gastroenterology

## 2022-04-09 ENCOUNTER — Other Ambulatory Visit: Payer: Self-pay

## 2022-04-09 ENCOUNTER — Ambulatory Visit: Payer: Self-pay | Attending: Nurse Practitioner

## 2022-04-09 NOTE — Progress Notes (Signed)
   Care Guide Note  04/09/2022 Name: Gene Reed MRN: [id number] DOB: May 25, 1970  Referred by: Fenton Foy, NP Reason for referral : Care Coordination (Outreach to schedule referral with Pharm d In April )   Gene Reed is a 52 y.o. year old male who is a primary care patient of Fenton Foy, NP. Gene Reed was referred to the pharmacist for assistance related to HTN.    Successful contact was made with the patient to discuss pharmacy services including being ready for the pharmacist to call at least 5 minutes before the scheduled appointment time, to have medication bottles and any blood sugar or blood pressure readings ready for review. The patient agreed to meet with the pharmacist via with the pharmacist via telephone visit on (date/time).  05/08/2022  Noreene Larsson, [place], [place] [address] Direct Dial: [phone] [email or web address]

## 2022-04-12 NOTE — Progress Notes (Signed)
Agree with the assessment and plan as outlined by Paula Guenther, NP. ° °Yovanny Coats, DO, FACG ° °

## 2022-04-15 ENCOUNTER — Ambulatory Visit (HOSPITAL_BASED_OUTPATIENT_CLINIC_OR_DEPARTMENT_OTHER)
Admission: RE | Admit: 2022-04-15 | Discharge: 2022-04-15 | Disposition: A | Discharge status: Home / Self Care | Payer: Medicaid Other | Source: Ambulatory Visit | Attending: Nurse Practitioner | Admitting: Nurse Practitioner

## 2022-04-15 DIAGNOSIS — R7989 Other specified abnormal findings of blood chemistry: Secondary | ICD-10-CM | POA: Diagnosis present

## 2022-04-15 NOTE — Progress Notes (Deleted)
Cardiology Clinic Note   Patient Name: Gene Reed Date of Encounter: 04/15/2022  Primary Care Provider:  Fenton Foy, NP Primary Cardiologist:  Shelva Majestic, MD  Patient Profile    Gene Reed 52 year old male since the clinic today for follow-up evaluation of his hypertension and coronary artery disease.  Past Medical History    Past Medical History:  Diagnosis Date   Adrenal nodule ([place])    Arthritis    Bilateral chronic knee pain    Bone spur    CAD (coronary artery disease)    Gout    Hyperlipidemia    Hypertension    Past Surgical History:  Procedure Laterality Date   CORONARY LITHOTRIPSY N/A 12/06/2021   Procedure: CORONARY LITHOTRIPSY;  Surgeon: Troy Sine, MD;  Location: [facility];  Service: Cardiovascular;  Laterality: N/A;   CORONARY STENT INTERVENTION Right 12/06/2021   Procedure: CORONARY STENT INTERVENTION;  Surgeon: Troy Sine, MD;  Location: [facility];  Service: Cardiovascular;  Laterality: Right;   ESOPHAGOGASTRODUODENOSCOPY Left 01/23/2022   Procedure: ESOPHAGOGASTRODUODENOSCOPY (EGD);  Surgeon: Lavena Bullion, DO;  Location[facility] END[facility]COPY;  Service: Gastroenterology;  Laterality: Left;   HOT HEMOSTASIS N/A 01/23/2022   Procedure: HOT HEMOSTASIS (ARGON PLASMA COAGULATION/BICAP);  Surgeon: Lavena Bullion, DO;  Location: [facility] ENDOSCOPY;  Service: Gastroenterology;  Laterality: N/A;   KNEE SURGERY     LEFT HEART CATH AND CORONARY ANGIOGRAPHY N/A 12/06/2021   Procedure: LEFT HEART CATH AND CORONARY ANGIOGRAPHY;  Surgeon: Troy Sine, MD;  Location: [facility];  Service: Cardiovascular;  Laterality: N/A;    Allergies  Allergies  Allergen Reactions   Poison Ivy Extract Itching    Poison oak    History of Present Illness    Gene Reed is a PMH of HTN, CAD, AVM, melena, HLD, anemia, orthostatic syncope, gout, GERD, OSA, and chronic knee pain.  He was seen by Dr. Claiborne Billings 8/23 for evaluation of  chest discomfort.  It was felt to be atypical and MSK in nature.  A coronary calcium score and echocardiogram were ordered for further evaluation and showed an EF of 65 to 70%, mild LVH, and normal diastolic parameters with mild mitral valve regurgitation.  His coronary calcium score was noted to be 1803 which placed him in the 99th percentile for controls.  Coronary CT was ordered and showed possible severe proximal RCA stenosis and moderate LAD as well as left circumflex stenosis.  His FFR was in occlusive.  His CTA also showed a 3 cm left adrenal nodule.  Further imaging was recommended.  He underwent cardiac catheterization 12/06/2021 which showed 15% stenosis in his mid-distal left main, 90% stenosis of his proximal RCA, 20% stenosis in proximal-mid RCA, 40% stenosis proximal-mid LAD followed by 75 and 40% stenosis in mid LAD as well as 20% OM 2.  He underwent successful PCI to severely calcified proximal RCA with PTCA and scoring balloon with DES.  His LAD disease was treated medically.  Plans were made for staged PCI to his LAD if he has recurrent anginal symptoms.  Post cath he was placed on aspirin and Brilinta as well as Imdur.  He was instructed to follow-up with his PCP regarding his adrenal nodule.  He was seen for follow-up evaluation 01/21/2022.  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  Almyra Deforest PA-C ordered CBC, BMP, TSH and 2-week cardiac event monitor.  He was noted to be  orthostatic in the office with blood pressure dropping from 99 down to 68 systolic.  He was instructed to rehydrate.  His CBC showed severe anemia with a hemoglobin of 6.5.  He was sent to the hospital his aspirin and Brilinta were temporarily held.  He underwent endoscopy which showed small angiectasia which was treated with APC.  He required 2 units of PRBCs.  He received IV iron therapy.  His dual antiplatelet therapy was resumed after endoscopy.  He was seen in follow-up by Almyra Deforest PA-C on  03/06/2022.  During that time he reported that he had initially done well after being discharged from the hospital however he noted increased fatigue over the prior few weeks.  His blood pressure was elevated and on recheck his blood pressure was noted to be 160/100.  He reported that his blood pressure had been elevated at home.  His carvedilol was increased to 25 mg twice daily.  He was noticing recurrent dark discoloration in his stool.  He was worried about recurrent GI bleed.  He discontinued aspirin and continue to take his Brilinta due to concerns for GI bleed.  He had been seen by internal medicine service 5 days prior.  His hemoglobin was noted to be 12.  His CBC was repeated.  He was also noted to have a shin injury and a left tib-fib x-ray was ordered to evaluate for fracture.  Follow-up was planned for 6 weeks.  He did report that he had missed his appointment with GI a few days prior and his visit had been rescheduled for February.  CBC 04/03/2022 showed a hemoglobin of 13.1.  Tib-fib x-ray showed no acute abnormality.  He presents to the clinic today for follow-up evaluation and states***  *** denies chest pain, shortness of breath, lower extremity edema, fatigue, palpitations, melena, hematuria, hemoptysis, diaphoresis, weakness, presyncope, syncope, orthopnea, and PND.  Essential hypertension-BP today*** Continue carvedilol, Heart healthy low-sodium diet-salty 6 given Increase physical activity as tolerated Maintain blood pressure log  Coronary artery disease-underwent cardiac catheterization 12/06/2021.  He was noted to have proximal RCA 90%, proximal-mid RCA 20%, mid left main-distal left main 15%, proximal-mid LAD 40% mid LAD 75%, mid LAD to 40%, proximal circumflex 20%, second marginal lesion 20%.  He received PTCA, scoring balloon, and D ES to his proximal RCA.  Plan for staged PCI if recurrent angina to his LAD.  Medical management with dual antiplatelet therapy was recommended for 12  months.  He developed GI bleed and was placed on monotherapy Brilinta. Continue carvedilol, rosuvastatin, Brilinta Heart healthy low-sodium diet-salty 6 given Increase physical activity as tolerated  Hyperlipidemia-LDL***. Her healthy low-sodium high-fiber diet Continue rosuvastatin Repeat fasting lipids and LFTs   OSA-compliant with CPAP.  Waking up well rested. Continue CPAP use  History of GI bleed-previously noted to be orthostatic and with reduced hemoglobin.  He was admitted to the hospital and underwent endoscopy which showed small angiectasia which was treated with APC.  He required 2 units of PRBCs and iron therapy. Follows with GI/PCP  Disposition: Follow-up with Almyra Deforest PA-C or me in 4 months.  Home Medications    Prior to Admission medications   Medication Sig Start Date End Date Taking? Authorizing Provider  acetaminophen-codeine (TYLENOL #3) 300-30 MG tablet Take 1 tablet by mouth every 6 (six) hours. 03/13/22     allopurinol (ZYLOPRIM) 100 MG tablet Take 1 tablet (100 mg total) by mouth daily. 03/01/22   Fenton Foy, NP  carvedilol (COREG) 25 MG tablet  Take 1 tablet (25 mg total) by mouth 2 (two) times daily. 03/06/22   Almyra Deforest, PA  ferrous sulfate (FEROSUL) 325 (65 FE) MG tablet Take 1 tablet (325 mg total) by mouth every other day. Patient not taking: Reported on 04/03/2022 02/26/22   Fenton Foy, NP  ibuprofen (ADVIL) 800 MG tablet Take 1 tablet (800 mg total) by mouth every 6 (six) hours as needed. Patient not taking: Reported on 04/03/2022 03/13/22     nitroGLYCERIN (NITROSTAT) 0.4 MG SL tablet Place 1 tablet (0.4 mg total) under the tongue every 5 (five) minutes as needed for chest pain. Patient not taking: Reported on 03/01/2022 11/29/21 02/27/22  Darreld Mclean, PA-C  omeprazole (PRILOSEC) 40 MG capsule Take 1 capsule (40 mg total) by mouth continuous as needed. 04/03/22   Willia Craze, NP  rosuvastatin (CRESTOR) 40 MG tablet Take 1 tablet (40 mg  total) by mouth daily. 12/07/21   Dunn, Nedra Hai, PA-C  ticagrelor (BRILINTA) 90 MG TABS tablet Take 1 tablet (90 mg total) by mouth 2 (two) times daily. 12/07/21   Charlie Pitter, PA-C    Family History    No family history on file. He indicated that his mother is deceased. He indicated that the status of his father is unknown.  Social History    Social History   Socioeconomic History   Marital status: Widowed    Spouse name: Not on file   Number of children: Not on file   Years of education: Not on file   Highest education level: Not on file  Occupational History   Not on file  Tobacco Use   Smoking status: Former   Smokeless tobacco: Never  Vaping Use   Vaping Use: Never used  Substance and Sexual Activity   Alcohol use: Yes    Comment:  6 beers per a day    Drug use: Yes    Types: Marijuana    Comment: 1 joint a day    Sexual activity: Not Currently  Other Topics Concern   Not on file  Social History Narrative   Not on file   Social Determinants of Health   Financial Resource Strain: Medium Risk (12/12/2021)   Overall Financial Resource Strain (CARDIA)    Difficulty of Paying Living Expenses: Somewhat hard  Food Insecurity: No Food Insecurity (01/24/2022)   Hunger Vital Sign    Worried About Running Out of Food in the Last Year: Never true    Ran Out of Food in the Last Year: Never true  Transportation Needs: No Transportation Needs (01/24/2022)   PRAPARE - Hydrologist (Medical): No    Lack of Transportation (Non-Medical): No  Physical Activity: Not on file  Stress: Not on file  Social Connections: Not on file  Intimate Partner Violence: Not At Risk (01/24/2022)   Humiliation, Afraid, Rape, and Kick questionnaire    Fear of Current or Ex-Partner: No    Emotionally Abused: No    Physically Abused: No    Sexually Abused: No     Review of Systems    General:  No chills, fever, night sweats or weight changes.  Cardiovascular:   No chest pain, dyspnea on exertion, edema, orthopnea, palpitations, paroxysmal nocturnal dyspnea. Dermatological: No rash, lesions/masses Respiratory: No cough, dyspnea Urologic: No hematuria, dysuria Abdominal:   No nausea, vomiting, diarrhea, bright red blood per rectum, melena, or hematemesis Neurologic:  No visual changes, wkns, changes in mental status. All other systems  reviewed and are otherwise negative except as noted above.  Physical Exam    VS:  There were no vitals taken for this visit. , BMI There is no height or weight on file to calculate BMI. GEN: Well nourished, well developed, in no acute distress. HEENT: normal. Neck: Supple, no JVD, carotid bruits, or masses. Cardiac: RRR, no murmurs, rubs, or gallops. No clubbing, cyanosis, edema.  Radials/DP/PT 2+ and equal bilaterally.  Respiratory:  Respirations regular and unlabored, clear to auscultation bilaterally. GI: Soft, nontender, nondistended, BS + x 4. MS: no deformity or atrophy. Skin: warm and dry, no rash. Neuro:  Strength and sensation are intact. Psych: Normal affect.  Accessory Clinical Findings    Recent Labs: 01/21/2022: TSH 2.320 04/03/2022: ALT 40; BUN 9; Creatinine, Ser 0.72; Hemoglobin 13.1; Platelets 215; Potassium 4.2; Sodium 138   Recent Lipid Panel    Component Value Date/Time   CHOL 181 04/06/2021 1058   TRIG 245 (H) 04/06/2021 1058   HDL 42 04/06/2021 1058   CHOLHDL 4.3 04/06/2021 1058   LDLCALC 97 04/06/2021 1058    No BP recorded.  {Refresh Note OR Click here to enter BP  :1}***    ECG personally reviewed by me today- *** - No acute changes  Cardiac catheterization 12/06/2021    Prox RCA lesion is 90% stenosed.   Prox RCA to Mid RCA lesion is 20% stenosed.   Mid LM to Dist LM lesion is 15% stenosed.   Prox LAD to Mid LAD lesion is 40% stenosed.   Mid LAD-1 lesion is 75% stenosed.   Mid LAD-2 lesion is 40% stenosed.   Prox Cx lesion is 20% stenosed.   2nd Mrg lesion is 20%  stenosed.   A drug-eluting stent was successfully placed.   Post intervention, there is a 0% residual stenosis.   Multivessel CAD with severely calcified proximal RCA stenosis of 90% followed by mid 20% stenosis in a dominant RCA which supplies the LV apex.   Diffusely calcified proximal LAD stenoses of 40% 75% and 40%.   Mild nonobstructive disease in the distal left main and left circumflex vessel.   LVEDP 21 mmHg.   Difficult but successful PCI to the severely calcified proximal RCA with PTCA, scoring balloon, and intravascular lithotripsy and ultimate insertion of a 3.5 x 20 mm Synergy DES stent postdilated to 3.75 mm with the stenosis being reduced to 0% and successful lithotripsy with resolution of previous significant calcification.   RECOMMENDATION: DAPT for minimum of 12 months probably longer.  Increase medical therapy for concomitant LAD disease.  If patient has recurrent symptomatology will need staged PCI to LAD.  Diagnostic Dominance: Right  Intervention      Assessment & Plan   1.  ***   Jossie Ng. Gaynell Eggleton NP-C     04/15/2022, 6:53 AM [place] [address] Office [phone] Fax [phone]    I spent***minutes examining this patient, reviewing medications, and using patient centered shared decision making involving her cardiac care.  Prior to her visit I spent greater than 20 minutes reviewing her past medical history,  medications, and prior cardiac tests.

## 2022-04-17 ENCOUNTER — Ambulatory Visit: Payer: Medicaid Other | Attending: General Practice | Admitting: General Practice

## 2022-04-19 ENCOUNTER — Encounter: Payer: Self-pay | Admitting: Gastroenterology

## 2022-04-22 ENCOUNTER — Encounter: Payer: Self-pay | Admitting: Nurse Practitioner

## 2022-04-22 NOTE — Assessment & Plan Note (Signed)
-   CBC - Comprehensive metabolic panel   2. Acute gout of foot, unspecified cause, unspecified laterality  - allopurinol (ZYLOPRIM) 100 MG tablet; TAKE 1 TABLET (100 MG TOTAL) BY MOUTH DAILY.  Dispense: 90 tablet; Refill: 3   Follow up:  Follow up in 3 months

## 2022-04-24 ENCOUNTER — Ambulatory Visit: Payer: Self-pay | Admitting: Cardiovascular Disease

## 2022-05-01 NOTE — Addendum Note (Signed)
Addended by: Virgina Evener A on: 05/01/2022 05:39 PM   Modules accepted: Orders

## 2022-05-02 ENCOUNTER — Other Ambulatory Visit: Payer: Self-pay | Admitting: Nurse Practitioner

## 2022-05-02 DIAGNOSIS — R748 Abnormal levels of other serum enzymes: Secondary | ICD-10-CM

## 2022-05-08 ENCOUNTER — Other Ambulatory Visit: Payer: Medicaid Other | Admitting: Pharmacist

## 2022-05-08 ENCOUNTER — Telehealth: Payer: Self-pay | Admitting: Pharmacist

## 2022-05-08 NOTE — Progress Notes (Signed)
Attempted to contact patient for scheduled appointment for medication management. Left HIPAA compliant message for patient to return my call at their convenience.    Catie T. Casady Voshell, PharmD, BCACP, CPP [facility] [phone]  

## 2022-05-09 ENCOUNTER — Other Ambulatory Visit: Payer: Self-pay

## 2022-05-09 ENCOUNTER — Encounter: Payer: Self-pay | Admitting: Gastroenterology

## 2022-05-10 ENCOUNTER — Other Ambulatory Visit: Payer: Self-pay | Admitting: Pharmacist

## 2022-05-10 NOTE — Progress Notes (Signed)
Patient seen by Coralyn Helling, PharmD Candidate on 05/09/2022 while they were picking up prescriptions at [facility].   Blood pressure today was : 144/88, HR 74; patient refused recheck. Patient did sit for >5 minutes before taking pressure, but says he just "went up a bunch of stairs" and may still have elevated BP due to that.   Patient has an automated home blood pressure machine. They report home readings in the 140's/90's.   Medication review was performed. Patient takes carvedilol for BP. They are taking medications as prescribed without missing doses. Patient has taken meds today.   The following barriers to adherence were noted:  - They do not have cost concerns.  - They do not have transportation concerns.  - They do not need assistance obtaining refills.  - They do not occasionally forget to take some of their prescribed medications.  - They do not feel like one/some of their medications make them feel poorly.  - They do not have questions or concerns about their medications.  - They do not have follow up scheduled with their primary care provider/cardiologist.   The following interventions were completed:  - Medications were reviewed  - Patient was educated on goal blood pressures and long term health implications of elevated blood pressure  - Patient was educated on proper technique to check home blood pressure and reminded to bring home machine and readings to next provider appointment  - Patient was counseled on lifestyle modifications to improve blood pressure, including decreasing salt intake (patient reports he is doing better with this) and exercising - he states he does not want to add additional meds and will work on lifestyle changes  - Patient instructed to make PCP appt and consider additional medication for BP control   Coralyn Helling, PharmD Candidate   Alvino Blood, PharmD, BCACP

## 2022-05-11 ENCOUNTER — Encounter: Payer: Self-pay | Admitting: Gastroenterology

## 2022-05-14 ENCOUNTER — Telehealth: Payer: Self-pay | Admitting: Pharmacist

## 2022-05-14 NOTE — Progress Notes (Signed)
Patient attempted to be outreached by Alex Perez, PharmD Candidate on 05/14/2022 to discuss hypertension. Left voicemail for patient to return our call at their convenience at [phone].   Alex Perez, PharmD Candidate   Catie T. Yosgar Demirjian, PharmD, BCACP, CPP [facility] [phone]  

## 2022-05-28 ENCOUNTER — Other Ambulatory Visit: Payer: Medicaid Other | Admitting: Pharmacist

## 2022-05-28 NOTE — Patient Instructions (Addendum)
Kaimen,   It was great talking with you today!  Check your blood pressure once daily, and any time you have concerning symptoms like headache, chest pain, dizziness, shortness of breath, or vision changes.   Our goal is less than 130/80.  To appropriately check your blood pressure, make sure you do the following:  1) Avoid caffeine, exercise, or tobacco products for 30 minutes before checking. Empty your bladder. 2) Sit with your back supported in a flat-backed chair. Rest your arm on something flat (arm of the chair, table, etc). 3) Sit still with your feet flat on the floor, resting, for at least 5 minutes.  4) Check your blood pressure. Take 1-2 readings.  5) Write down these readings and bring with you to any provider appointments.  Bring your home blood pressure machine with you to a provider's office for accuracy comparison at least once a year.   Make sure you take your blood pressure medications before you come to any office visit, even if you were asked to fast for labs.   Take care!  Catie Eppie [facility], PharmD, BCACP, CPP [facility] [phone]

## 2022-05-28 NOTE — Progress Notes (Signed)
05/28/2022 Name: Gene Reed MRN: [id number] DOB: 25-Oct-1970  Chief Complaint  Patient presents with   Hypertension   Medication Management    Gene Reed is a 52 y.o. year old male who presented for a telephone visit.   They were referred to the pharmacist by their PCP for assistance in managing hypertension.   Patient is participating in a Managed Medicaid Plan:  Yes  Subjective:  Care Team: Primary Care Provider: Ivonne Andrew, NP ; Next Scheduled Visit: 05/31/22  Medication Access/Adherence  Current Pharmacy:  [facility] [address], [address] [place] [address] Phone: [phone] Fax: [phone]  [facility] [address] [place] [address] Phone: [phone] Fax: [phone]  [id number] - [place], [place] - [address] [place] [address] Phone: [phone] Fax: [phone]   Patient reports affordability concerns with their medications: No  Patient reports access/transportation concerns to their pharmacy: No  Patient reports adherence concerns with their medications:  No     Hypertension:  Current medications: carvedilol 25 mg twice daily Prior medications: previously on amlodipine 10 mg daily, was discontinued due to orthostatic hypotension, along with isosorbide at that time  Patient has a validated, automated, upper arm home BP cuff Current blood pressure readings readings: not checking unless he feels poorly  Patient denies hypotensive s/sx including dizziness, lightheadedness.  Patient denies hypertensive symptoms including headache, chest pain, shortness of breath  Current meal patterns:  - Breakfast: sometimes fruit; not a lot - Lunch: sandwich; avoids fries/chips/salty food - Supper: chicken; salad;  - Drinks: water mostly; avoiding dark sodas; every now and then   Physical Activity: reports spending  more time outside on the yard  Hyperlipidemia/ASCVD Risk Reduction  Current lipid lowering medications: rosuvastatin 40 mg daily  Antiplatelet regimen: ticagrelor 90 mg twice daily  Objective:  Lab Results  Component Value Date   HGBA1C 4.9 01/23/2022    Lab Results  Component Value Date   CREATININE 0.72 (L) 04/03/2022   BUN 9 04/03/2022   NA 138 04/03/2022   K 4.2 04/03/2022   CL 102 04/03/2022   CO2 20 04/03/2022    Lab Results  Component Value Date   CHOL 181 04/06/2021   HDL 42 04/06/2021   LDLCALC 97 04/06/2021   TRIG 245 (H) 04/06/2021   CHOLHDL 4.3 04/06/2021    Medications Reviewed Today     Reviewed by Alden Hipp, RPH-CPP (Pharmacist) on 05/28/22 at 1023  Med List Status: <None>   Medication Order Taking? Sig Documenting Provider Last Dose Status Informant  acetaminophen-codeine (TYLENOL #3) 300-30 MG tablet [id number]  Take 1 tablet by mouth every 6 (six) hours.   Active   allopurinol (ZYLOPRIM) 100 MG tablet [id number] Yes Take 1 tablet (100 mg total) by mouth daily. Ivonne Andrew, NP Taking Active   carvedilol (COREG) 25 MG tablet [id number] Yes Take 1 tablet (25 mg total) by mouth 2 (two) times daily. Azalee Course, [place] Taking Active   ferrous sulfate (FEROSUL) 325 (65 FE) MG tablet [id number] No Take 1 tablet (325 mg total) by mouth every other day.  Patient not taking: Reported on 04/03/2022   Ivonne Andrew, NP Not Taking Active   nitroGLYCERIN (NITROSTAT) 0.4 MG SL tablet [id number]  Place 1 tablet (0.4 mg total) under the tongue every 5 (five) minutes as needed for chest pain.  Patient not taking: Reported on 03/01/2022  Corrin Parker, PA-C  Expired 02/27/22 2359 Self, Pharmacy Records           Med Note [name], [name]   Tue May 28, 2022 10:23 AM)    omeprazole (PRILOSEC) 40 MG capsule [id number] Yes Take 1 capsule (40 mg total) by mouth continuous as needed. Meredith Pel, NP Taking Active   rosuvastatin (CRESTOR) 40 MG tablet  [id number] Yes Take 1 tablet (40 mg total) by mouth daily. Laurann Montana, PA-C Taking Active Self, Pharmacy Records  ticagrelor [facility]) 90 MG TABS tablet [id number] Yes Take 1 tablet (90 mg total) by mouth 2 (two) times daily. Laurann Montana, PA-C Taking Active Self, Pharmacy Records              Assessment/Plan:   Hypertension: - Currently uncontrolled - Reviewed long term cardiovascular and renal outcomes of uncontrolled blood pressure - Reviewed appropriate blood pressure monitoring technique and reviewed goal blood pressure. Recommended to check home blood pressure and heart rate daily, document readings, and bring to upcoming PCP appointment.  - Recommend to restart amlodipine at 5 mg daily dose if clinic BP and home BP remain elevated.   Hyperlipidemia/ASCVD Risk Reduction: - Currently uncontrolled per last lipid panel, but recommend updated lab work - Recommend to continue current regimen at this time. Goal LDL likely <70 given prior documented CV disease.   Follow Up Plan: follow up with PCP later this week  Catie Eppie [facility], PharmD, BCACP, CPP [facility] [phone]

## 2022-05-31 ENCOUNTER — Other Ambulatory Visit: Payer: Self-pay

## 2022-05-31 ENCOUNTER — Other Ambulatory Visit: Payer: Medicaid Other | Admitting: Pharmacist

## 2022-05-31 ENCOUNTER — Encounter: Payer: Self-pay | Admitting: Nurse Practitioner

## 2022-05-31 ENCOUNTER — Ambulatory Visit (INDEPENDENT_AMBULATORY_CARE_PROVIDER_SITE_OTHER): Payer: Medicaid Other | Admitting: Nurse Practitioner

## 2022-05-31 VITALS — BP 156/86 | HR 73 | Temp 97.1°F | Wt 273.4 lb

## 2022-05-31 DIAGNOSIS — Z13220 Encounter for screening for lipoid disorders: Secondary | ICD-10-CM

## 2022-05-31 DIAGNOSIS — I10 Essential (primary) hypertension: Secondary | ICD-10-CM | POA: Diagnosis not present

## 2022-05-31 DIAGNOSIS — Z1211 Encounter for screening for malignant neoplasm of colon: Secondary | ICD-10-CM | POA: Diagnosis not present

## 2022-05-31 MED ORDER — AMLODIPINE BESYLATE 5 MG PO TABS
5.0000 mg | ORAL_TABLET | Freq: Every day | ORAL | 1 refills | Status: DC
  Filled 2022-05-31: qty 90, 90d supply, fill #0
  Filled 2022-09-26: qty 90, 90d supply, fill #1

## 2022-05-31 NOTE — Assessment & Plan Note (Signed)
-   Cologuard   2. Primary hypertension  - CBC - Comprehensive metabolic panel   3. Lipid screening  - Lipid Panel   Follow up:  Follow up in 6 months

## 2022-05-31 NOTE — Progress Notes (Signed)
@Patient  ID: Gene Reed, male    DOB: 17-Apr-1970, 52 y.o.   MRN: [id number]  Chief Complaint  Patient presents with   Hypertension    Follow up   Hyperlipidemia    Follow up    Referring provider: Ivonne Andrew, NP   HPI  Patient presents today for follow-up on hypertension and hyperlipidemia.  He will need labs today.  Patient states that he continues to stay out of work after syncopal episode last year.  He was followed by cardiology but failed to make his last follow-up visit.  We gave him the number to the cardiology office advised him to call for follow-up as soon as possible to be cleared to return to work.  Patient has been doing well with no syncopal episodes this year. Denies f/c/s, n/v/d, hemoptysis, PND, leg swelling Denies chest pain or edema      Allergies  Allergen Reactions   Poison Ivy Extract Itching    Poison oak    Immunization History  Administered Date(s) Administered   PFIZER(Purple Top)SARS-COV-2 Vaccination 06/22/2019, 07/25/2019   Tdap 09/21/2015    Past Medical History:  Diagnosis Date   Adrenal nodule (HCC)    Arthritis    Bilateral chronic knee pain    Bone spur    CAD (coronary artery disease)    Gout    Hyperlipidemia    Hypertension     Tobacco History: Social History   Tobacco Use  Smoking Status Former  Smokeless Tobacco Never   Counseling given: Not Answered   Outpatient Encounter Medications as of 05/31/2022  Medication Sig   acetaminophen-codeine (TYLENOL #3) 300-30 MG tablet Take 1 tablet by mouth every 6 (six) hours.   allopurinol (ZYLOPRIM) 100 MG tablet Take 1 tablet (100 mg total) by mouth daily.   carvedilol (COREG) 25 MG tablet Take 1 tablet (25 mg total) by mouth 2 (two) times daily.   omeprazole (PRILOSEC) 40 MG capsule Take 1 capsule (40 mg total) by mouth continuous as needed.   rosuvastatin (CRESTOR) 40 MG tablet Take 1 tablet (40 mg total) by mouth daily.   ticagrelor (BRILINTA) 90 MG TABS tablet  Take 1 tablet (90 mg total) by mouth 2 (two) times daily.   ferrous sulfate (FEROSUL) 325 (65 FE) MG tablet Take 1 tablet (325 mg total) by mouth every other day. (Patient not taking: Reported on 04/03/2022)   nitroGLYCERIN (NITROSTAT) 0.4 MG SL tablet Place 1 tablet (0.4 mg total) under the tongue every 5 (five) minutes as needed for chest pain. (Patient not taking: Reported on 03/01/2022)   No facility-administered encounter medications on file as of 05/31/2022.     Review of Systems  Review of Systems  Constitutional: Negative.   HENT: Negative.    Cardiovascular: Negative.   Gastrointestinal: Negative.   Allergic/Immunologic: Negative.   Neurological: Negative.   Psychiatric/Behavioral: Negative.         Physical Exam  BP (!) 156/86   Pulse 73   Temp (!) 97.1 F (36.2 C)   Wt 273 lb 6.4 oz (124 kg)   SpO2 99%   BMI 40.37 kg/m   Wt Readings from Last 5 Encounters:  05/31/22 273 lb 6.4 oz (124 kg)  04/03/22 280 lb 3.2 oz (127.1 kg)  04/03/22 279 lb (126.6 kg)  03/08/22 271 lb (122.9 kg)  03/06/22 275 lb (124.7 kg)     Physical Exam Vitals and nursing note reviewed.  Constitutional:      General: He is not  in acute distress.    Appearance: He is well-developed.  Cardiovascular:     Rate and Rhythm: Normal rate and regular rhythm.  Pulmonary:     Effort: Pulmonary effort is normal.     Breath sounds: Normal breath sounds.  Skin:    General: Skin is warm and dry.  Neurological:     Mental Status: He is alert and oriented to person, place, and time.      Lab Results:  CBC    Component Value Date/Time   WBC 10.1 04/03/2022 1401   WBC 10.2 03/19/2022 1214   RBC 4.67 04/03/2022 1401   RBC 4.57 03/19/2022 1214   HGB 13.1 04/03/2022 1401   HCT 39.9 04/03/2022 1401   PLT 215 04/03/2022 1401   MCV 85 04/03/2022 1401   MCH 28.1 04/03/2022 1401   MCH 25.6 (L) 01/27/2022 2000   MCHC 32.8 04/03/2022 1401   MCHC 34.6 03/19/2022 1214   RDW 19.1 (H)  04/03/2022 1401   LYMPHSABS 2.8 02/05/2022 1016   LYMPHSABS 3.0 08/13/2019 0936   MONOABS 0.7 02/05/2022 1016   EOSABS 0.2 02/05/2022 1016   EOSABS 0.3 08/13/2019 0936   BASOSABS 0.1 02/05/2022 1016   BASOSABS 0.1 08/13/2019 0936    BMET    Component Value Date/Time   NA 138 04/03/2022 1401   K 4.2 04/03/2022 1401   CL 102 04/03/2022 1401   CO2 20 04/03/2022 1401   GLUCOSE 114 (H) 04/03/2022 1401   GLUCOSE 104 (H) 01/27/2022 2000   BUN 9 04/03/2022 1401   CREATININE 0.72 (L) 04/03/2022 1401   CREATININE 0.66 11/20/2016 1051   CALCIUM 9.2 04/03/2022 1401   GFRNONAA >60 01/27/2022 2000   GFRNONAA 117 11/20/2016 1051   GFRAA 133 11/12/2019 0925   GFRAA 135 11/20/2016 1051     Assessment & Plan:   Colon cancer screening - Cologuard   2. Primary hypertension  - CBC - Comprehensive metabolic panel   3. Lipid screening  - Lipid Panel   Follow up:  Follow up in 6 months     Ivonne Andrew, NP 05/31/2022

## 2022-05-31 NOTE — Patient Instructions (Addendum)
1. Colon cancer screening  - Cologuard   2. Primary hypertension  - CBC - Comprehensive metabolic panel   3. Lipid screening  - Lipid Panel   Follow up:  Follow up in 6 months

## 2022-05-31 NOTE — Progress Notes (Signed)
Care Coordination Call  BP elevated at the office today. Discussed with PCP. Start amlodipine 5 mg daily. Check home BP readings at least once daily. Prescription sent to the pharmacy.   Follow up in office in 2 weeks for nurse BP check as scheduled.   Catie Eppie Gibson, PharmD, BCACP, CPP [facility] [phone]

## 2022-06-01 LAB — CBC
Hematocrit: 37.7 % (ref 37.5–51.0)
Hemoglobin: 12.8 g/dL — ABNORMAL LOW (ref 13.0–17.7)
MCH: 31.1 pg (ref 26.6–33.0)
MCHC: 34 g/dL (ref 31.5–35.7)
MCV: 92 fL (ref 79–97)
Platelets: 210 10*3/uL (ref 150–450)
RBC: 4.11 x10E6/uL — ABNORMAL LOW (ref 4.14–5.80)
RDW: 12.8 % (ref 11.6–15.4)
WBC: 8.9 10*3/uL (ref 3.4–10.8)

## 2022-06-01 LAB — COMPREHENSIVE METABOLIC PANEL
ALT: 45 IU/L — ABNORMAL HIGH (ref 0–44)
AST: 58 IU/L — ABNORMAL HIGH (ref 0–40)
Albumin/Globulin Ratio: 1.2 (ref 1.2–2.2)
Albumin: 4.2 g/dL (ref 3.8–4.9)
Alkaline Phosphatase: 159 IU/L — ABNORMAL HIGH (ref 44–121)
BUN/Creatinine Ratio: 15 (ref 9–20)
BUN: 9 mg/dL (ref 6–24)
Bilirubin Total: 0.5 mg/dL (ref 0.0–1.2)
CO2: 18 mmol/L — ABNORMAL LOW (ref 20–29)
Calcium: 8.9 mg/dL (ref 8.7–10.2)
Chloride: 108 mmol/L — ABNORMAL HIGH (ref 96–106)
Creatinine, Ser: 0.61 mg/dL — ABNORMAL LOW (ref 0.76–1.27)
Globulin, Total: 3.5 g/dL (ref 1.5–4.5)
Glucose: 133 mg/dL — ABNORMAL HIGH (ref 70–99)
Potassium: 3.9 mmol/L (ref 3.5–5.2)
Sodium: 142 mmol/L (ref 134–144)
Total Protein: 7.7 g/dL (ref 6.0–8.5)
eGFR: 116 mL/min/{1.73_m2} (ref >59)

## 2022-06-01 LAB — LIPID PANEL
Chol/HDL Ratio: 3.1 ratio (ref 0.0–5.0)
Cholesterol, Total: 119 mg/dL (ref 100–199)
HDL: 39 mg/dL — ABNORMAL LOW (ref >39)
LDL Chol Calc (NIH): 42 mg/dL (ref 0–99)
Triglycerides: 246 mg/dL — ABNORMAL HIGH (ref 0–149)
VLDL Cholesterol Cal: 38 mg/dL (ref 5–40)

## 2022-06-03 ENCOUNTER — Other Ambulatory Visit: Payer: Self-pay

## 2022-06-03 ENCOUNTER — Other Ambulatory Visit: Payer: Self-pay | Admitting: Nurse Practitioner

## 2022-06-03 ENCOUNTER — Other Ambulatory Visit (HOSPITAL_COMMUNITY): Payer: Self-pay

## 2022-06-03 MED ORDER — OMEGA-3-ACID ETHYL ESTERS 1 G PO CAPS
1.0000 g | ORAL_CAPSULE | Freq: Every day | ORAL | 2 refills | Status: DC
  Filled 2022-06-03: qty 30, 30d supply, fill #0
  Filled 2022-06-28: qty 30, 30d supply, fill #1
  Filled 2022-08-13: qty 30, 30d supply, fill #2

## 2022-06-04 ENCOUNTER — Other Ambulatory Visit: Payer: Self-pay

## 2022-06-05 ENCOUNTER — Encounter: Payer: Self-pay | Admitting: Nurse Practitioner

## 2022-06-05 ENCOUNTER — Ambulatory Visit (INDEPENDENT_AMBULATORY_CARE_PROVIDER_SITE_OTHER): Payer: Medicaid Other | Admitting: Nurse Practitioner

## 2022-06-05 ENCOUNTER — Other Ambulatory Visit: Payer: Self-pay

## 2022-06-05 ENCOUNTER — Other Ambulatory Visit (INDEPENDENT_AMBULATORY_CARE_PROVIDER_SITE_OTHER): Payer: Medicaid Other

## 2022-06-05 ENCOUNTER — Telehealth: Payer: Self-pay

## 2022-06-05 VITALS — BP 150/90 | HR 81 | Ht 69.0 in | Wt 275.0 lb

## 2022-06-05 DIAGNOSIS — R7989 Other specified abnormal findings of blood chemistry: Secondary | ICD-10-CM

## 2022-06-05 DIAGNOSIS — R748 Abnormal levels of other serum enzymes: Secondary | ICD-10-CM

## 2022-06-05 DIAGNOSIS — K219 Gastro-esophageal reflux disease without esophagitis: Secondary | ICD-10-CM | POA: Diagnosis not present

## 2022-06-05 DIAGNOSIS — D509 Iron deficiency anemia, unspecified: Secondary | ICD-10-CM

## 2022-06-05 DIAGNOSIS — Z1211 Encounter for screening for malignant neoplasm of colon: Secondary | ICD-10-CM | POA: Diagnosis not present

## 2022-06-05 LAB — HEPATIC FUNCTION PANEL
ALT: 57 U/L — ABNORMAL HIGH (ref 0–53)
AST: 87 U/L — ABNORMAL HIGH (ref 0–37)
Albumin: 4.3 g/dL (ref 3.5–5.2)
Alkaline Phosphatase: 125 U/L — ABNORMAL HIGH (ref 39–117)
Bilirubin, Direct: 0.2 mg/dL (ref 0.0–0.3)
Total Bilirubin: 0.7 mg/dL (ref 0.2–1.2)
Total Protein: 8.1 g/dL (ref 6.0–8.3)

## 2022-06-05 LAB — GAMMA GT: GGT: 936 U/L — ABNORMAL HIGH (ref 7–51)

## 2022-06-05 LAB — FERRITIN: Ferritin: 176.7 ng/mL (ref 22.0–322.0)

## 2022-06-05 MED ORDER — NA SULFATE-K SULFATE-MG SULF 17.5-3.13-1.6 GM/177ML PO SOLN
1.0000 | ORAL | 0 refills | Status: DC
  Filled 2022-06-05: qty 344, 2d supply, fill #0

## 2022-06-05 NOTE — Patient Instructions (Addendum)
_______________________________________________________  If your blood pressure at your visit was 140/90 or greater, please contact your primary care physician to follow up on this.  If you are age 51 or younger, your body mass index should be between 19-25. Your Body mass index is 40.61 kg/m. If this is out of the aformentioned range listed, please consider follow up with your Primary Care Provider.  ________________________________________________________  The [facility] providers would like to encourage you to use [facility] to communicate with providers for non-urgent requests or questions.  Due to long hold times on the telephone, sending your provider a message by [facility] may be a faster and more efficient way to get a response.  Please allow 48 business hours for a response.  Please remember that this is for non-urgent requests.  _______________________________________________________  Your provider has requested that you go to the basement level for lab work before leaving today. Press "B" on the elevator. The lab is located at the first door on the left as you exit the elevator.  You have been scheduled for a colonoscopy. Please follow written instructions given to you at your visit today.  Please pick up your prep supplies at the pharmacy within the next 1-3 days. If you use inhalers (even only as needed), please bring them with you on the day of your procedure.  Due to recent changes in healthcare laws, you may see the results of your imaging and laboratory studies on MyChart before your provider has had a chance to review them.  We understand that in some cases there may be results that are confusing or concerning to you. Not all laboratory results come back in the same time frame and the provider may be waiting for multiple results in order to interpret others.  Please give [place] 48 hours in order for your provider to thoroughly review all the results before contacting the office for  clarification of your results.   Thank you for entrusting me with your care and choosing [facility]  Willette Cluster, NP

## 2022-06-05 NOTE — Telephone Encounter (Signed)
[facility] Pre-operative Risk Assessment     Request for surgical clearance:     Endoscopy Procedure  What type of surgery is being performed?     Colonoscopy  When is this surgery scheduled?     06-24-22  What type of clearance is required ?   Pharmacy  Are there any medications that need to be held prior to surgery and how long? Brilinta x 5 days  Practice name and name of physician performing surgery?      [facility]  What is your office phone and fax number?      Phone- [phone]  Fax- [phone]  Anesthesia type (None, local, MAC, general) ?       MAC

## 2022-06-05 NOTE — Progress Notes (Signed)
Assessment and Plan   Primary Gi: Doristine Locks, MD  Brief Narrative:  52 y.o. yo male with a past medical history of, but not limited to, HTN, gout, GI bleed / small bowel AVM.   Mildly elevated liver enzymes and alk phos.  Unclear etiology, possibly steatosis + Etoh, RUQ US shows hepatic steatosis or other underlying intrinsic liver disease. -Will obtain complete hepatic serologic workup  -Discussed need for weight loss. Recommended low carb diet -Etoh abstinence ( at least for now until we can sort through liver test abnormalities).  -I will follow up with him after labs results available  Chronic anemia, ? Etiology.  Acute worsening  in Dec 2023 during GI bleed. He was iron deficient at the time. Got 2 units of blood and then took oral iron until around February 2023  Most recent hgb 05/31/22 with hgb of 12.8   GERD, occasional heartburn.  Controlled with Omeprazole as needed.   CAD s/p DES early Nov 2023. On Brillinta.    Colon cancer screening.   No rectal bleeding or [facility] of colon cancer.  -He is supposed to be doing a Cologuard study.  I recommended a colonoscopy instead if Cardiology will allow him to hold Brilinta.  He is approaching the 69-month mark since his stent was placed. The risks and benefits of colonoscopy with possible polypectomy / biopsies were discussed and the patient agrees to proceed.   -Hold Brillinta for 5 days before procedure - will instruct when and how to resume after procedure. Patient understands that there is a low but real risk of cardiovascular event such as heart attack, stroke, or embolism /  thrombosis, or ischemia while off Brillinta. The patient consents to proceed. Will communicate by phone or EMR with patient's prescribing provider Dr. Tresa Endo to confirm that holding Gene Reed is reasonable in this case.  HTN.  BP still elevated at 152/90 but better than when last here.      History of Present Illness   Chief complaint:  follow up on  liver tests  Gene Reed was last seen 04/03/2022 for hospital follow up on GI bleed secondary to small bowel AMV.   Refer to that office note for detail. In summary, I noticed that his alk phos and AST had been mildly elevated. Labs following that visit were remarkable for persistently abnormal liver tests. US done and showed hepatic steatosis other underlying intrinsic liver disease. He had repeat LFTs  05/31/22 and still has a mild elevation in alk phos and AST. His ALT is mildly elevated as well. He feels okay. No abdominal pain. He is actively drinking Etoh.    Imaging   [place] 04/15/22    EXAM: RUQ ULTRASOUND ABDOMEN   COMPARISON:  None Available.   FINDINGS: Gallbladder:   Poorly distended with no obvious abnormalities.   Common bile duct:   Diameter: 2.6 mm   Liver:   Diffuse increased echogenicity throughout the liver. No liver mass identified. Portal vein is patent on color Doppler imaging with normal direction of blood flow towards the liver.   Other: None.   IMPRESSION: 1. Diffuse increased echogenicity throughout the liver is nonspecific and may be seen with hepatic steatosis or other underlying intrinsic liver disease. 2. No other abnormalities identified.   Labs:       Latest Ref Rng & Units 05/31/2022   11:45 AM 04/03/2022    2:01 PM 03/01/2022    3:13 PM  Hepatic Function  Total Protein 6.0 - 8.5 g/dL  7.7  8.2  8.0   Albumin 3.8 - 4.9 g/dL 4.2  4.7  4.7   AST 0 - 40 IU/L 58  56  47   ALT 0 - 44 IU/L 45  40  35   Alk Phosphatase 44 - 121 IU/L 159  174  150   Total Bilirubin 0.0 - 1.2 mg/dL 0.5  0.8  0.7        Latest Ref Rng & Units 05/31/2022   11:45 AM 04/03/2022    2:01 PM 03/19/2022   12:14 PM  CBC  WBC 3.4 - 10.8 x10E3/uL 8.9  10.1  10.2   Hemoglobin 13.0 - 17.7 g/dL 16.1  09.6  04.5   Hematocrit 37.5 - 51.0 % 37.7  39.9  37.7   Platelets 150 - 450 x10E3/uL 210  215  231.0      Past Medical History:  Diagnosis Date   Adrenal nodule (HCC)     Arthritis    Bilateral chronic knee pain    Bone spur    CAD (coronary artery disease)    Gout    Hyperlipidemia    Hypertension     Past Surgical History:  Procedure Laterality Date   CORONARY LITHOTRIPSY N/A 12/06/2021   Procedure: CORONARY LITHOTRIPSY;  Surgeon: Lennette Bihari, MD;  Location: [facility] INVASIVE CV LAB;  Service: Cardiovascular;  Laterality: N/A;   CORONARY STENT INTERVENTION Right 12/06/2021   Procedure: CORONARY STENT INTERVENTION;  Surgeon: Lennette Bihari, MD;  Location: [facility] INVASIVE CV LAB;  Service: Cardiovascular;  Laterality: Right;   ESOPHAGOGASTRODUODENOSCOPY Left 01/23/2022   Procedure: ESOPHAGOGASTRODUODENOSCOPY (EGD);  Surgeon: Shellia Cleverly, DO;  Location: [facility] ENDOSCOPY;  Service: Gastroenterology;  Laterality: Left;   HOT HEMOSTASIS N/A 01/23/2022   Procedure: HOT HEMOSTASIS (ARGON PLASMA COAGULATION/BICAP);  Surgeon: Shellia Cleverly, DO;  Location: [facility] ENDOSCOPY;  Service: Gastroenterology;  Laterality: N/A;   KNEE SURGERY     LEFT HEART CATH AND CORONARY ANGIOGRAPHY N/A 12/06/2021   Procedure: LEFT HEART CATH AND CORONARY ANGIOGRAPHY;  Surgeon: Lennette Bihari, MD;  Location: [facility] INVASIVE CV LAB;  Service: Cardiovascular;  Laterality: N/A;    Current Medications, Allergies, Family History and Social History were reviewed in [employer] record.     Current Outpatient Medications  Medication Sig Dispense Refill   acetaminophen-codeine (TYLENOL #3) 300-30 MG tablet Take 1 tablet by mouth every 6 (six) hours. 12 tablet 0   allopurinol (ZYLOPRIM) 100 MG tablet Take 1 tablet (100 mg total) by mouth daily. 90 tablet 3   amLODipine (NORVASC) 5 MG tablet Take 1 tablet (5 mg total) by mouth daily. 90 tablet 1   carvedilol (COREG) 25 MG tablet Take 1 tablet (25 mg total) by mouth 2 (two) times daily. 180 tablet 3   ferrous sulfate (FEROSUL) 325 (65 FE) MG tablet Take 1 tablet (325 mg total) by mouth every other day. (Patient not taking:  Reported on 04/03/2022) 30 tablet 0   nitroGLYCERIN (NITROSTAT) 0.4 MG SL tablet Place 1 tablet (0.4 mg total) under the tongue every 5 (five) minutes as needed for chest pain. (Patient not taking: Reported on 03/01/2022) 25 tablet 3   omega-3 acid ethyl esters (LOVAZA) 1 g capsule Take 1 capsule (1 g total) by mouth daily. 30 capsule 2   omeprazole (PRILOSEC) 40 MG capsule Take 1 capsule (40 mg total) by mouth continuous as needed. 90 capsule 3   rosuvastatin (CRESTOR) 40 MG tablet Take 1 tablet (40 mg total) by  mouth daily. 30 tablet 5   ticagrelor (BRILINTA) 90 MG TABS tablet Take 1 tablet (90 mg total) by mouth 2 (two) times daily. 60 tablet 11   No current facility-administered medications for this visit.    Review of Systems: No chest pain. No shortness of breath. No urinary complaints.    Physical Exam  Wt Readings from Last 3 Encounters:  05/31/22 273 lb 6.4 oz (124 kg)  04/03/22 280 lb 3.2 oz (127.1 kg)  04/03/22 279 lb (126.6 kg)    BP (!) 152/90   Pulse 81   Ht 5\' 9"  (1.753 m)   Wt 275 lb (124.7 kg)   BMI 40.61 kg/m  Constitutional:  Pleasant, obese male in no acute distress. Psychiatric: Normal mood and affect. Behavior is normal. EENT: Pupils normal.  Conjunctivae are normal. No scleral icterus. Neck supple.  Cardiovascular: Normal rate, regular rhythm.  Pulmonary/chest: Effort normal and breath sounds normal. No wheezing, rales or rhonchi. Abdominal: Soft, nondistended, nontender. Bowel sounds active throughout. There are no masses palpable. No hepatomegaly. Neurological: Alert and oriented to person place and time.  Skin: Skin is warm and dry. No rashes noted.  Willette Cluster, NP  06/05/2022, 8:14 AM  Cc:  Ivonne Andrew, NP

## 2022-06-05 NOTE — Telephone Encounter (Signed)
Good morning Dr. Tresa Endo,  Gene Reed is scheduled to undergo colonoscopy 06/24/2022 and we have received request to hold his Brilinta for 5 days prior to procedure.  He has a past medical history of CAD s/p LHC with difficult PCI placed to severely calcified proximal RCA with DES x 1 and scoring balloon with 40% and 75% mid LAD stenosis that is being treated medically.  Can you please send your recommendations to P [facility] PREOP.  Thank you, Robin Searing, NP

## 2022-06-06 LAB — CERULOPLASMIN: Ceruloplasmin: 24 mg/dL (ref 18–36)

## 2022-06-06 LAB — IRON AND TIBC
Iron Saturation: 25 % (ref 15–55)
Iron: 82 ug/dL (ref 38–169)
Total Iron Binding Capacity: 331 ug/dL (ref 250–450)
UIBC: 249 ug/dL (ref 111–343)

## 2022-06-06 LAB — TISSUE TRANSGLUTAMINASE, IGA: (tTG) Ab, IgA: <1 U/mL

## 2022-06-10 LAB — HEPATITIS B SURFACE ANTIBODY,QUALITATIVE: Hep B S Ab: NONREACTIVE

## 2022-06-10 LAB — IGA: Immunoglobulin A: 502 mg/dL — ABNORMAL HIGH (ref 47–310)

## 2022-06-10 LAB — HEPATITIS A ANTIBODY, TOTAL: Hepatitis A AB,Total: REACTIVE — AB

## 2022-06-10 LAB — ALPHA-1-ANTITRYPSIN: A-1 Antitrypsin, Ser: 116 mg/dL (ref 83–199)

## 2022-06-10 LAB — ANTI-SMOOTH MUSCLE ANTIBODY, IGG: Actin (Smooth Muscle) Antibody (IGG): 60 U — ABNORMAL HIGH (ref <20)

## 2022-06-10 LAB — MITOCHONDRIAL ANTIBODIES: Mitochondrial M2 Ab, IgG: <=20 U (ref <=20.0)

## 2022-06-10 LAB — ANA: Anti Nuclear Antibody (ANA): NEGATIVE

## 2022-06-10 NOTE — Telephone Encounter (Signed)
With complex PCI in November 2023, unless colonoscopy is absolutely essential presently, would defer until November and can hold brillinta for 5 day pre-procedure then.

## 2022-06-11 ENCOUNTER — Other Ambulatory Visit: Payer: Self-pay

## 2022-06-11 NOTE — Telephone Encounter (Signed)
Pre-op team,  Will you please contact patient to let him know Dr. Landry Dyke recommendation to defer colonoscopy until after November given his complex PCI performed in November 2023?   Thank you!  DW

## 2022-06-11 NOTE — Telephone Encounter (Signed)
Call placed to pt regarding Dr. Landry Dyke recommendations.  Pt states that it is just a screening and he is aware that we will fax back to the requesting surgeon's office to make them aware.

## 2022-06-11 NOTE — Telephone Encounter (Signed)
Gunnar Fusi,  Per Dr Tresa Endo colonoscopy should be deferred until November 2024 unless colonoscopy is considered essential.  He can hold Brilinta for 5 days prior to procedure at that time.   I will cancel procedure scheduled with Dr Barron Alvine.  Please advise.

## 2022-06-12 NOTE — Progress Notes (Signed)
Cardiology Clinic Note   Patient Name: Gene Reed Date of Encounter: 06/14/2022  Primary Care Provider:  Ivonne Andrew, NP Primary Cardiologist:  Nicki Guadalajara, MD  Patient Profile     Gene Reed 52 year old male presents to the clinic today for follow-up evaluation of his coronary artery disease Gene Reed.  Past Medical History    Past Medical History:  Diagnosis Date   Adrenal nodule (HCC)    Arthritis    Bilateral chronic knee Reed    Bone spur    Gene Reed (coronary artery disease)    Gout    Hyperlipidemia    Hypertension    Past Surgical History:  Procedure Laterality Date   CORONARY LITHOTRIPSY N/A 12/06/2021   Procedure: CORONARY LITHOTRIPSY;  Surgeon: Lennette Bihari, MD;  Location: [facility] INVASIVE CV LAB;  Service: Cardiovascular;  Laterality: N/A;   CORONARY STENT INTERVENTION Right 12/06/2021   Procedure: CORONARY STENT INTERVENTION;  Surgeon: Lennette Bihari, MD;  Location: [facility] INVASIVE CV LAB;  Service: Cardiovascular;  Laterality: Right;   ESOPHAGOGASTRODUODENOSCOPY Left 01/23/2022   Procedure: ESOPHAGOGASTRODUODENOSCOPY (EGD);  Surgeon: Shellia Cleverly, DO;  Location: [facility] ENDOSCOPY;  Service: Gastroenterology;  Laterality: Left;   HOT HEMOSTASIS N/A 01/23/2022   Procedure: HOT HEMOSTASIS (ARGON PLASMA COAGULATION/BICAP);  Surgeon: Shellia Cleverly, DO;  Location: [facility] ENDOSCOPY;  Service: Gastroenterology;  Laterality: N/A;   KNEE SURGERY     LEFT HEART CATH Gene CORONARY ANGIOGRAPHY N/A 12/06/2021   Procedure: LEFT HEART CATH Gene CORONARY ANGIOGRAPHY;  Surgeon: Lennette Bihari, MD;  Location: [facility] INVASIVE CV LAB;  Service: Cardiovascular;  Laterality: N/A;    Allergies  Allergies  Allergen Reactions   Poison Ivy Extract Itching    Poison oak    History of Present Illness     Gene Reed, Gene Reed, Gene Reed, Gene Reed, Gene Reed, Gene Reed, Gene Reed, Gene Reed, Gene Reed, Gene Reed, Gene Reed.  He was  initially seen by Dr. Tresa Endo on 8/23 for evaluation of chest discomfort.  His chest discomfort at that time was felt to be MSK in nature.  His coronary calcium score was noted to be 1803.  He underwent cardiac catheterization 12/06/2021 which showed 15% mid-distal left main, 90% proximal RCA followed by 20% stenosis of proximal-mid RCA, 40% proximal mid LAD followed by 75% Gene 40% stenosis of mid LAD Gene 20% stenosis in proximal left circumflex along with 20% OM 2.  He underwent difficult PCI of his severely calcified proximal RCA with PTCA scoring balloon Gene DES.  LAD was treated medically.  He was placed on aspirin, Imdur Gene Brilinta.  He was continued on CPAP.  He was seen in follow-up by Azalee Course PA-C on 03/06/2022.  During that time he reported that he had done well since discharge.  He did note some fatigue.  His blood pressure was elevated at 160/100.  He reported that his blood pressure had also been high at home.  His carvedilol was increased to 25 mg twice daily.  He reported recurrent dark discoloration in his stool.  He was concern for recurrent GI Reed.  He had been seen by internal medicine 5 days prior Gene his hemoglobin was noted to be 12.  On recheck his hemoglobin improved to 12.8.  Follow-up was planned for 6 weeks.  He presents to the clinic today for follow-up evaluation Gene preoperative cardiac evaluation.  He states he would like to return to work.  He denies further episodes of lightheadedness presyncope or Reed.  We reviewed his cardiac catheterization.  His hemoglobin is stable.  From a heart standpoint he may return to work doing light duty activities lifting less than 20 pounds.  I will provide a work note.  His blood pressure is well-controlled at 128/84.  He reports that he has been trying to increase his physical activity Gene trying to avoid high salt foods.  I will give him a salty 6 diet sheet, have him continue to increase his physical activity Gene plan follow-up in 6  months.  Today he denies chest Reed, shortness of breath, lower extremity edema, fatigue, palpitations, melena, hematuria, hemoptysis, diaphoresis, weakness, presyncope, Reed, orthopnea, Gene PND.       Home Medications    Prior to Admission medications   Medication Sig Start Date End Date Taking? Authorizing Provider  acetaminophen-codeine (TYLENOL #3) 300-30 MG tablet Take 1 tablet by mouth every 6 (six) hours. Patient not taking: Reported on 06/05/2022 03/13/22     allopurinol (ZYLOPRIM) 100 MG tablet Take 1 tablet (100 mg total) by mouth daily. 03/01/22   Ivonne Andrew, NP  amLODipine (NORVASC) 5 MG tablet Take 1 tablet (5 mg total) by mouth daily. 05/31/22   Ivonne Andrew, NP  carvedilol (COREG) 25 MG tablet Take 1 tablet (25 mg total) by mouth 2 (two) times daily. 03/06/22   Azalee Course, PA  Na Sulfate-K Sulfate-Mg Sulf (SUPREP BOWEL PREP KIT) 17.5-3.13-1.6 GM/177ML SOLN Take 1 kit by mouth as directed. 06/05/22   Meredith Pel, NP  nitroGLYCERIN (NITROSTAT) 0.4 MG SL tablet Place 1 tablet (0.4 mg total) under the tongue every 5 (five) minutes as needed for chest Reed. Patient not taking: Reported on 03/01/2022 11/29/21 02/27/22  Marjie Skiff E, PA-C  omega-3 acid ethyl esters (LOVAZA) 1 g capsule Take 1 capsule (1 g total) by mouth daily. 06/03/22   Ivonne Andrew, NP  omeprazole (PRILOSEC) 40 MG capsule Take 1 capsule (40 mg total) by mouth continuous as needed. 04/03/22   Meredith Pel, NP  rosuvastatin (CRESTOR) 40 MG tablet Take 1 tablet (40 mg total) by mouth daily. 12/07/21   Dunn, Tacey Ruiz, PA-C  ticagrelor (BRILINTA) 90 MG TABS tablet Take 1 tablet (90 mg total) by mouth 2 (two) times daily. 12/07/21   Laurann Montana, PA-C    Family History    History reviewed. No pertinent family history. He indicated that his mother is deceased. He indicated that the status of his father is unknown.  Social History    Social History   Socioeconomic History   Marital status:  Widowed    Spouse name: Not on file   Number of children: Not on file   Years of education: Not on file   Highest education level: Not on file  Occupational History   Not on file  Tobacco Reed   Smoking status: Former   Smokeless tobacco: Never  Vaping Reed   Vaping Reed: Never used  Substance Gene Sexual Activity   Gene Reed: Yes    Comment:  6 beers per a day    Drug Reed: Yes    Types: Marijuana    Comment: 1 joint a day    Sexual activity: Not Currently  Other Topics Concern   Not on file  Social History Narrative   Not on file   Social Determinants of Health   Financial Resource Strain: Medium Risk (12/12/2021)   Overall Financial Resource Strain (CARDIA)  Difficulty of Paying Living Expenses: Somewhat hard  Food Insecurity: No Food Insecurity (01/24/2022)   Hunger Vital Sign    Worried About Running Out of Food in the Last Year: Never true    Ran Out of Food in the Last Year: Never true  Transportation Needs: No Transportation Needs (01/24/2022)   PRAPARE - Administrator, Civil Service (Medical): No    Lack of Transportation (Non-Medical): No  Physical Activity: Not on file  Stress: Not on file  Social Connections: Not on file  Intimate Partner Violence: Not At Risk (01/24/2022)   Humiliation, Afraid, Rape, Gene Kick questionnaire    Fear of Current or Ex-Partner: No    Emotionally Abused: No    Physically Abused: No    Sexually Abused: No     Review of Systems    General:  No chills, fever, night sweats or weight changes.  Cardiovascular:  No chest Reed, dyspnea on exertion, edema, orthopnea, palpitations, paroxysmal nocturnal dyspnea. Dermatological: No rash, lesions/masses Respiratory: No cough, dyspnea Urologic: No hematuria, dysuria Abdominal:   No nausea, vomiting, diarrhea, bright red blood per rectum, melena, or hematemesis Neurologic:  No visual changes, wkns, changes in mental status. All other systems reviewed Gene are otherwise  negative except as noted above.  Physical Exam    VS:  BP 128/84   Pulse 77   Ht 5\' 9"  (1.753 m)   Wt 275 lb 3.2 oz (124.8 kg)   SpO2 97%   BMI 40.64 kg/m  , BMI Body mass index is 40.64 kg/m. GEN: Well nourished, well developed, in no acute distress. HEENT: normal. Neck: Supple, no JVD, carotid bruits, or masses. Cardiac: RRR, no murmurs, rubs, or gallops. No clubbing, cyanosis, edema.  Radials/DP/PT 2+ Gene equal bilaterally.  Respiratory:  Respirations regular Gene unlabored, clear to auscultation bilaterally. GI: Soft, nontender, nondistended, BS + x 4. MS: no deformity or atrophy. Skin: warm Gene dry, no rash. Neuro:  Strength Gene sensation are intact. Psych: Normal affect.  Accessory Clinical Findings    Recent Labs: 01/21/2022: TSH 2.320 05/31/2022: BUN 9; Creatinine, Ser 0.61; Hemoglobin 12.8; Platelets 210; Potassium 3.9; Sodium 142 06/05/2022: ALT 57   Recent Lipid Panel    Component Value Date/Time   CHOL 119 05/31/2022 1145   TRIG 246 (H) 05/31/2022 1145   HDL 39 (L) 05/31/2022 1145   CHOLHDL 3.1 05/31/2022 1145   LDLCALC 42 05/31/2022 1145         ECG personally reviewed by me today-  none today.  Echocardiogram 09/28/2021  IMPRESSIONS     1. Left ventricular ejection fraction, by estimation, is 65 to 70%. The  left ventricle has normal function. The left ventricle has no regional  wall motion abnormalities. There is mild left ventricular hypertrophy.  Left ventricular diastolic parameters  were normal.   2. Right ventricular systolic function is normal. The right ventricular  size is normal.   3. The mitral valve is normal in structure. Mild mitral valve  regurgitation.   4. The aortic valve is tricuspid. Aortic valve regurgitation is not  visualized. Aortic valve sclerosis/calcification is present, without any  evidence of aortic stenosis.   5. The inferior vena cava is normal in size with greater than 50%  respiratory variability, suggesting  right atrial pressure of 3 mmHg.   FINDINGS   Left Ventricle: Left ventricular ejection fraction, by estimation, is 65  to 70%. The left ventricle has normal function. The left ventricle has no  regional wall  motion abnormalities. The left ventricular internal cavity  size was normal in size. There is   mild left ventricular hypertrophy. Left ventricular diastolic parameters  were normal.   Right Ventricle: The right ventricular size is normal. Right vetricular  wall thickness was not assessed. Right ventricular systolic function is  normal.   Left Atrium: Left atrial size was normal in size.   Right Atrium: Right atrial size was normal in size.   Pericardium: There is no evidence of pericardial effusion.   Mitral Valve: The mitral valve is normal in structure. Mild mitral valve  regurgitation.   Tricuspid Valve: The tricuspid valve is normal in structure. Tricuspid  valve regurgitation is trivial.   Aortic Valve: The aortic valve is tricuspid. Aortic valve regurgitation is  not visualized. Aortic valve sclerosis/calcification is present, without  any evidence of aortic stenosis. Aortic valve mean gradient measures 6.0  mmHg. Aortic valve peak gradient  measures 12.2 mmHg. Aortic valve area, by VTI measures 2.38 cm.   Pulmonic Valve: The pulmonic valve was not well visualized. Pulmonic valve  regurgitation is trivial. No evidence of pulmonic stenosis.   Aorta: The aortic root Gene ascending aorta are structurally normal, with  no evidence of dilitation.   Venous: The inferior vena cava is normal in size with greater than 50%  respiratory variability, suggesting right atrial pressure of 3 mmHg.   IAS/Shunts: No atrial level shunt detected by color flow Doppler.    Cardiac catheterization 12/06/2021      Prox RCA lesion is 90% stenosed.   Prox RCA to Mid RCA lesion is 20% stenosed.   Mid LM to Dist LM lesion is 15% stenosed.   Prox LAD to Mid LAD lesion is 40%  stenosed.   Mid LAD-1 lesion is 75% stenosed.   Mid LAD-2 lesion is 40% stenosed.   Prox Cx lesion is 20% stenosed.   2nd Mrg lesion is 20% stenosed.   A drug-eluting stent was successfully placed.   Post intervention, there is a 0% residual stenosis.   Multivessel Gene Reed with severely calcified proximal RCA stenosis of 90% followed by mid 20% stenosis in a dominant RCA which supplies the LV apex.   Diffusely calcified proximal LAD stenoses of 40% 75% Gene 40%.   Mild nonobstructive disease in the distal left main Gene left circumflex vessel.   LVEDP 21 mmHg.   Difficult but successful PCI to the severely calcified proximal RCA with PTCA, scoring balloon, Gene intravascular lithotripsy Gene ultimate insertion of a 3.5 x 20 mm Synergy DES stent postdilated to 3.75 mm with the stenosis being reduced to 0% Gene successful lithotripsy with resolution of previous significant calcification.   RECOMMENDATION: DAPT for minimum of 12 months probably longer.  Increase medical therapy for concomitant LAD disease.  If patient has recurrent symptomatology will need staged PCI to LAD.  Diagnostic Dominance: Right  Intervention       Assessment & Plan   1.  Coronary artery disease-no chest Reed today.  Denies recent episodes of arm neck back or chest discomfort.  Underwent PCI with DES to his proximal RCA 11/23.  LAD stenosis was managed medically. Continue aspirin, Brilinta, Heart healthy low-sodium diet  Hyperlipidemia-LDL 42 on 05/31/22 High-fiber diet Continue rosuvastatin  Essential hypertension-BP today 142/88 Gene 128/84. Continue carvedilol Maintain blood pressure log  Preoperative cardiac evaluation-colonoscopy, defer colonoscopy until 11/24, [place]    Patient's aspirin will need to be continued uninterrupted until 11/24   Disposition: Follow-up with Dr. Tresa Endo in 6  months.   Thomasene Ripple. Krystyna Cleckley NP-C     06/14/2022, 3:10 PM [facility]  [facility] [address] Office [phone] Fax [phone]    I spent 14 minutes examining this patient, reviewing medications, Gene using patient centered shared decision making involving her cardiac care.  Prior to her visit I spent greater than 20 minutes reviewing her past medical history,  medications, Gene prior cardiac tests.

## 2022-06-14 ENCOUNTER — Ambulatory Visit: Payer: Medicaid Other | Attending: General Practice | Admitting: General Practice

## 2022-06-14 ENCOUNTER — Ambulatory Visit: Payer: Medicaid Other

## 2022-06-14 ENCOUNTER — Encounter: Payer: Self-pay | Admitting: General Practice

## 2022-06-14 ENCOUNTER — Other Ambulatory Visit: Payer: Self-pay

## 2022-06-14 VITALS — BP 128/84 | HR 77 | Ht 69.0 in | Wt 275.2 lb

## 2022-06-14 DIAGNOSIS — I10 Essential (primary) hypertension: Secondary | ICD-10-CM | POA: Diagnosis not present

## 2022-06-14 DIAGNOSIS — Z01810 Encounter for preprocedural cardiovascular examination: Secondary | ICD-10-CM | POA: Diagnosis not present

## 2022-06-14 DIAGNOSIS — I2510 Atherosclerotic heart disease of native coronary artery without angina pectoris: Secondary | ICD-10-CM | POA: Diagnosis not present

## 2022-06-14 DIAGNOSIS — E785 Hyperlipidemia, unspecified: Secondary | ICD-10-CM

## 2022-06-14 NOTE — Telephone Encounter (Signed)
Not needed

## 2022-06-14 NOTE — Progress Notes (Signed)
Agree with the assessment and plan as outlined by Paula Guenther, NP. ° °Daron Breeding, DO, FACG ° °

## 2022-06-14 NOTE — Patient Instructions (Signed)
Medication Instructions:  The current medical regimen is effective;  continue present plan and medications as directed. Please refer to the Current Medication list given to you today.  *If you need a refill on your cardiac medications before your next appointment, please call your pharmacy*  Lab Work: NONE If you have labs (blood work) drawn today and your tests are completely normal, you will receive your results only by:  MyChart Message (if you have MyChart) OR A paper copy in the mail If you have any lab test that is abnormal or we need to change your treatment, we will call you to review the results.  Testing/Procedures: none   Follow-Up: At [facility], you and your health needs are our priority.  As part of our continuing mission to provide you with exceptional heart care, we have created designated Provider Care Teams.  These Care Teams include your primary Cardiologist (physician) and Advanced Practice Providers (APPs -  Physician Assistants and Nurse Practitioners) who all work together to provide you with the care you need, when you need it.  We recommend signing up for the patient portal called "MyChart".  Sign up information is provided on this After Visit Summary.  MyChart is used to connect with patients for Virtual Visits (Telemedicine).  Patients are able to view lab/test results, encounter notes, upcoming appointments, etc.  Non-urgent messages can be sent to your provider as well.   To learn more about what you can do with MyChart, go to [email or web address].    Your next appointment:   6 month(s)  Provider:   Nicki Guadalajara, MD     Other Instructions  PLEASE READ AND FOLLOW ATTACHED  SALTY 6

## 2022-06-17 NOTE — Telephone Encounter (Signed)
Patient is aware that procedure for 06-24-22 with Dr Barron Alvine has been cancelled.  Patient aware that he has been scheduled for an office visit on 10-31-22 at 1040am with Dr Barron Alvine to further discuss procedure.  Patient will need clearance to hold Brilinta from Dr Tresa Endo with [facility].  Patient agreed to plan and verbalized understanding.  No further questions.

## 2022-06-24 ENCOUNTER — Encounter: Payer: Medicaid Other | Admitting: Gastroenterology

## 2022-06-28 ENCOUNTER — Other Ambulatory Visit: Payer: Self-pay

## 2022-07-05 ENCOUNTER — Encounter: Payer: Medicaid Other | Admitting: Internal Medicine

## 2022-07-25 ENCOUNTER — Other Ambulatory Visit: Payer: Self-pay

## 2022-07-25 DIAGNOSIS — R7989 Other specified abnormal findings of blood chemistry: Secondary | ICD-10-CM

## 2022-08-13 ENCOUNTER — Other Ambulatory Visit: Payer: Self-pay | Admitting: Physician Assistant

## 2022-08-13 ENCOUNTER — Other Ambulatory Visit: Payer: Self-pay

## 2022-08-13 DIAGNOSIS — E785 Hyperlipidemia, unspecified: Secondary | ICD-10-CM

## 2022-08-13 MED ORDER — ROSUVASTATIN CALCIUM 40 MG PO TABS
40.00 mg | ORAL_TABLET | Freq: Every day | ORAL | 3 refills | Status: AC
Start: 2022-08-13 — End: ?
  Filled 2022-08-13: qty 90, 90d supply, fill #0
  Filled 2023-02-28: qty 90, 90d supply, fill #1
  Filled 2023-06-17 – 2023-07-03 (×2): qty 90, 90d supply, fill #2

## 2022-08-14 ENCOUNTER — Other Ambulatory Visit: Payer: Medicaid Other

## 2022-08-14 DIAGNOSIS — R7989 Other specified abnormal findings of blood chemistry: Secondary | ICD-10-CM

## 2022-08-20 ENCOUNTER — Ambulatory Visit (HOSPITAL_COMMUNITY)
Admission: RE | Admit: 2022-08-20 | Discharge: 2022-08-20 | Disposition: A | Discharge status: Home / Self Care | Payer: Medicaid Other | Source: Ambulatory Visit | Attending: Nurse Practitioner | Admitting: Nurse Practitioner

## 2022-08-20 ENCOUNTER — Other Ambulatory Visit: Payer: Self-pay | Admitting: Nurse Practitioner

## 2022-08-20 DIAGNOSIS — R7989 Other specified abnormal findings of blood chemistry: Secondary | ICD-10-CM | POA: Diagnosis present

## 2022-08-20 MED ORDER — GADOBUTROL 1 MMOL/ML IV SOLN
10.0000 mL | Freq: Once | INTRAVENOUS | Status: AC | PRN
  Administered 2022-08-20: 10 mL via INTRAVENOUS

## 2022-08-22 LAB — ANTI-MICROSOMAL ANTIBODY LIVER / KIDNEY: LKM1 Ab: <=20 U (ref <=20.0)

## 2022-08-22 LAB — IGA: Immunoglobulin A: 500 mg/dL — ABNORMAL HIGH (ref 47–310)

## 2022-08-22 LAB — IGG: IgG (Immunoglobin G), Serum: 1579 mg/dL (ref 600–1640)

## 2022-08-30 ENCOUNTER — Telehealth: Payer: Self-pay | Admitting: Nurse Practitioner

## 2022-08-30 NOTE — Telephone Encounter (Signed)
Pt is in lobby and he's here to pick up her form

## 2022-09-16 ENCOUNTER — Other Ambulatory Visit: Payer: Self-pay | Admitting: Nurse Practitioner

## 2022-09-16 DIAGNOSIS — R195 Other fecal abnormalities: Secondary | ICD-10-CM

## 2022-09-16 NOTE — Telephone Encounter (Signed)
Pt is asking for a letter from you to let [employer]) know that he is not actively working due to his health issues.

## 2022-09-18 NOTE — Telephone Encounter (Signed)
Letter was sent in pt my chart and mailed. KH

## 2022-09-23 ENCOUNTER — Other Ambulatory Visit: Payer: Self-pay

## 2022-09-23 ENCOUNTER — Encounter: Payer: Self-pay | Admitting: Nurse Practitioner

## 2022-09-23 ENCOUNTER — Ambulatory Visit (INDEPENDENT_AMBULATORY_CARE_PROVIDER_SITE_OTHER): Payer: Medicaid Other | Admitting: Nurse Practitioner

## 2022-09-23 VITALS — BP 138/79 | HR 79 | Temp 97.0°F | Wt 268.0 lb

## 2022-09-23 DIAGNOSIS — D649 Anemia, unspecified: Secondary | ICD-10-CM | POA: Diagnosis not present

## 2022-09-23 MED ORDER — NITROGLYCERIN 0.4 MG SL SUBL
0.4000 mg | SUBLINGUAL_TABLET | SUBLINGUAL | 3 refills | Status: AC | PRN
  Filled 2022-09-23: qty 25, 8d supply, fill #0
  Filled 2023-03-28: qty 25, 8d supply, fill #1

## 2022-09-23 NOTE — Assessment & Plan Note (Signed)
-   CBC - Comprehensive metabolic panel - Iron, TIBC and Ferritin Panel   Follow up:  Follow up in 6 months

## 2022-09-23 NOTE — Progress Notes (Signed)
@Patient  ID: Gene Reed, male    DOB: 1970/10/19, 52 y.o.   MRN: [id number]  Chief Complaint  Patient presents with   low iron    Referring provider: Ivonne Andrew, NP   HPI  Patient presents today for follow-up on hypertension and hyperlipidemia.  He will need labs today.  Patient states that he continues to stay out of work after syncopal episode last year.  He is followed by cardiology.  Patient has been doing well with no syncopal episodes this year.  Hemoglobin was low at last check.  We will recheck labs today.  Denies f/c/s, n/v/d, hemoptysis, PND, leg swelling. Denies chest pain or edema.     Allergies  Allergen Reactions   Poison Ivy Extract Itching    Poison oak    Immunization History  Administered Date(s) Administered   PFIZER(Purple Top)SARS-COV-2 Vaccination 06/22/2019, 07/25/2019   Tdap 09/21/2015    Past Medical History:  Diagnosis Date   Adrenal nodule (HCC)    Arthritis    Bilateral chronic knee pain    Bone spur    CAD (coronary artery disease)    Gout    Hyperlipidemia    Hypertension     Tobacco History: Social History   Tobacco Use  Smoking Status Former  Smokeless Tobacco Never   Counseling given: Not Answered   Outpatient Encounter Medications as of 09/23/2022  Medication Sig   allopurinol (ZYLOPRIM) 100 MG tablet Take 1 tablet (100 mg total) by mouth daily.   amLODipine (NORVASC) 5 MG tablet Take 1 tablet (5 mg total) by mouth daily.   carvedilol (COREG) 25 MG tablet Take 1 tablet (25 mg total) by mouth 2 (two) times daily.   omega-3 acid ethyl esters (LOVAZA) 1 g capsule Take 1 capsule (1 g total) by mouth daily.   omeprazole (PRILOSEC) 40 MG capsule Take 1 capsule (40 mg total) by mouth daily as needed   rosuvastatin (CRESTOR) 40 MG tablet Take 1 tablet (40 mg total) by mouth daily.   ticagrelor (BRILINTA) 90 MG TABS tablet Take 1 tablet (90 mg total) by mouth 2 (two) times daily.   acetaminophen-codeine (TYLENOL #3)  300-30 MG tablet Take 1 tablet by mouth every 6 (six) hours. (Patient not taking: Reported on 09/23/2022)   Na Sulfate-K Sulfate-Mg Sulf (SUPREP BOWEL PREP KIT) 17.5-3.13-1.6 GM/177ML SOLN Take 1 kit by mouth as directed. (Patient not taking: Reported on 06/14/2022)   nitroGLYCERIN (NITROSTAT) 0.4 MG SL tablet Place 1 tablet (0.4 mg total) under the tongue every 5 (five) minutes as needed for chest pain.   [DISCONTINUED] nitroGLYCERIN (NITROSTAT) 0.4 MG SL tablet Place 1 tablet (0.4 mg total) under the tongue every 5 (five) minutes as needed for chest pain. (Patient not taking: Reported on 03/01/2022)   No facility-administered encounter medications on file as of 09/23/2022.     Review of Systems  Review of Systems  Constitutional: Negative.   HENT: Negative.    Cardiovascular: Negative.   Gastrointestinal: Negative.   Allergic/Immunologic: Negative.   Neurological: Negative.   Psychiatric/Behavioral: Negative.         Physical Exam  BP 138/79   Pulse 79   Temp (!) 97 F (36.1 C)   Wt 268 lb (121.6 kg)   SpO2 100%   BMI 39.58 kg/m   Wt Readings from Last 5 Encounters:  09/23/22 268 lb (121.6 kg)  06/14/22 275 lb 3.2 oz (124.8 kg)  06/05/22 275 lb (124.7 kg)  05/31/22 273 lb 6.4 oz (  124 kg)  04/03/22 280 lb 3.2 oz (127.1 kg)     Physical Exam Vitals and nursing note reviewed.  Constitutional:      General: He is not in acute distress.    Appearance: He is well-developed.  Cardiovascular:     Rate and Rhythm: Normal rate and regular rhythm.  Pulmonary:     Effort: Pulmonary effort is normal.     Breath sounds: Normal breath sounds.  Skin:    General: Skin is warm and dry.  Neurological:     Mental Status: He is alert and oriented to person, place, and time.      Lab Results:  CBC    Component Value Date/Time   WBC 8.9 05/31/2022 1145   WBC 10.2 03/19/2022 1214   RBC 4.11 (L) 05/31/2022 1145   RBC 4.57 03/19/2022 1214   HGB 12.8 (L) 05/31/2022 1145    HCT 37.7 05/31/2022 1145   PLT 210 05/31/2022 1145   MCV 92 05/31/2022 1145   MCH 31.1 05/31/2022 1145   MCH 25.6 (L) 01/27/2022 2000   MCHC 34.0 05/31/2022 1145   MCHC 34.6 03/19/2022 1214   RDW 12.8 05/31/2022 1145   LYMPHSABS 2.8 02/05/2022 1016   LYMPHSABS 3.0 08/13/2019 0936   MONOABS 0.7 02/05/2022 1016   EOSABS 0.2 02/05/2022 1016   EOSABS 0.3 08/13/2019 0936   BASOSABS 0.1 02/05/2022 1016   BASOSABS 0.1 08/13/2019 0936    BMET    Component Value Date/Time   NA 142 05/31/2022 1145   K 3.9 05/31/2022 1145   CL 108 (H) 05/31/2022 1145   CO2 18 (L) 05/31/2022 1145   GLUCOSE 133 (H) 05/31/2022 1145   GLUCOSE 104 (H) 01/27/2022 2000   BUN 9 05/31/2022 1145   CREATININE 0.61 (L) 05/31/2022 1145   CREATININE 0.66 11/20/2016 1051   CALCIUM 8.9 05/31/2022 1145   GFRNONAA >60 01/27/2022 2000   GFRNONAA 117 11/20/2016 1051   GFRAA 133 11/12/2019 0925   GFRAA 135 11/20/2016 1051      Assessment & Plan:   Anemia - CBC - Comprehensive metabolic panel - Iron, TIBC and Ferritin Panel   Follow up:  Follow up in 6 months     Ivonne Andrew, NP 09/23/2022

## 2022-09-23 NOTE — Patient Instructions (Addendum)
1. Anemia, unspecified type  - CBC - Comprehensive metabolic panel - Iron, TIBC and Ferritin Panel   Follow up:  Follow up in 6 months

## 2022-09-24 LAB — IRON,TIBC AND FERRITIN PANEL
Ferritin: 246 ng/mL (ref 30–400)
Iron Saturation: 24 % (ref 15–55)
Iron: 84 ug/dL (ref 38–169)
Total Iron Binding Capacity: 344 ug/dL (ref 250–450)
UIBC: 260 ug/dL (ref 111–343)

## 2022-09-24 LAB — COMPREHENSIVE METABOLIC PANEL
ALT: 40 IU/L (ref 0–44)
AST: 67 IU/L — ABNORMAL HIGH (ref 0–40)
Albumin: 4.5 g/dL (ref 3.8–4.9)
Alkaline Phosphatase: 144 IU/L — ABNORMAL HIGH (ref 44–121)
BUN/Creatinine Ratio: 9 (ref 9–20)
BUN: 7 mg/dL (ref 6–24)
Bilirubin Total: 1 mg/dL (ref 0.0–1.2)
CO2: 23 mmol/L (ref 20–29)
Calcium: 9 mg/dL (ref 8.7–10.2)
Chloride: 103 mmol/L (ref 96–106)
Creatinine, Ser: 0.79 mg/dL (ref 0.76–1.27)
Globulin, Total: 3.5 g/dL (ref 1.5–4.5)
Glucose: 97 mg/dL (ref 70–99)
Potassium: 4 mmol/L (ref 3.5–5.2)
Sodium: 139 mmol/L (ref 134–144)
Total Protein: 8 g/dL (ref 6.0–8.5)
eGFR: 108 mL/min/{1.73_m2} (ref >59)

## 2022-09-24 LAB — CBC
Hematocrit: 39.1 % (ref 37.5–51.0)
Hemoglobin: 13.3 g/dL (ref 13.0–17.7)
MCH: 30.2 pg (ref 26.6–33.0)
MCHC: 34 g/dL (ref 31.5–35.7)
MCV: 89 fL (ref 79–97)
Platelets: 249 10*3/uL (ref 150–450)
RBC: 4.4 x10E6/uL (ref 4.14–5.80)
RDW: 12.8 % (ref 11.6–15.4)
WBC: 9.7 10*3/uL (ref 3.4–10.8)

## 2022-09-26 ENCOUNTER — Other Ambulatory Visit: Payer: Self-pay

## 2022-09-26 ENCOUNTER — Other Ambulatory Visit: Payer: Self-pay | Admitting: Nurse Practitioner

## 2022-09-26 MED ORDER — OMEGA-3-ACID ETHYL ESTERS 1 G PO CAPS
1.0000 g | ORAL_CAPSULE | Freq: Every day | ORAL | 2 refills | Status: AC
  Filled 2022-09-26: qty 30, 30d supply, fill #0

## 2022-10-03 ENCOUNTER — Other Ambulatory Visit: Payer: Self-pay

## 2022-10-21 ENCOUNTER — Other Ambulatory Visit: Payer: Self-pay

## 2022-10-31 ENCOUNTER — Telehealth: Payer: Self-pay

## 2022-10-31 ENCOUNTER — Other Ambulatory Visit: Payer: Self-pay

## 2022-10-31 ENCOUNTER — Other Ambulatory Visit (INDEPENDENT_AMBULATORY_CARE_PROVIDER_SITE_OTHER): Payer: Medicaid Other

## 2022-10-31 ENCOUNTER — Ambulatory Visit: Payer: Medicaid Other | Admitting: Gastroenterology

## 2022-10-31 ENCOUNTER — Encounter: Payer: Self-pay | Admitting: Gastroenterology

## 2022-10-31 VITALS — BP 132/80 | HR 89 | Ht 69.0 in | Wt 191.0 lb

## 2022-10-31 DIAGNOSIS — K760 Fatty (change of) liver, not elsewhere classified: Secondary | ICD-10-CM

## 2022-10-31 DIAGNOSIS — R7401 Elevation of levels of liver transaminase levels: Secondary | ICD-10-CM | POA: Diagnosis not present

## 2022-10-31 DIAGNOSIS — Z79899 Other long term (current) drug therapy: Secondary | ICD-10-CM

## 2022-10-31 DIAGNOSIS — R195 Other fecal abnormalities: Secondary | ICD-10-CM | POA: Diagnosis not present

## 2022-10-31 DIAGNOSIS — I25119 Atherosclerotic heart disease of native coronary artery with unspecified angina pectoris: Secondary | ICD-10-CM

## 2022-10-31 DIAGNOSIS — Z1211 Encounter for screening for malignant neoplasm of colon: Secondary | ICD-10-CM

## 2022-10-31 DIAGNOSIS — K219 Gastro-esophageal reflux disease without esophagitis: Secondary | ICD-10-CM

## 2022-10-31 DIAGNOSIS — K5520 Angiodysplasia of colon without hemorrhage: Secondary | ICD-10-CM

## 2022-10-31 DIAGNOSIS — R748 Abnormal levels of other serum enzymes: Secondary | ICD-10-CM

## 2022-10-31 LAB — PROTIME-INR
INR: 1.3 ratio — ABNORMAL HIGH (ref 0.8–1.0)
Prothrombin Time: 13.2 s — ABNORMAL HIGH (ref 9.6–13.1)

## 2022-10-31 MED ORDER — NA SULFATE-K SULFATE-MG SULF 17.5-3.13-1.6 GM/177ML PO SOLN
ORAL | 0 refills | Status: DC
  Filled 2022-10-31 – 2022-12-11 (×2): qty 354, 1d supply, fill #0

## 2022-10-31 NOTE — Telephone Encounter (Signed)
Name: Gene Reed  DOB: 04/27/1970  MRN: [id number]   Primary Cardiologist: Nicki Guadalajara, MD  Chart reviewed as part of pre-operative protocol coverage. Patient was contacted 10/31/2022 in reference to pre-operative risk assessment for pending surgery as outlined below.  Gene Reed was last seen on 06/14/2022 by Edd Fabian, NP.  Since that day, Gene Reed has done well without exertional chest pain or worsening dyspnea.  He his last PCI was on 12/06/2021, anytime after 12/07/2022, he can start holding Brilinta if needed for GI procedure.  We recommend he hold Brilinta for 5 days prior to his upcoming colonoscopy and restart as soon as possible afterward at his GI doctor's discretion.  Therefore, based on ACC/AHA guidelines, the patient would be at acceptable risk for the planned procedure without further cardiovascular testing.   The patient was advised that if he develops new symptoms prior to surgery to contact our office to arrange for a follow-up visit, and he verbalized understanding.  I will route this recommendation to the requesting party via Epic fax function and remove from pre-op pool. Please call with questions.  [place], [place] 10/31/2022, 12:45 PM

## 2022-10-31 NOTE — Progress Notes (Signed)
Chief Complaint:    Cologuard+, discuss colonoscopy  GI History: 52 year old male with a history of HTN, CAD  s/p DES 12/2021 (on Brilinta), OSA on CPAP, gout, small bowel AVM bleed, follows in GI clinic for the following:  1) Elevated liver enzymes.  Mildly elevated AST and alk phos - 04/15/2022: RUQ [place]: Hepatic steatosis, otherwise normal.  No duct dilation - 05/31/2022: AST/ALT 58/45, ALP 159 - IgA 500 - Elevated ASMA at 60 - GGT 936 - Negative/normal IgG, LKM, ceruloplasmin, ANA, AMA, A1 AT, TTG, iron panel - 08/23/2022: MRI/MRCP severe hepatomegaly at 27.6 cm with hepatic steatosis, but no duct dilation.  Normal GB, pancreas, spleen.  Left adrenal adenoma.  Normal visualized GI tract - 09/23/2022: AST/ALT 67/40, ALP 144  2) GERD.  Well-controlled with omeprazole   Endoscopic History: - 01/23/2022: EGD (indication: Melena): Normal esophagus and duodenum.  AVM in first portion of duodenum treated with APC.  Otherwise normal to fourth portion of the duodenum  HPI:     Patient is a 52 y.o. male presenting to the Gastroenterology Clinic for follow-up.  Was last seen by Willette Cluster on 06/05/2022 for evaluation of mildly elevated alk phos and AST.  Had recommended colonoscopy for routine CRC screening when cleared by his Cardiologist.  Case reviewed by his Cardiologist, Dr. Tresa Endo, who recommended waiting until 12/2021 when Brilinta could be hold safely for 5 days.  Since that time, he had a Cologuard on 09/10/2022 that was positive.  Normal CBC. He would like to discuss colonoscopy today.   Separately, CMP from 09/23/2022 reviewed and with persistent mild elevation of ALP (144) and AST (67) with otherwise normal T. bili, ALT, BMP.  Drinks 12 beers/day routinely. Has cut down since the liver labs to 6/day.        Latest Ref Rng & Units 09/23/2022    4:11 PM 05/31/2022   11:45 AM 04/03/2022    2:01 PM  CBC  WBC 3.4 - 10.8 x10E3/uL 9.7  8.9  10.1   Hemoglobin 13.0 - 17.7 g/dL 16.1  09.6   04.5   Hematocrit 37.5 - 51.0 % 39.1  37.7  39.9   Platelets 150 - 450 x10E3/uL 249  210  215       Latest Ref Rng & Units 09/23/2022    4:11 PM 06/05/2022    9:52 AM 05/31/2022   11:45 AM  CMP  Glucose 70 - 99 mg/dL 97   409   BUN 6 - 24 mg/dL 7   9   Creatinine 8.11 - 1.27 mg/dL 9.14   7.82   Sodium 956 - 144 mmol/L 139   142   Potassium 3.5 - 5.2 mmol/L 4.0   3.9   Chloride 96 - 106 mmol/L 103   108   CO2 20 - 29 mmol/L 23   18   Calcium 8.7 - 10.2 mg/dL 9.0   8.9   Total Protein 6.0 - 8.5 g/dL 8.0  8.1  7.7   Total Bilirubin 0.0 - 1.2 mg/dL 1.0  0.7  0.5   Alkaline Phos 44 - 121 IU/L 144  125  159   AST 0 - 40 IU/L 67  87  58   ALT 0 - 44 IU/L 40  57  45      Review of systems:     No chest pain, no SOB, no fevers, no urinary sx   Past Medical History:  Diagnosis Date   Adrenal nodule (HCC)    Arthritis  Bilateral chronic knee pain    Bone spur    CAD (coronary artery disease)    Gout    Hyperlipidemia    Hypertension     Patient's surgical history, family medical history, social history, medications and allergies were all reviewed in Epic    Current Outpatient Medications  Medication Sig Dispense Refill   allopurinol (ZYLOPRIM) 100 MG tablet Take 1 tablet (100 mg total) by mouth daily. 90 tablet 3   amLODipine (NORVASC) 5 MG tablet Take 1 tablet (5 mg total) by mouth daily. 90 tablet 1   Na Sulfate-K Sulfate-Mg Sulf (SUPREP BOWEL PREP KIT) 17.5-3.13-1.6 GM/177ML SOLN Take 1 kit by mouth as directed. 344 mL 0   omeprazole (PRILOSEC) 40 MG capsule Take 1 capsule (40 mg total) by mouth daily as needed 90 capsule 3   rosuvastatin (CRESTOR) 40 MG tablet Take 1 tablet (40 mg total) by mouth daily. 90 tablet 3   ticagrelor (BRILINTA) 90 MG TABS tablet Take 1 tablet (90 mg total) by mouth 2 (two) times daily. 60 tablet 11   acetaminophen-codeine (TYLENOL #3) 300-30 MG tablet Take 1 tablet by mouth every 6 (six) hours. (Patient not taking: Reported on 10/31/2022) 12 tablet  0   carvedilol (COREG) 25 MG tablet Take 1 tablet (25 mg total) by mouth 2 (two) times daily. (Patient not taking: Reported on 10/31/2022) 180 tablet 3   nitroGLYCERIN (NITROSTAT) 0.4 MG SL tablet Place 1 tablet (0.4 mg total) under the tongue every 5 (five) minutes as needed for chest pain. (Patient not taking: Reported on 10/31/2022) 25 tablet 3   omega-3 acid ethyl esters (LOVAZA) 1 g capsule Take 1 capsule (1 g total) by mouth daily. (Patient not taking: Reported on 10/31/2022) 30 capsule 2   No current facility-administered medications for this visit.    Physical Exam:     BP 132/80   Pulse 89   Ht 5\' 9"  (1.753 m)   Wt 191 lb (86.6 kg)   BMI 28.21 kg/m   GENERAL:  Pleasant male in NAD PSYCH: : Cooperative, normal affect Musculoskeletal:  Normal muscle tone, normal strength NEURO: Alert and oriented x 3, no focal neurologic deficits   IMPRESSION and PLAN:    1) Cologuard+ Requires colonoscopy for evaluation of + Cologuard.  Otherwise no overt bleeding. - Colonoscopy to be scheduled in November to allow 12 months of Brilinta after DES  2) Elevated alkaline phosphatase 3) Elevated AST > ALT 4) EtOH use 5) Hepatic steatosis Suspect EtOH induced hepatic steatosis.  Discussed full DDx today with plan for the following: - Alkaline phosphatase isoenzyme fractionization, 5' nucleotidase - Check PT/INR - Ultrasound elastography - Counseled extensively on EtOH cessation - Briefly discussed the role/utility of liver biopsy  6) CAD s/p DES 7) antiplatelet therapy - Cardiology clearance to proceed with colonoscopy and for clearance to hold Brilinta x5 days prior to colonoscopy  8) Small bowel AVM - Anemia resolved, normal iron indices.  No recurrence of bleeding  9) GERD - Well-controlled on current therapy - Continue Prilosec - Continue antireflux lifestyle/dietary modification  The indications, risks, and benefits of colonoscopy were explained to the patient in detail.  Risks include but are not limited to bleeding, perforation, adverse reaction to medications, and cardiopulmonary compromise. Sequelae include but are not limited to the possibility of surgery, hospitalization, and mortality. The patient verbalized understanding and wished to proceed. All questions answered, referred to the scheduler and bowel prep ordered. Further recommendations pending results of the exam.  I spent 40 minutes of time, including in depth chart review, independent review of results as outlined above, communicating results with the patient directly, face-to-face time with the patient, coordinating care, ordering studies and medications as appropriate, and documentation.            Verlin Dike Nyaisha Simao ,DO, FACG 10/31/2022, 11:02 AM

## 2022-10-31 NOTE — Telephone Encounter (Signed)
[facility] Pre-operative Risk Assessment     Request for surgical clearance:     Endoscopy Procedure  What type of surgery is being performed?     Colonoscopy  When is this surgery scheduled?     12/18/22  What type of clearance is required ?   Pharmacy  Are there any medications that need to be held prior to surgery and how long? Brilinta 5 day's hold  Practice name and name of physician performing surgery?      [facility]  What is your office phone and fax number?      Phone- [phone]  Fax- [phone]  Anesthesia type (None, local, MAC, general) ?       MAC

## 2022-10-31 NOTE — Patient Instructions (Addendum)
Your provider has requested that you go to the basement level for lab work before leaving today. Press "B" on the elevator. The lab is located at the first door on the left as you exit the elevator.    You have been scheduled for an abdominal ultrasound with elastography at [facility] (1st floor). Your appointment is scheduled for 11/04/22 at 10:30am. Please arrive 30 minutes prior to your scheduled appointment for registration purposes. Make certain not to have anything to eat or drink 6 hours prior to your procedure. Should you need to reschedule your appointment, you may contact radiology at [phone].  Liver Elastography Various chronic liver diseases such as hepatitis B, C, and fatty liver disease can lead to tissue damage and subsequent scar tissue formation. As the scar tissue accumulates, the liver loses some of its elasticity and becomes stiffer. Liver elastography involves the use of a surface ultrasound probe that delivers a low frequency pulse or shear wave to a small volume of liver tissue under the rib cage. The transmission of the sound wave is completely painless. How Is a Liver Elastography Performed? The liver is located in the right upper abdomen under the rib cage. Patients are asked to lie flat on an examination table. A technician places the FibroScan probe between the ribs on the right side of the lower chest wall. A series of 10 painless pulses are then applied to the liver. The results are recorded on the equipment and an overall liver stiffness score is generated. This score is then interpreted by a qualified physician to predict the likelihood of advanced fibrosis or cirrhosis.  Patients are asked to wear loose clothing and should not consume any liquids or solids for a minimum of 4 hours before the test to increase the likelihood of obtaining reliable test results. The scan will take 10 to 15 minutes to complete, but patients should plan on being available  for 30 minutes to allow time for preparation   You have been scheduled for a colonoscopy. Please follow written instructions given to you at your visit today.   Please pick up your prep supplies at the pharmacy within the next 1-3 days.  If you use inhalers (even only as needed), please bring them with you on the day of your procedure.  DO NOT TAKE 7 DAYS PRIOR TO TEST- Trulicity (dulaglutide) Ozempic, Wegovy (semaglutide) Mounjaro (tirzepatide) Bydureon Bcise (exanatide extended release)  DO NOT TAKE 1 DAY PRIOR TO YOUR TEST Rybelsus (semaglutide) Adlyxin (lixisenatide) Victoza (liraglutide) Byetta (exanatide) ___________________________________________________________________________  We have sent the following medications to your pharmacy for you to pick up at your convenience:  Suprep  _______________________________________________________  If your blood pressure at your visit was 140/90 or greater, please contact your primary care physician to follow up on this.  _______________________________________________________  If you are age 51 or older, your body mass index should be between 23-30. Your Body mass index is 28.21 kg/m. If this is out of the aforementioned range listed, please consider follow up with your Primary Care Provider.  If you are age 67 or younger, your body mass index should be between 19-25. Your Body mass index is 28.21 kg/m. If this is out of the aformentioned range listed, please consider follow up with your Primary Care Provider.   ________________________________________________________  The [facility] providers would like to encourage you to use [facility] to communicate with providers for non-urgent requests or questions.  Due to long hold times on the telephone, sending your  provider a message by [facility] may be a faster and more efficient way to get a response.  Please allow 48 business hours for a response.  Please remember that this is for  non-urgent requests.  _______________________________________________________  Due to recent changes in healthcare laws, you may see the results of your imaging and laboratory studies on MyChart before your provider has had a chance to review them.  We understand that in some cases there may be results that are confusing or concerning to you. Not all laboratory results come back in the same time frame and the provider may be waiting for multiple results in order to interpret others.  Please give [place] 48 hours in order for your provider to thoroughly review all the results before contacting the office for clarification of your results.   Thank you for entrusting me with your care and choosing [facility].  Dr Shellia Cleverly

## 2022-11-04 ENCOUNTER — Telehealth: Payer: Self-pay

## 2022-11-04 ENCOUNTER — Ambulatory Visit (HOSPITAL_COMMUNITY)
Admission: RE | Admit: 2022-11-04 | Discharge: 2022-11-04 | Disposition: A | Discharge status: Home / Self Care | Payer: Medicaid Other | Source: Ambulatory Visit | Attending: Gastroenterology | Admitting: Gastroenterology

## 2022-11-04 ENCOUNTER — Other Ambulatory Visit: Payer: Self-pay

## 2022-11-04 DIAGNOSIS — R195 Other fecal abnormalities: Secondary | ICD-10-CM | POA: Diagnosis present

## 2022-11-04 DIAGNOSIS — Z1211 Encounter for screening for malignant neoplasm of colon: Secondary | ICD-10-CM | POA: Insufficient documentation

## 2022-11-04 DIAGNOSIS — R7401 Elevation of levels of liver transaminase levels: Secondary | ICD-10-CM | POA: Insufficient documentation

## 2022-11-04 DIAGNOSIS — R748 Abnormal levels of other serum enzymes: Secondary | ICD-10-CM | POA: Diagnosis present

## 2022-11-04 LAB — ALKALINE PHOSPHATASE, ISOENZYMES
Alkaline Phosphatase: 125 [IU]/L — ABNORMAL HIGH (ref 44–121)
BONE FRACTION: 29 % (ref 12–68)
INTESTINAL FRAC.: 5 % (ref 0–18)
LIVER FRACTION: 66 % (ref 13–88)

## 2022-11-04 NOTE — Telephone Encounter (Signed)
Spoke to patient advise received fax from cardiology okay to hold Brilinta 5 day's prior to procedure. Patient verbalized understanding

## 2022-11-06 LAB — NUCLEOTIDASE, 5', BLOOD: 5-Nucleotidase: 6 U/L (ref 0–10)

## 2022-11-07 ENCOUNTER — Other Ambulatory Visit: Payer: Self-pay

## 2022-12-02 ENCOUNTER — Ambulatory Visit: Payer: Self-pay | Admitting: Nurse Practitioner

## 2022-12-11 ENCOUNTER — Other Ambulatory Visit: Payer: Self-pay

## 2022-12-18 ENCOUNTER — Encounter: Payer: Self-pay | Admitting: Gastroenterology

## 2022-12-18 ENCOUNTER — Ambulatory Visit: Payer: Medicaid Other | Admitting: Gastroenterology

## 2022-12-18 VITALS — BP 142/92 | HR 84 | Temp 98.1°F | Resp 16 | Ht 68.0 in | Wt 269.0 lb

## 2022-12-18 DIAGNOSIS — D122 Benign neoplasm of ascending colon: Secondary | ICD-10-CM

## 2022-12-18 DIAGNOSIS — K641 Second degree hemorrhoids: Secondary | ICD-10-CM

## 2022-12-18 DIAGNOSIS — Z1211 Encounter for screening for malignant neoplasm of colon: Secondary | ICD-10-CM

## 2022-12-18 DIAGNOSIS — D125 Benign neoplasm of sigmoid colon: Secondary | ICD-10-CM

## 2022-12-18 DIAGNOSIS — K5730 Diverticulosis of large intestine without perforation or abscess without bleeding: Secondary | ICD-10-CM

## 2022-12-18 DIAGNOSIS — R195 Other fecal abnormalities: Secondary | ICD-10-CM

## 2022-12-18 MED ORDER — SODIUM CHLORIDE 0.9 % IV SOLN: 500.0000 mL | Freq: Once | INTRAVENOUS | Status: DC

## 2022-12-18 NOTE — Progress Notes (Signed)
Called to room to assist during endoscopic procedure.  Patient ID and intended procedure confirmed with present staff. Received instructions for my participation in the procedure from the performing physician.  

## 2022-12-18 NOTE — Progress Notes (Signed)
GASTROENTEROLOGY PROCEDURE H&P NOTE   Primary Care Physician: Ivonne Andrew, NP    Reason for Procedure:  Positive Cologuard  Plan:    Colonoscopy  Patient is appropriate for endoscopic procedure(s) in the ambulatory ([facility]) setting.  The nature of the procedure, as well as the risks, benefits, and alternatives were carefully and thoroughly reviewed with the patient. Ample time for discussion and questions allowed. The patient understood, was satisfied, and agreed to proceed.     HPI: Gene Reed is a 52 y.o. male who presents for colonoscopy for evaluation of positive Cologuard.  No recent overt bleeding.  Has been holding his Brilinta in anticipation of procedure today.  Past Medical History:  Diagnosis Date   Adrenal nodule (HCC)    Arthritis    Bilateral chronic knee pain    Blood transfusion without reported diagnosis    Bone spur    CAD (coronary artery disease)    GERD (gastroesophageal reflux disease)    Gout    Hyperlipidemia    Hypertension    Sleep apnea    wears CPAP   Substance abuse (HCC)     Past Surgical History:  Procedure Laterality Date   CORONARY LITHOTRIPSY N/A 12/06/2021   Procedure: CORONARY LITHOTRIPSY;  Surgeon: Lennette Bihari, MD;  Location: [facility] INVASIVE CV LAB;  Service: Cardiovascular;  Laterality: N/A;   CORONARY STENT INTERVENTION Right 12/06/2021   Procedure: CORONARY STENT INTERVENTION;  Surgeon: Lennette Bihari, MD;  Location: [facility] INVASIVE CV LAB;  Service: Cardiovascular;  Laterality: Right;   ESOPHAGOGASTRODUODENOSCOPY Left 01/23/2022   Procedure: ESOPHAGOGASTRODUODENOSCOPY (EGD);  Surgeon: Shellia Cleverly, DO;  Location: [facility] ENDOSCOPY;  Service: Gastroenterology;  Laterality: Left;   HOT HEMOSTASIS N/A 01/23/2022   Procedure: HOT HEMOSTASIS (ARGON PLASMA COAGULATION/BICAP);  Surgeon: Shellia Cleverly, DO;  Location: [facility] ENDOSCOPY;  Service: Gastroenterology;  Laterality: N/A;   KNEE SURGERY     LEFT HEART CATH AND  CORONARY ANGIOGRAPHY N/A 12/06/2021   Procedure: LEFT HEART CATH AND CORONARY ANGIOGRAPHY;  Surgeon: Lennette Bihari, MD;  Location: [facility] INVASIVE CV LAB;  Service: Cardiovascular;  Laterality: N/A;   UPPER GASTROINTESTINAL ENDOSCOPY      Prior to Admission medications   Medication Sig Start Date End Date Taking? Authorizing Provider  allopurinol (ZYLOPRIM) 100 MG tablet Take 1 tablet (100 mg total) by mouth daily. Patient not taking: Reported on 12/18/2022 03/01/22   Ivonne Andrew, NP  amLODipine (NORVASC) 5 MG tablet Take 1 tablet (5 mg total) by mouth daily. 05/31/22   Ivonne Andrew, NP  carvedilol (COREG) 25 MG tablet Take 1 tablet (25 mg total) by mouth 2 (two) times daily. Patient not taking: Reported on 10/31/2022 03/06/22   Azalee Course, PA  nitroGLYCERIN (NITROSTAT) 0.4 MG SL tablet Place 1 tablet (0.4 mg total) under the tongue every 5 (five) minutes as needed for chest pain. Patient not taking: Reported on 10/31/2022 09/23/22 12/22/22  Ivonne Andrew, NP  omega-3 acid ethyl esters (LOVAZA) 1 g capsule Take 1 capsule (1 g total) by mouth daily. Patient not taking: Reported on 10/31/2022 09/26/22   Ivonne Andrew, NP  omeprazole (PRILOSEC) 40 MG capsule Take 1 capsule (40 mg total) by mouth daily as needed Patient taking differently: Take 40 mg by mouth daily as needed. Takes PRN 04/03/22   Meredith Pel, NP  rosuvastatin (CRESTOR) 40 MG tablet Take 1 tablet (40 mg total) by mouth daily. 08/13/22   Lennette Bihari, MD  ticagrelor (  BRILINTA) 90 MG TABS tablet Take 1 tablet (90 mg total) by mouth 2 (two) times daily. 12/07/21   Laurann Montana, PA-C    Current Outpatient Medications  Medication Sig Dispense Refill   allopurinol (ZYLOPRIM) 100 MG tablet Take 1 tablet (100 mg total) by mouth daily. (Patient not taking: Reported on 12/18/2022) 90 tablet 3   amLODipine (NORVASC) 5 MG tablet Take 1 tablet (5 mg total) by mouth daily. 90 tablet 1   carvedilol (COREG) 25 MG tablet Take 1 tablet  (25 mg total) by mouth 2 (two) times daily. (Patient not taking: Reported on 10/31/2022) 180 tablet 3   nitroGLYCERIN (NITROSTAT) 0.4 MG SL tablet Place 1 tablet (0.4 mg total) under the tongue every 5 (five) minutes as needed for chest pain. (Patient not taking: Reported on 10/31/2022) 25 tablet 3   omega-3 acid ethyl esters (LOVAZA) 1 g capsule Take 1 capsule (1 g total) by mouth daily. (Patient not taking: Reported on 10/31/2022) 30 capsule 2   omeprazole (PRILOSEC) 40 MG capsule Take 1 capsule (40 mg total) by mouth daily as needed (Patient taking differently: Take 40 mg by mouth daily as needed. Takes PRN) 90 capsule 3   rosuvastatin (CRESTOR) 40 MG tablet Take 1 tablet (40 mg total) by mouth daily. 90 tablet 3   ticagrelor (BRILINTA) 90 MG TABS tablet Take 1 tablet (90 mg total) by mouth 2 (two) times daily. 60 tablet 11   Current Facility-Administered Medications  Medication Dose Route Frequency Provider Last Rate Last Admin   0.9 %  sodium chloride infusion  500 mL Intravenous Once Jahlani Lorentz V, DO        Allergies as of 12/18/2022 - Review Complete 12/18/2022  Allergen Reaction Noted   Poison ivy extract Itching 12/06/2021    Family History  Problem Relation Age of Onset   Colon cancer Neg Hx    Esophageal cancer Neg Hx    Rectal cancer Neg Hx    Stomach cancer Neg Hx     Social History   Socioeconomic History   Marital status: Widowed    Spouse name: Not on file   Number of children: Not on file   Years of education: Not on file   Highest education level: Not on file  Occupational History   Not on file  Tobacco Use   Smoking status: Former   Smokeless tobacco: Never  Vaping Use   Vaping status: Never Used  Substance and Sexual Activity   Alcohol use: Yes    Comment:  6 beers per a day    Drug use: Yes    Types: Marijuana    Comment: 1 joint a day, last used 12-16-22   Sexual activity: Not Currently  Other Topics Concern   Not on file  Social History  Narrative   Not on file   Social Determinants of Health   Financial Resource Strain: Medium Risk (12/12/2021)   Overall Financial Resource Strain (CARDIA)    Difficulty of Paying Living Expenses: Somewhat hard  Food Insecurity: No Food Insecurity (01/24/2022)   Hunger Vital Sign    Worried About Running Out of Food in the Last Year: Never true    Ran Out of Food in the Last Year: Never true  Transportation Needs: No Transportation Needs (01/24/2022)   PRAPARE - Administrator, Civil Service (Medical): No    Lack of Transportation (Non-Medical): No  Physical Activity: Not on file  Stress: Not on file  Social Connections:  Unknown (06/19/2021)   [facility]   Social Network    Social Network: Not on file  Intimate Partner Violence: Not At Risk (01/24/2022)   Humiliation, Afraid, Rape, and Kick questionnaire    Fear of Current or Ex-Partner: No    Emotionally Abused: No    Physically Abused: No    Sexually Abused: No    Physical Exam: Vital signs in last 24 hours: @BP  138/86   Pulse 88   Temp 98.1 F (36.7 C)   Resp 14   Ht 5\' 8"  (1.727 m)   Wt 269 lb (122 kg)   SpO2 96%   BMI 40.90 kg/m  GEN: NAD EYE: Sclerae anicteric ENT: MMM CV: Non-tachycardic Pulm: CTA b/l GI: Soft, NT/ND NEURO:  Alert & Oriented x 3   Doristine Locks, DO [facility]   12/18/2022 9:22 AM

## 2022-12-18 NOTE — Progress Notes (Signed)
Report to PACU, RN, vss, BBS= Clear.  

## 2022-12-18 NOTE — Patient Instructions (Signed)
**  Handouts given on Polyps, diverticulosis and hemorrhoids**   YOU HAD AN ENDOSCOPIC PROCEDURE TODAY AT THE [facility]:   Refer to the procedure report that was given to you for any specific questions about what was found during the examination.  If the procedure report does not answer your questions, please call your gastroenterologist to clarify.  If you requested that your care partner not be given the details of your procedure findings, then the procedure report has been included in a sealed envelope for you to review at your convenience later.  YOU SHOULD EXPECT: Some feelings of bloating in the abdomen. Passage of more gas than usual.  Walking can help get rid of the air that was put into your GI tract during the procedure and reduce the bloating. If you had a lower endoscopy (such as a colonoscopy or flexible sigmoidoscopy) you may notice spotting of blood in your stool or on the toilet paper. If you underwent a bowel prep for your procedure, you may not have a normal bowel movement for a few days.  Please Note:  You might notice some irritation and congestion in your nose or some drainage.  This is from the oxygen used during your procedure.  There is no need for concern and it should clear up in a day or so.  SYMPTOMS TO REPORT IMMEDIATELY:  Following lower endoscopy (colonoscopy or flexible sigmoidoscopy):  Excessive amounts of blood in the stool  Significant tenderness or worsening of abdominal pains  Swelling of the abdomen that is new, acute  Fever of 100F or higher  For urgent or emergent issues, a gastroenterologist can be reached at any hour by calling [phone]. Do not use MyChart messaging for urgent concerns.    DIET:  We do recommend a small meal at first, but then you may proceed to your regular diet.  Drink plenty of fluids but you should avoid alcoholic beverages for 24 hours.  ACTIVITY:  You should plan to take it easy for the rest of today and you  should NOT DRIVE or use heavy machinery until tomorrow (because of the sedation medicines used during the test).    FOLLOW UP: Our staff will call the number listed on your records the next business day following your procedure.  We will call around 7:15- 8:00 am to check on you and address any questions or concerns that you may have regarding the information given to you following your procedure. If we do not reach you, we will leave a message.     If any biopsies were taken you will be contacted by phone or by letter within the next 1-3 weeks.  Please call us at [phone] if you have not heard about the biopsies in 3 weeks.    SIGNATURES/CONFIDENTIALITY: You and/or your care partner have signed paperwork which will be entered into your electronic medical record.  These signatures attest to the fact that that the information above on your After Visit Summary has been reviewed and is understood.  Full responsibility of the confidentiality of this discharge information lies with you and/or your care-partner.

## 2022-12-18 NOTE — Op Note (Signed)
[facility] Patient Name: Gene Reed Procedure Date: 12/18/2022 9:15 AM MRN: [id number] Endoscopist: Doristine Locks , MD, [id number] Age: 52 Referring MD:  Date of Birth: 1970/08/11 Gender: Male Account #: [id number] Procedure:                Colonoscopy Indications:              This is the patient's first colonoscopy, Positive                            Cologuard test Medicines:                Monitored Anesthesia Care Procedure:                Pre-Anesthesia Assessment:                           - Prior to the procedure, a History and Physical                            was performed, and patient medications and                            allergies were reviewed. The patient's tolerance of                            previous anesthesia was also reviewed. The risks                            and benefits of the procedure and the sedation                            options and risks were discussed with the patient.                            All questions were answered, and informed consent                            was obtained. Prior Anticoagulants: The patient has                            taken Brilinta (ticagrelor), last dose was 5 days                            prior to procedure. ASA Grade Assessment: III - A                            patient with severe systemic disease. After                            reviewing the risks and benefits, the patient was                            deemed in satisfactory condition to undergo the  procedure.                           After obtaining informed consent, the colonoscope                            was passed under direct vision. Throughout the                            procedure, the patient's blood pressure, pulse, and                            oxygen saturations were monitored continuously. The                            CF [id number] #[id number] was introduced through the anus                             and advanced to the the cecum, identified by                            appendiceal orifice and ileocecal valve. The                            colonoscopy was performed without difficulty. The                            patient tolerated the procedure well. The quality                            of the bowel preparation was good. The ileocecal                            valve, appendiceal orifice, and rectum were                            photographed. Scope In: 9:32:21 AM Scope Out: 10:03:34 AM Scope Withdrawal Time: 0 hours 18 minutes 45 seconds  Total Procedure Duration: 0 hours 31 minutes 13 seconds  Findings:                 The perianal and digital rectal examinations were                            normal.                           Three sessile polyps were found in the sigmoid                            colon. The polyps were 5 to 12 mm in size. These                            polyps were removed with a cold snare. Resection  and retrieval were complete. Estimated blood loss                            was minimal.                           An 8 mm polyp was found in the ascending colon. The                            polyp was sessile. The polyp was removed with a                            cold snare. Resection and retrieval were complete.                            Due to difficult positioning , the base of the                            polyp was further resected with cold forceps via                            avulsion technique. This was completely resected                            and sent for histology. Estimated blood loss was                            minimal.                           A few small-mouthed diverticula were found in the                            sigmoid colon.                           Non-bleeding internal hemorrhoids were found during                            retroflexion. The hemorrhoids were small and Grade                             II (internal hemorrhoids that prolapse but reduce                            spontaneously).                           The ascending colon revealed significantly                            excessive looping. Advancing the scope required                            using manual pressure. Complications:  No immediate complications. Estimated Blood Loss:     Estimated blood loss was minimal. Impression:               - Three 5 to 12 mm polyps in the sigmoid colon,                            removed with a cold snare. Resected and retrieved.                           - One 8 mm polyp in the ascending colon, removed                            with a combination of cold snare and cold forceps.                            Resected and retrieved.                           - Diverticulosis in the sigmoid colon.                           - Non-bleeding internal hemorrhoids.                           - There was significant looping of the colon. Recommendation:           - Patient has a contact number available for                            emergencies. The signs and symptoms of potential                            delayed complications were discussed with the                            patient. Return to normal activities tomorrow.                            Written discharge instructions were provided to the                            patient.                           - Resume previous diet.                           - Continue present medications.                           - Await pathology results.                           - Repeat colonoscopy for surveillance based on  pathology results.                           - Return to GI office PRN.                           - Resume Brilinta (ticagrelor) at prior dose                            tomorrow. Doristine Locks, MD 12/18/2022 10:11:37 AM

## 2022-12-19 ENCOUNTER — Telehealth: Payer: Self-pay

## 2022-12-19 NOTE — Telephone Encounter (Signed)
  Follow up Call-     12/18/2022    9:00 AM  Call back number  Post procedure Call Back phone  # [phone]  Permission to leave phone message Yes     Patient questions:  Do you have a fever, pain , or abdominal swelling? No. Pain Score  0 *  Have you tolerated food without any problems? Yes.    Have you been able to return to your normal activities? Yes.    Do you have any questions about your discharge instructions: Diet   No. Medications  No. Follow up visit  No.  Do you have questions or concerns about your Care? No.  Actions: * If pain score is 4 or above: No action needed, pain <4.

## 2022-12-20 LAB — SURGICAL PATHOLOGY

## 2023-01-30 ENCOUNTER — Telehealth: Payer: Self-pay

## 2023-01-30 DIAGNOSIS — K760 Fatty (change of) liver, not elsewhere classified: Secondary | ICD-10-CM

## 2023-01-30 DIAGNOSIS — R7401 Elevation of levels of liver transaminase levels: Secondary | ICD-10-CM

## 2023-01-30 DIAGNOSIS — R748 Abnormal levels of other serum enzymes: Secondary | ICD-10-CM

## 2023-01-30 DIAGNOSIS — R7989 Other specified abnormal findings of blood chemistry: Secondary | ICD-10-CM

## 2023-01-30 NOTE — Telephone Encounter (Signed)
MyChart message sent to patient to go to lab for LFTs for Dr. Barron Alvine in the next week or two.  Orders entered.

## 2023-01-30 NOTE — Telephone Encounter (Signed)
-----   Message from [facility] R sent at 11/19/2022  9:08 AM EDT ----- Regarding: FW: January 2025  ----- Message ----- From: Lamona Curl, CMA Sent: 11/19/2022   9:07 AM EDT To: Lamona Curl, CMA Subject: January 2025                                   repeat liver enzymes in 3 months with consideration of repeat ultrasound elastography in 6-12 months.  As discussed in the office, may need to consider liver biopsy depending on labs and response to EtOH cessation.  Per VC

## 2023-02-06 ENCOUNTER — Ambulatory Visit: Payer: Self-pay | Admitting: Nurse Practitioner

## 2023-02-06 NOTE — Telephone Encounter (Signed)
  Chief Complaint: R knee swelling Symptoms: pain, swelling Frequency: several weeks Pertinent Negatives: Patient denies redness, denies fever, denies injury, denies warmth to the area  Disposition: [] ED /[x] Urgent Care (no appt availability in office) / [] Appointment(In office/virtual)/ []  [facility]/ [] Home Care/ [] Refused Recommended Disposition /[] [place] [facility]/ []  Follow-up with PCP  Additional Notes: Pt states hx of gout but this isn't the same. Impacts pt ADLs. Pain intermittent, swelling constant.  Pt booked with soonest appt at his clinic and other clinics he was willing to travel to, Jan 22. Advised to use ice 20min/4x per day. Advised to use OTC medication that he is normally allowed to take.   Copied from CRM [id number]. Topic: Clinical - Red Word Triage >> Feb 06, 2023  2:57 PM Antonio H wrote: Red Word that prompted transfer to Nurse Triage: swelling in right knee, not really in pain but mobility is limited Reason for Disposition  MILD or MODERATE swelling (e.g., can't move joint normally, can't do usual activities) (Exceptions: Itchy, localized swelling; swelling is chronic.)  Answer Assessment - Initial Assessment Questions 1. LOCATION: Where is the swelling located?  (e.g., left, right, both knees)     R knee laterally and medially,  2. ONSET: When did the swelling start? Does it come and go, or is it there all the time?     Several weeks ago, constantly swollen this entire time 3. SWELLING: How bad is the swelling? Or, How large is it? (e.g., mild, moderate, severe; size of localized swelling)    - NONE: No joint swelling.   - LOCALIZED: Localized; small area of puffy or swollen skin (e.g., insect bite, skin irritation).   - MILD: Joint looks or feels mildly swollen or puffy.   - MODERATE: Swollen; interferes with normal activities (e.g., work or school); can't move joint normally (bend and straighten completely); may be limping.   - SEVERE: Very  swollen; can't move swollen joint at all; limping a lot or unable to walk.     moderate 4. PAIN: Is there any pain? If Yes, ask: How bad is it? (Scale 1-10; or mild, moderate, severe)   - NONE (0): no pain.   - MILD (1-3): doesn't interfere with normal activities.    - MODERATE (4-7): interferes with normal activities (e.g., work or school) or awakens from sleep, limping.    - SEVERE (8-10): excruciating pain, unable to do any normal activities, unable to walk.      Moderate to severe 5. SETTING: Has there been any recent work, exercise or other activity that involved that part of the body?      denies 6. AGGRAVATING FACTORS: What makes the knee swelling worse? (e.g., walking, climbing stairs, running)     Climbing stairs is time consuming d/t lack of mobility 7. ASSOCIATED SYMPTOMS: Is there any pain or redness?     Denies redness, yes pain 8. OTHER SYMPTOMS: Do you have any other symptoms? (e.g., chest pain, difficulty breathing, fever, calf pain)     denies  Protocols used: Knee Swelling-A-AH

## 2023-02-26 ENCOUNTER — Ambulatory Visit: Payer: Self-pay | Admitting: Nurse Practitioner

## 2023-02-28 ENCOUNTER — Encounter: Payer: Self-pay | Admitting: Nurse Practitioner

## 2023-02-28 ENCOUNTER — Ambulatory Visit (HOSPITAL_COMMUNITY)
Admission: RE | Admit: 2023-02-28 | Discharge: 2023-02-28 | Disposition: A | Discharge status: Home / Self Care | Payer: Medicaid Other | Source: Ambulatory Visit | Attending: Nurse Practitioner

## 2023-02-28 ENCOUNTER — Other Ambulatory Visit: Payer: Self-pay

## 2023-02-28 ENCOUNTER — Ambulatory Visit (INDEPENDENT_AMBULATORY_CARE_PROVIDER_SITE_OTHER): Payer: Medicaid Other | Admitting: Nurse Practitioner

## 2023-02-28 ENCOUNTER — Other Ambulatory Visit: Payer: Self-pay | Admitting: Nurse Practitioner

## 2023-02-28 VITALS — BP 152/91 | HR 86 | Temp 96.0°F | Wt 277.0 lb

## 2023-02-28 DIAGNOSIS — M25561 Pain in right knee: Secondary | ICD-10-CM | POA: Diagnosis present

## 2023-02-28 DIAGNOSIS — G8929 Other chronic pain: Secondary | ICD-10-CM

## 2023-02-28 MED ORDER — AMLODIPINE BESYLATE 5 MG PO TABS
5.0000 mg | ORAL_TABLET | Freq: Every day | ORAL | 1 refills | Status: AC
  Filled 2023-02-28 – 2023-03-28 (×2): qty 90, 90d supply, fill #0
  Filled 2023-07-03: qty 90, 90d supply, fill #1

## 2023-02-28 MED ORDER — KETOROLAC TROMETHAMINE 60 MG/2ML IM SOLN
60.0000 mg | Freq: Once | INTRAMUSCULAR | Status: AC
Start: 2023-02-28 — End: 2023-02-28
  Administered 2023-02-28: 60 mg via INTRAMUSCULAR

## 2023-02-28 MED ORDER — PREDNISONE 10 MG PO TABS
ORAL_TABLET | ORAL | 0 refills | Status: AC
Start: 2023-02-28 — End: 2023-03-08
  Filled 2023-02-28: qty 20, 8d supply, fill #0

## 2023-02-28 NOTE — Progress Notes (Signed)
Subjective   Patient ID: Gene Reed, male    DOB: 1970-08-07, 53 y.o.   MRN: [id number]  Chief Complaint  Patient presents with   Knee Pain    Right  all around  and behind the knee    Referring provider: Ivonne Andrew, NP  Gene Reed is a 53 y.o. male with Past Medical History: No date: Adrenal nodule (HCC) No date: Arthritis No date: Bilateral chronic knee pain No date: Blood transfusion without reported diagnosis No date: Bone spur No date: CAD (coronary artery disease) No date: GERD (gastroesophageal reflux disease) No date: Gout No date: Hyperlipidemia No date: Hypertension No date: Sleep apnea     Comment:  wears CPAP No date: Substance abuse (HCC)   Knee Pain  The incident occurred more than 1 week ago. The incident occurred at home. The injury mechanism was a twisting injury. The pain is present in the right knee. The pain is at a severity of 7/10. The pain is moderate. The pain has been Intermittent since onset. Associated symptoms include a loss of motion. He reports no foreign bodies present. The symptoms are aggravated by movement. He has tried nothing for the symptoms. The treatment provided no relief.        Allergies  Allergen Reactions   Poison Ivy Extract Itching    Poison oak    Immunization History  Administered Date(s) Administered   PFIZER(Purple Top)SARS-COV-2 Vaccination 06/22/2019, 07/25/2019   Tdap 09/21/2015    Tobacco History: Social History   Tobacco Use  Smoking Status Former  Smokeless Tobacco Never   Counseling given: Not Answered   Outpatient Encounter Medications as of 02/28/2023  Medication Sig   amLODipine (NORVASC) 5 MG tablet Take 1 tablet (5 mg total) by mouth daily.   carvedilol (COREG) 25 MG tablet Take 1 tablet (25 mg total) by mouth 2 (two) times daily.   omega-3 acid ethyl esters (LOVAZA) 1 g capsule Take 1 capsule (1 g total) by mouth daily.   omeprazole (PRILOSEC) 40 MG capsule Take 1 capsule (40  mg total) by mouth daily as needed (Patient taking differently: Take 40 mg by mouth daily as needed. Takes PRN)   rosuvastatin (CRESTOR) 40 MG tablet Take 1 tablet (40 mg total) by mouth daily.   ticagrelor (BRILINTA) 90 MG TABS tablet Take 1 tablet (90 mg total) by mouth 2 (two) times daily.   allopurinol (ZYLOPRIM) 100 MG tablet Take 1 tablet (100 mg total) by mouth daily. (Patient not taking: Reported on 02/28/2023)   nitroGLYCERIN (NITROSTAT) 0.4 MG SL tablet Place 1 tablet (0.4 mg total) under the tongue every 5 (five) minutes as needed for chest pain. (Patient not taking: Reported on 02/28/2023)   Facility-Administered Encounter Medications as of 02/28/2023  Medication   ketorolac (TORADOL) injection 60 mg    Review of Systems  Review of Systems  Constitutional: Negative.   HENT: Negative.    Cardiovascular: Negative.   Gastrointestinal: Negative.   Allergic/Immunologic: Negative.   Neurological: Negative.   Psychiatric/Behavioral: Negative.       Objective:   BP (!) 152/91   Pulse 86   Temp (!) 96 F (35.6 C)   Wt 277 lb (125.6 kg)   SpO2 97%   BMI 42.12 kg/m   Wt Readings from Last 5 Encounters:  02/28/23 277 lb (125.6 kg)  12/18/22 269 lb (122 kg)  10/31/22 191 lb (86.6 kg)  09/23/22 268 lb (121.6 kg)  06/14/22 275 lb 3.2 oz (124.8  kg)     Physical Exam Vitals and nursing note reviewed.  Constitutional:      General: He is not in acute distress.    Appearance: He is well-developed.  Cardiovascular:     Rate and Rhythm: Normal rate and regular rhythm.  Pulmonary:     Effort: Pulmonary effort is normal.     Breath sounds: Normal breath sounds.  Musculoskeletal:     Right knee: Swelling present. No erythema. Decreased range of motion. Tenderness present.  Skin:    General: Skin is warm and dry.  Neurological:     Mental Status: He is alert and oriented to person, place, and time.       Assessment & Plan:   Chronic pain of right knee -      Ketorolac Tromethamine -     Ambulatory referral to Orthopedic Surgery -     DG Knee Complete 4 Views Right     Return if symptoms worsen or fail to improve.     Ivonne Andrew, NP 02/28/2023

## 2023-03-11 ENCOUNTER — Other Ambulatory Visit: Payer: Self-pay

## 2023-03-26 ENCOUNTER — Encounter: Payer: Self-pay | Admitting: Nurse Practitioner

## 2023-03-26 ENCOUNTER — Telehealth (INDEPENDENT_AMBULATORY_CARE_PROVIDER_SITE_OTHER): Payer: Self-pay | Admitting: Nurse Practitioner

## 2023-03-26 VITALS — Ht 69.0 in | Wt 277.0 lb

## 2023-03-26 DIAGNOSIS — E66813 Obesity, class 3: Secondary | ICD-10-CM

## 2023-03-26 DIAGNOSIS — Z6841 Body Mass Index (BMI) 40.0 and over, adult: Secondary | ICD-10-CM

## 2023-03-26 NOTE — Patient Instructions (Signed)
1. Class 3 severe obesity due to excess calories without serious comorbidity with body mass index (BMI) of 40.0 to 44.9 in adult (HCC) (Primary)  - Amb Ref to Medical Weight Management - CBC; Future - Comprehensive metabolic panel; Future - Lipid Panel; Future   Follow up:  Follow up in 6 months

## 2023-03-26 NOTE — Progress Notes (Signed)
Virtual Visit via Telephone Note  I connected with [name] on 03/26/23 at  2:20 PM EST by telephone and verified that I am speaking with the correct person using two identifiers.  Location: Patient: home Provider: remote   I discussed the limitations, risks, security and privacy concerns of performing an evaluation and management service by telephone and the availability of in person appointments. I also discussed with the patient that there may be a patient responsible charge related to this service. The patient expressed understanding and agreed to proceed.   History of Present Illness:  Patient presents today for a follow up visit through telephone visit. Overall he has been doing well. Will need to come in next week for labs. Patient is requesting a referral for medical weight management. Denies f/c/s, n/v/d, hemoptysis, PND, leg swelling Denies chest pain or edema     Observations/Objective:     03/26/2023    1:45 PM 02/28/2023   11:09 AM 02/28/2023   10:57 AM  Vitals with BMI  Height 5\' 9"     Weight 277 lbs  277 lbs  BMI 40.89    Systolic  152 152  Diastolic  91 91  Pulse   86     Assessment and Plan:  1. Class 3 severe obesity due to excess calories without serious comorbidity with body mass index (BMI) of 40.0 to 44.9 in adult (HCC) (Primary)  - Amb Ref to Medical Weight Management - CBC; Future - Comprehensive metabolic panel; Future - Lipid Panel; Future   Follow up:  Follow up in 6 months    I discussed the assessment and treatment plan with the patient. The patient was provided an opportunity to ask questions and all were answered. The patient agreed with the plan and demonstrated an understanding of the instructions.   The patient was advised to call back or seek an in-person evaluation if the symptoms worsen or if the condition fails to improve as anticipated.  I provided 23 minutes of non-face-to-face time during this encounter.   Ivonne Andrew, NP

## 2023-03-28 ENCOUNTER — Other Ambulatory Visit: Payer: Self-pay

## 2023-04-02 ENCOUNTER — Other Ambulatory Visit: Payer: Self-pay

## 2023-04-07 ENCOUNTER — Other Ambulatory Visit: Payer: Self-pay

## 2023-06-17 ENCOUNTER — Other Ambulatory Visit: Payer: Self-pay | Admitting: Nurse Practitioner

## 2023-06-17 ENCOUNTER — Other Ambulatory Visit: Payer: Self-pay | Admitting: Physician Assistant

## 2023-06-17 ENCOUNTER — Other Ambulatory Visit: Payer: Self-pay

## 2023-06-17 DIAGNOSIS — M109 Gout, unspecified: Secondary | ICD-10-CM

## 2023-06-17 MED ORDER — ALLOPURINOL 100 MG PO TABS
100.0000 mg | ORAL_TABLET | Freq: Every day | ORAL | 3 refills | Status: AC
Start: 2023-06-17 — End: ?
  Filled 2023-06-17: qty 90, 90d supply, fill #0

## 2023-06-17 MED ORDER — CARVEDILOL 25 MG PO TABS
25.0000 mg | ORAL_TABLET | Freq: Two times a day (BID) | ORAL | 0 refills | Status: DC
  Filled 2023-06-17: qty 60, 30d supply, fill #0

## 2023-06-26 ENCOUNTER — Other Ambulatory Visit: Payer: Self-pay

## 2023-07-03 ENCOUNTER — Other Ambulatory Visit: Payer: Self-pay

## 2023-07-30 NOTE — Telephone Encounter (Signed)
 error

## 2023-08-01 ENCOUNTER — Other Ambulatory Visit: Payer: Self-pay

## 2023-08-01 ENCOUNTER — Other Ambulatory Visit: Payer: Self-pay | Admitting: Nurse Practitioner

## 2023-08-01 MED ORDER — CARVEDILOL 25 MG PO TABS
25.0000 mg | ORAL_TABLET | Freq: Two times a day (BID) | ORAL | 0 refills | Status: AC
  Filled 2023-08-01: qty 60, 30d supply, fill #0

## 2023-08-01 NOTE — Telephone Encounter (Signed)
 Copied from CRM [id number]. Topic: Clinical - Medication Refill >> Aug 01, 2023 12:40 PM Tiffany S wrote: Medication: carvedilol  (COREG ) 25 MG tablet [[id number]]  Has the patient contacted their pharmacy? Yes (Agent: If no, request that the patient contact the pharmacy for the refill. If patient does not wish to contact the pharmacy document the reason why and proceed with request.) (Agent: If yes, when and what did the pharmacy advise?)  This is the patient's preferred pharmacy:  [facility] [address] [place] [address] Phone: [phone] Fax: [phone]   Is this the correct pharmacy for this prescription? Yes If no, delete pharmacy and type the correct one.   Has the prescription been filled recently? Yes  Is the patient out of the medication? Yes  Has the patient been seen for an appointment in the last year OR does the patient have an upcoming appointment? Yes  Can we respond through MyChart? Yes  Agent: Please be advised that Rx refills may take up to 3 business days. We ask that you follow-up with your pharmacy.

## 2023-08-04 ENCOUNTER — Other Ambulatory Visit: Payer: Self-pay

## 2023-08-18 ENCOUNTER — Encounter: Payer: Self-pay | Admitting: Nurse Practitioner

## 2023-08-18 ENCOUNTER — Other Ambulatory Visit: Payer: Self-pay

## 2023-08-18 ENCOUNTER — Ambulatory Visit (INDEPENDENT_AMBULATORY_CARE_PROVIDER_SITE_OTHER): Admitting: Nurse Practitioner

## 2023-08-18 ENCOUNTER — Ambulatory Visit: Payer: Self-pay

## 2023-08-18 VITALS — BP 147/87 | HR 83 | Wt 268.0 lb

## 2023-08-18 DIAGNOSIS — T7840XA Allergy, unspecified, initial encounter: Secondary | ICD-10-CM | POA: Diagnosis not present

## 2023-08-18 DIAGNOSIS — F1290 Cannabis use, unspecified, uncomplicated: Secondary | ICD-10-CM | POA: Insufficient documentation

## 2023-08-18 DIAGNOSIS — Z13220 Encounter for screening for lipoid disorders: Secondary | ICD-10-CM

## 2023-08-18 DIAGNOSIS — I10 Essential (primary) hypertension: Secondary | ICD-10-CM | POA: Diagnosis not present

## 2023-08-18 DIAGNOSIS — R0602 Shortness of breath: Secondary | ICD-10-CM | POA: Diagnosis not present

## 2023-08-18 MED ORDER — FLUTICASONE PROPIONATE 50 MCG/ACT NA SUSP
2.0000 | Freq: Every day | NASAL | 6 refills | Status: AC
  Filled 2023-08-18: qty 16, 30d supply, fill #0
  Filled 2023-10-08: qty 16, 30d supply, fill #1

## 2023-08-18 MED ORDER — ALBUTEROL SULFATE HFA 108 (90 BASE) MCG/ACT IN AERS
2.0000 | INHALATION_SPRAY | Freq: Four times a day (QID) | RESPIRATORY_TRACT | 2 refills | Status: DC | PRN
  Filled 2023-08-18: qty 18, 25d supply, fill #0
  Filled 2023-10-08: qty 18, 25d supply, fill #1

## 2023-08-18 NOTE — Patient Instructions (Addendum)
 1. Shortness of breath (Primary)  - CBC - CMP14+EGFR - Brain natriuretic peptide - DG Chest 2 View; Future - albuterol  (VENTOLIN  HFA) 108 (90 Base) MCG/ACT inhaler; Inhale 2 puffs into the lungs every 6 (six) hours as needed for wheezing or shortness of breath.  Dispense: 8 g; Refill: 2  2. Lipid screening  - Lipid panel  3. Allergy, initial encounter  - fluticasone  (FLONASE ) 50 MCG/ACT nasal spray; Place 2 sprays into both nostrils daily.  Dispense: 16 g; Refill: 6      It is important that you exercise regularly at least 30 minutes 5 times a week as tolerated  Think about what you will eat, plan ahead. Choose  clean, green, fresh or frozen over canned, processed or packaged foods which are more sugary, salty and fatty. 70 to 75% of food eaten should be vegetables and fruit. Three meals at set times with snacks allowed between meals, but they must be fruit or vegetables. Aim to eat over a 12 hour period , example 7 am to 7 pm, and STOP after  your last meal of the day. Drink water,generally about 64 ounces per day, no other drink is as healthy. Fruit juice is best enjoyed in a healthy way, by EATING the fruit.  Thanks for choosing [facility] we consider it a privelige to serve you.

## 2023-08-18 NOTE — Telephone Encounter (Signed)
 FYI Only or Action Required?: FYI only for provider.  Patient was last seen in primary care on 03/26/2023 by Oley Bascom RAMAN, NP.  Called Nurse Triage reporting Shortness of Breath.  Symptoms began several weeks ago.  Interventions attempted: OTC medications: OTC inhaler.  Symptoms are: gradually worsening.  Triage Disposition: See HCP Within 4 Hours (Or PCP Triage)  Patient/caregiver understands and will follow disposition?: Yes         Copied from CRM [id number]. Topic: Clinical - Red Word Triage >> Aug 18, 2023  9:35 AM Antonio DEL wrote: Red Word that prompted transfer to Nurse Triage: Patient states his throat shrunk up last night and wasn't allowing him to inhale, he could breathe out but couldn't breathe and bring in any oxygen in through his nose or throat. Reason for Disposition  [1] Longstanding difficulty breathing (e.g., CHF, COPD, emphysema) AND [2] WORSE than normal  Answer Assessment - Initial Assessment Questions 1. RESPIRATORY STATUS: Describe your breathing? (e.g., wheezing, shortness of breath, unable to speak, severe coughing)      Throat tight 2. ONSET: When did this breathing problem begin?      One episode yesterday, worse than usual 3. PATTERN Does the difficult breathing come and go, or has it been constant since it started?      Intermittent  5. RECURRENT SYMPTOM: Have you had difficulty breathing before? If Yes, ask: When was the last time? and What happened that time?      Yes, worsening episodes 6. CARDIAC HISTORY: Do you have any history of heart disease? (e.g., heart attack, angina, bypass surgery, angioplasty)      HTN 8. CAUSE: What do you think is causing the breathing problem?      Unknown 9. OTHER SYMPTOMS: Do you have any other symptoms? (e.g., chest pain, cough, dizziness, fever, runny nose)     Wheezing  Protocols used: Breathing Difficulty-A-AH

## 2023-08-18 NOTE — Assessment & Plan Note (Signed)
 BP Readings from Last 3 Encounters:  08/18/23 (!) 147/87  02/28/23 (!) 152/91  12/18/22 (!) 142/92    On amlodipine  5 mg daily, carvedilol  25 mg twice daily Stated that he has not taken both medications today Advised to take medications daily as ordered Follow-up with PCP as planned

## 2023-08-18 NOTE — Assessment & Plan Note (Signed)
-   fluticasone (FLONASE) 50 MCG/ACT nasal spray; Place 2 sprays into both nostrils daily.  Dispense: 16 g; Refill: 6 Avoid allergens

## 2023-08-18 NOTE — Telephone Encounter (Signed)
 No appts available this week.   Patient will need to be evaluated in UC or ED if having difficulty breathing.   Call could not be completed at this time, no vm available.  WCB

## 2023-08-18 NOTE — Assessment & Plan Note (Signed)
 -   CBC - CMP14+EGFR - Brain natriuretic peptide - DG Chest 2 View; Future - albuterol  (VENTOLIN  HFA) 108 (90 Base) MCG/ACT inhaler; Inhale 2 puffs into the lungs every 6 (six) hours as needed for wheezing or shortness of breath.  Dispense: 18 g; Refill: 2

## 2023-08-18 NOTE — Progress Notes (Signed)
 Acute Office Visit  Subjective:     Patient ID: Gene Reed, male    DOB: [birth year], 53 y.o.   MRN: [id number]  Chief Complaint  Patient presents with   Shortness of Breath    HPI Mr Oregel has a past medical history of Adrenal nodule (HCC), Arthritis, Bilateral chronic knee pain, Blood transfusion without reported diagnosis, Bone spur, CAD (coronary artery disease), GERD (gastroesophageal reflux disease), Gout, Hyperlipidemia, Hypertension, Sleep apnea, and Substance abuse (HCC). Patient is in today for c/o of intermittent shortness of breath for a year sometimes feels like his nose is closing up and has intermittent wheezing. Has Cough every now and then with white or yellow-colored phlegm.  He also reports sneezing, runny nose, watery eyes, he denies smoking cigarettes, he does smokes marijuana now cutting back . SOB Last about  3-5 mintures. Has ''Chest cramps'' every now and then lasting for 10 seconds .  Stated that he had an episode of shortness of breath and wheezing last night that resolved after using an albuterol  inhaler that he got from someone.  He currently denies wheezing, shortness of breath, cough   Review of Systems  Constitutional:  Negative for appetite change, chills, fatigue and fever.  HENT:  Negative for congestion, postnasal drip, rhinorrhea and sneezing.   Respiratory:  Negative for cough, shortness of breath and wheezing.   Cardiovascular:  Negative for chest pain, palpitations and leg swelling.  Gastrointestinal:  Negative for abdominal pain, constipation, nausea and vomiting.  Genitourinary:  Negative for difficulty urinating, dysuria, flank pain and frequency.  Musculoskeletal:  Negative for arthralgias, back pain, joint swelling and myalgias.  Skin:  Negative for color change, pallor, rash and wound.  Neurological:  Negative for dizziness, facial asymmetry, weakness, numbness and headaches.  Psychiatric/Behavioral:  Negative for behavioral problems,  confusion, self-injury and suicidal ideas.         Objective:    BP (!) 147/87   Pulse 83   Wt 268 lb (121.6 kg)   SpO2 98%   BMI 39.58 kg/m    Physical Exam Vitals and nursing note reviewed.  Constitutional:      General: He is not in acute distress.    Appearance: Normal appearance. He is obese. He is not ill-appearing, toxic-appearing or diaphoretic.  HENT:     Right Ear: Tympanic membrane, ear canal and external ear normal. There is no impacted cerumen.     Left Ear: Tympanic membrane, ear canal and external ear normal. There is no impacted cerumen.     Nose: Congestion present. No rhinorrhea.     Mouth/Throat:     Mouth: Mucous membranes are moist.     Pharynx: Oropharynx is clear. No oropharyngeal exudate or posterior oropharyngeal erythema.  Eyes:     General: No scleral icterus.       Right eye: No discharge.        Left eye: No discharge.     Extraocular Movements: Extraocular movements intact.     Conjunctiva/sclera: Conjunctivae normal.  Cardiovascular:     Rate and Rhythm: Normal rate and regular rhythm.     Pulses: Normal pulses.     Heart sounds: Normal heart sounds. No murmur heard.    No friction rub. No gallop.  Pulmonary:     Effort: Pulmonary effort is normal. No respiratory distress.     Breath sounds: Normal breath sounds. No stridor. No wheezing, rhonchi or rales.  Chest:     Chest wall: No tenderness.  Abdominal:  General: There is no distension.     Palpations: Abdomen is soft.     Tenderness: There is no abdominal tenderness. There is no right CVA tenderness, left CVA tenderness or guarding.  Musculoskeletal:        General: No swelling, tenderness, deformity or signs of injury.     Right lower leg: No edema.     Left lower leg: No edema.  Skin:    General: Skin is warm and dry.     Capillary Refill: Capillary refill takes less than 2 seconds.     Coloration: Skin is not jaundiced or pale.     Findings: No bruising, erythema or  lesion.  Neurological:     Mental Status: He is alert and oriented to person, place, and time.     Motor: No weakness.     Coordination: Coordination normal.     Gait: Gait normal.  Psychiatric:        Mood and Affect: Mood normal.        Behavior: Behavior normal.        Thought Content: Thought content normal.        Judgment: Judgment normal.     No results found for any visits on 08/18/23.      Assessment & Plan:   Problem List Items Addressed This Visit       Cardiovascular and Mediastinum   Hypertension   BP Readings from Last 3 Encounters:  08/18/23 (!) 147/87  02/28/23 (!) 152/91  12/18/22 (!) 142/92    On amlodipine  5 mg daily, carvedilol  25 mg twice daily Stated that he has not taken both medications today Advised to take medications daily as ordered Follow-up with PCP as planned        Other   Allergies    - fluticasone  (FLONASE ) 50 MCG/ACT nasal spray; Place 2 sprays into both nostrils daily.  Dispense: 16 g; Refill: 6  Avoid allergens      Relevant Medications   fluticasone  (FLONASE ) 50 MCG/ACT nasal spray   Shortness of breath - Primary     - CBC - CMP14+EGFR - Brain natriuretic peptide - DG Chest 2 View; Future - albuterol  (VENTOLIN  HFA) 108 (90 Base) MCG/ACT inhaler; Inhale 2 puffs into the lungs every 6 (six) hours as needed for wheezing or shortness of breath.  Dispense: 18 g; Refill: 2       Relevant Medications   albuterol  (VENTOLIN  HFA) 108 (90 Base) MCG/ACT inhaler   Other Relevant Orders   CBC   CMP14+EGFR   Brain natriuretic peptide   DG Chest 2 View   Ambulatory referral to Pulmonology   Marijuana smoker   Need to avoid smoking marijuana discussed      Other Visit Diagnoses       Lipid screening       Relevant Orders   Lipid panel       Meds ordered this encounter  Medications   albuterol  (VENTOLIN  HFA) 108 (90 Base) MCG/ACT inhaler    Sig: Inhale 2 puffs into the lungs every 6 (six) hours as needed for  wheezing or shortness of breath.    Dispense:  18 g    Refill:  2    filled for 18g pkg size due to stock   fluticasone  (FLONASE ) 50 MCG/ACT nasal spray    Sig: Place 2 sprays into both nostrils daily.    Dispense:  16 g    Refill:  6    No follow-ups on file.  Yorley Buch R Montavis Schubring, FNP

## 2023-08-18 NOTE — Assessment & Plan Note (Signed)
Need to avoid smoking marijuana discussed 

## 2023-08-18 NOTE — Telephone Encounter (Signed)
 Attempted to reach patient, still receiving recording that call cannot be completed at this time.

## 2023-08-20 ENCOUNTER — Other Ambulatory Visit: Payer: Self-pay | Admitting: Nurse Practitioner

## 2023-08-20 ENCOUNTER — Ambulatory Visit: Payer: Self-pay | Admitting: Nurse Practitioner

## 2023-08-20 DIAGNOSIS — E785 Hyperlipidemia, unspecified: Secondary | ICD-10-CM

## 2023-08-20 LAB — CMP14+EGFR
ALT: 35 IU/L (ref 0–44)
AST: 62 IU/L — ABNORMAL HIGH (ref 0–40)
Albumin: 4.6 g/dL (ref 3.8–4.9)
Alkaline Phosphatase: 125 IU/L — ABNORMAL HIGH (ref 44–121)
BUN/Creatinine Ratio: 11 (ref 9–20)
BUN: 7 mg/dL (ref 6–24)
Bilirubin Total: 0.9 mg/dL (ref 0.0–1.2)
CO2: 19 mmol/L — ABNORMAL LOW (ref 20–29)
Calcium: 9.2 mg/dL (ref 8.7–10.2)
Chloride: 102 mmol/L (ref 96–106)
Creatinine, Ser: 0.63 mg/dL — ABNORMAL LOW (ref 0.76–1.27)
Globulin, Total: 3.6 g/dL (ref 1.5–4.5)
Glucose: 90 mg/dL (ref 70–99)
Potassium: 3.8 mmol/L (ref 3.5–5.2)
Sodium: 139 mmol/L (ref 134–144)
Total Protein: 8.2 g/dL (ref 6.0–8.5)
eGFR: 114 mL/min/1.73 (ref >59)

## 2023-08-20 LAB — LIPID PANEL
Chol/HDL Ratio: 5.5 ratio — ABNORMAL HIGH (ref 0.0–5.0)
Cholesterol, Total: 230 mg/dL — ABNORMAL HIGH (ref 100–199)
HDL: 42 mg/dL (ref >39)
LDL Chol Calc (NIH): 139 mg/dL — ABNORMAL HIGH (ref 0–99)
Triglycerides: 269 mg/dL — ABNORMAL HIGH (ref 0–149)
VLDL Cholesterol Cal: 49 mg/dL — ABNORMAL HIGH (ref 5–40)

## 2023-08-20 LAB — BRAIN NATRIURETIC PEPTIDE: BNP: 52.8 pg/mL (ref 0.0–100.0)

## 2023-08-20 LAB — CBC
Hematocrit: 40.8 % (ref 37.5–51.0)
Hemoglobin: 13.9 g/dL (ref 13.0–17.7)
MCH: 32 pg (ref 26.6–33.0)
MCHC: 34.1 g/dL (ref 31.5–35.7)
MCV: 94 fL (ref 79–97)
Platelets: 247 x10E3/uL (ref 150–450)
RBC: 4.35 x10E6/uL (ref 4.14–5.80)
RDW: 12.6 % (ref 11.6–15.4)
WBC: 9 x10E3/uL (ref 3.4–10.8)

## 2023-10-01 ENCOUNTER — Encounter: Payer: Self-pay | Admitting: Nurse Practitioner

## 2023-10-01 ENCOUNTER — Ambulatory Visit: Payer: Self-pay | Admitting: Nurse Practitioner

## 2023-10-01 VITALS — BP 132/73 | HR 79 | Temp 97.5°F | Wt 266.8 lb

## 2023-10-01 DIAGNOSIS — E559 Vitamin D deficiency, unspecified: Secondary | ICD-10-CM

## 2023-10-01 DIAGNOSIS — E538 Deficiency of other specified B group vitamins: Secondary | ICD-10-CM | POA: Diagnosis not present

## 2023-10-01 DIAGNOSIS — I10 Essential (primary) hypertension: Secondary | ICD-10-CM

## 2023-10-01 NOTE — Progress Notes (Signed)
 Subjective   Patient ID: Gene Reed, male    DOB: 1970-06-26, 53 y.o.   MRN: [id number]  Chief Complaint  Patient presents with   Medical Management of Chronic Issues    Referring provider: Oley Bascom RAMAN, NP  Gene Reed is a 53 y.o. male with Past Medical History: No date: Adrenal nodule No date: Arthritis No date: Bilateral chronic knee pain No date: Blood transfusion without reported diagnosis No date: Bone spur No date: CAD (coronary artery disease) No date: GERD (gastroesophageal reflux disease) No date: Gout No date: Hyperlipidemia No date: Hypertension No date: Sleep apnea     Comment:  wears CPAP No date: Substance abuse [facility])   HPI  Patient presents today for a follow-up visit.  He does not need any refills on medications today.  He does not have any new issues or concerns today.  He does need blood work today.  Overall doing well Denies f/c/s, n/v/d, hemoptysis, PND, leg swelling Denies chest pain or edema      Allergies  Allergen Reactions   Poison Ivy Extract Itching    Poison oak    Immunization History  Administered Date(s) Administered   PFIZER(Purple Top)SARS-COV-2 Vaccination 06/22/2019, 07/25/2019   Tdap 09/21/2015    Tobacco History: Social History   Tobacco Use  Smoking Status Former  Smokeless Tobacco Never   Counseling given: Not Answered   Outpatient Encounter Medications as of 10/01/2023  Medication Sig   albuterol  (VENTOLIN  HFA) 108 (90 Base) MCG/ACT inhaler Inhale 2 puffs into the lungs every 6 (six) hours as needed for wheezing or shortness of breath.   amLODipine  (NORVASC ) 5 MG tablet Take 1 tablet (5 mg total) by mouth daily.   carvedilol  (COREG ) 25 MG tablet Take 1 tablet (25 mg total) by mouth 2 (two) times daily.   fluticasone  (FLONASE ) 50 MCG/ACT nasal spray Place 2 sprays into both nostrils daily.   nitroGLYCERIN  (NITROSTAT ) 0.4 MG SL tablet Place 1 tablet (0.4 mg total) under the tongue every 5 (five)  minutes as needed for chest pain.   omeprazole  (PRILOSEC) 40 MG capsule Take 1 capsule (40 mg total) by mouth daily as needed (Patient taking differently: Take 40 mg by mouth daily as needed. Takes PRN)   rosuvastatin  (CRESTOR ) 40 MG tablet Take 1 tablet (40 mg total) by mouth daily.   ticagrelor  (BRILINTA ) 90 MG TABS tablet Take 1 tablet (90 mg total) by mouth 2 (two) times daily.   allopurinol  (ZYLOPRIM ) 100 MG tablet Take 1 tablet (100 mg total) by mouth daily.   omega-3 acid ethyl esters (LOVAZA ) 1 g capsule Take 1 capsule (1 g total) by mouth daily. (Patient not taking: Reported on 03/26/2023)   No facility-administered encounter medications on file as of 10/01/2023.    Review of Systems  Review of Systems  Constitutional: Negative.   HENT: Negative.    Cardiovascular: Negative.   Gastrointestinal: Negative.   Allergic/Immunologic: Negative.   Neurological: Negative.   Psychiatric/Behavioral: Negative.       Objective:   BP 132/73   Pulse 79   Temp (!) 97.5 F (36.4 C) (Oral)   Wt 266 lb 12.8 oz (121 kg)   SpO2 99%   BMI 39.40 kg/m   Wt Readings from Last 5 Encounters:  10/01/23 266 lb 12.8 oz (121 kg)  08/18/23 268 lb (121.6 kg)  03/26/23 277 lb (125.6 kg)  02/28/23 277 lb (125.6 kg)  12/18/22 269 lb (122 kg)     Physical Exam  Vitals and nursing note reviewed.  Constitutional:      General: He is not in acute distress.    Appearance: He is well-developed.  Cardiovascular:     Rate and Rhythm: Normal rate and regular rhythm.  Pulmonary:     Effort: Pulmonary effort is normal.     Breath sounds: Normal breath sounds.  Skin:    General: Skin is warm and dry.  Neurological:     Mental Status: He is alert and oriented to person, place, and time.       Assessment & Plan:   Primary hypertension -     CBC -     Comprehensive metabolic panel with GFR  Vitamin D  deficiency -     VITAMIN D  25 Hydroxy (Vit-D Deficiency, Fractures)  Vitamin B12  deficiency -     Vitamin B12     Return in about 3 months (around 01/01/2024).   Bascom GORMAN Borer, NP 10/30/2023

## 2023-10-03 ENCOUNTER — Other Ambulatory Visit: Payer: Self-pay

## 2023-10-03 ENCOUNTER — Ambulatory Visit: Payer: Self-pay | Admitting: Nurse Practitioner

## 2023-10-03 MED ORDER — VITAMIN D (ERGOCALCIFEROL) 1.25 MG (50000 UNIT) PO CAPS
50000.0000 [IU] | ORAL_CAPSULE | ORAL | 2 refills | Status: AC
  Filled 2023-10-03: qty 4, 28d supply, fill #0

## 2023-10-07 LAB — CBC
Hematocrit: 42.4 % (ref 37.5–51.0)
Hemoglobin: 14.5 g/dL (ref 13.0–17.7)
MCH: 32.1 pg (ref 26.6–33.0)
MCHC: 34.2 g/dL (ref 31.5–35.7)
MCV: 94 fL (ref 79–97)
Platelets: 230 x10E3/uL (ref 150–450)
RBC: 4.52 x10E6/uL (ref 4.14–5.80)
RDW: 12.8 % (ref 11.6–15.4)
WBC: 10.7 x10E3/uL (ref 3.4–10.8)

## 2023-10-07 LAB — COMPREHENSIVE METABOLIC PANEL WITH GFR
ALT: 27 IU/L (ref 0–44)
AST: 35 IU/L (ref 0–40)
Albumin: 4.7 g/dL (ref 3.8–4.9)
Alkaline Phosphatase: 125 IU/L — ABNORMAL HIGH (ref 44–121)
BUN/Creatinine Ratio: 15 (ref 9–20)
BUN: 11 mg/dL (ref 6–24)
Bilirubin Total: 1.1 mg/dL (ref 0.0–1.2)
Calcium: 9 mg/dL (ref 8.7–10.2)
Chloride: 104 mmol/L (ref 96–106)
Creatinine, Ser: 0.71 mg/dL — ABNORMAL LOW (ref 0.76–1.27)
Globulin, Total: 3.4 g/dL (ref 1.5–4.5)
Glucose: 128 mg/dL — ABNORMAL HIGH (ref 70–99)
Potassium: 3.8 mmol/L (ref 3.5–5.2)
Sodium: 142 mmol/L (ref 134–144)
Total Protein: 8.1 g/dL (ref 6.0–8.5)
eGFR: 110 mL/min/1.73 (ref >59)

## 2023-10-07 LAB — VITAMIN D 25 HYDROXY (VIT D DEFICIENCY, FRACTURES): Vit D, 25-Hydroxy: 10 ng/mL — ABNORMAL LOW (ref 30.0–100.0)

## 2023-10-07 LAB — VITAMIN B12: Vitamin B-12: 263 pg/mL (ref 232–1245)

## 2023-10-08 ENCOUNTER — Other Ambulatory Visit: Payer: Self-pay

## 2023-10-10 ENCOUNTER — Other Ambulatory Visit: Payer: Self-pay

## 2023-11-04 ENCOUNTER — Ambulatory Visit

## 2023-11-04 ENCOUNTER — Other Ambulatory Visit: Payer: Self-pay

## 2023-11-04 VITALS — BP 150/94 | HR 82 | Temp 98.2°F | Ht 69.0 in | Wt 242.6 lb

## 2023-11-04 DIAGNOSIS — K219 Gastro-esophageal reflux disease without esophagitis: Secondary | ICD-10-CM

## 2023-11-04 DIAGNOSIS — J984 Other disorders of lung: Secondary | ICD-10-CM

## 2023-11-04 DIAGNOSIS — Z6835 Body mass index (BMI) 35.0-35.9, adult: Secondary | ICD-10-CM

## 2023-11-04 DIAGNOSIS — J309 Allergic rhinitis, unspecified: Secondary | ICD-10-CM

## 2023-11-04 DIAGNOSIS — Z8679 Personal history of other diseases of the circulatory system: Secondary | ICD-10-CM

## 2023-11-04 DIAGNOSIS — E669 Obesity, unspecified: Secondary | ICD-10-CM

## 2023-11-04 DIAGNOSIS — R0602 Shortness of breath: Secondary | ICD-10-CM

## 2023-11-04 LAB — CBC WITH DIFFERENTIAL/PLATELET
Basophils Absolute: 0.1 K/uL (ref 0.0–0.1)
Basophils Relative: 1 % (ref 0.0–3.0)
Eosinophils Absolute: 0.2 K/uL (ref 0.0–0.7)
Eosinophils Relative: 2.1 % (ref 0.0–5.0)
HCT: 39.1 % (ref 39.0–52.0)
Hemoglobin: 13.4 g/dL (ref 13.0–17.0)
Lymphocytes Relative: 37.8 % (ref 12.0–46.0)
Lymphs Abs: 3.6 K/uL (ref 0.7–4.0)
MCHC: 34.3 g/dL (ref 30.0–36.0)
MCV: 90.7 fl (ref 78.0–100.0)
Monocytes Absolute: 0.7 K/uL (ref 0.1–1.0)
Monocytes Relative: 7.7 % (ref 3.0–12.0)
Neutro Abs: 4.9 K/uL (ref 1.4–7.7)
Neutrophils Relative %: 51.4 % (ref 43.0–77.0)
Platelets: 219 K/uL (ref 150.0–400.0)
RBC: 4.31 Mil/uL (ref 4.22–5.81)
RDW: 13.2 % (ref 11.5–15.5)
WBC: 9.5 K/uL (ref 4.0–10.5)

## 2023-11-04 MED ORDER — VENTOLIN HFA 108 (90 BASE) MCG/ACT IN AERS
2.0000 | INHALATION_SPRAY | RESPIRATORY_TRACT | 2 refills | Status: AC | PRN
  Filled 2023-11-04: qty 18, 25d supply, fill #0

## 2023-11-04 NOTE — Patient Instructions (Signed)
 It was a pleasure to see you today. Please have your labs drawn in our clinic today. Your pulmonary function test will be scheduled closer to your next follow up visit. Your will receive a call for your CT chest scheduling.

## 2023-11-04 NOTE — Assessment & Plan Note (Addendum)
 Patient was instructed not to lay on bed earlier than 2 hours after last meal, was advised to avoid late night meals/snacks and to avoid coffee and caffeine as much as possible. Agree with omeprazole  orally daily.

## 2023-11-04 NOTE — Progress Notes (Signed)
 New Patient Pulmonology Office Visit   Subjective:  Patient ID: Gene Reed, male    DOB: 1970-02-28  MRN: [id number]  Referred by: Paseda, Folashade R, FNP  CC:  Chief Complaint  Patient presents with   Consult    Sob     HPI Gene Reed is a 53 y.o. male with history of CAD, obesity, hypertension, hyperlipidemia who is referred to this clinic for shortness of breath.  Patient reports chronic shortness of breath with exertion.  He has a known history of CAD and stents.  Was supposed to follow-up with cardiology but is past due  He reports about 2 months ago, he developed sudden tightness in his chest and felt like his throat was closing and could not inhale.  He was able to exhale.  His friend  got him a rescue inhaler which eventually helped.  Patient reports that concern him and hence he requested for pulmonary referral.  Patient believes he was wheezing at that time. He has history of allergic rhinosinusitis.  And uses Flonase  as needed It seems like he was prescribed albuterol  by his primary care provider, however patient believes it is Dulera or Asmanex.  Patient is obese with BMI of 35.  Reports chronic obesity with some fluctuations in his weight  Denies personal or childhood history of asthma.  He reports former smoking history in his 75s.  Denies heavy smoking history.  He does smoke weed once a day.       ROS Review of symptoms negative except mentioned above  Allergies: Poison ivy extract  Current Outpatient Medications:    albuterol  (VENTOLIN  HFA) 108 (90 Base) MCG/ACT inhaler, Inhale 2 puffs into the lungs every 4 (four) hours as needed for wheezing or shortness of breath., Disp: 18 g, Rfl: 2   allopurinol  (ZYLOPRIM ) 100 MG tablet, Take 1 tablet (100 mg total) by mouth daily., Disp: 90 tablet, Rfl: 3   amLODipine  (NORVASC ) 5 MG tablet, Take 1 tablet (5 mg total) by mouth daily., Disp: 90 tablet, Rfl: 1   carvedilol  (COREG ) 25 MG tablet, Take 1 tablet  (25 mg total) by mouth 2 (two) times daily., Disp: 60 tablet, Rfl: 0   fluticasone  (FLONASE ) 50 MCG/ACT nasal spray, Place 2 sprays into both nostrils daily., Disp: 16 g, Rfl: 6   nitroGLYCERIN  (NITROSTAT ) 0.4 MG SL tablet, Place 1 tablet (0.4 mg total) under the tongue every 5 (five) minutes as needed for chest pain., Disp: 25 tablet, Rfl: 3   omeprazole  (PRILOSEC) 40 MG capsule, Take 1 capsule (40 mg total) by mouth daily as needed (Patient taking differently: Take 40 mg by mouth daily as needed. Takes PRN), Disp: 90 capsule, Rfl: 3   rosuvastatin  (CRESTOR ) 40 MG tablet, Take 1 tablet (40 mg total) by mouth daily., Disp: 90 tablet, Rfl: 3   ticagrelor  (BRILINTA ) 90 MG TABS tablet, Take 1 tablet (90 mg total) by mouth 2 (two) times daily., Disp: 60 tablet, Rfl: 11   Vitamin D , Ergocalciferol , (DRISDOL ) 1.25 MG (50000 UNIT) CAPS capsule, Take 1 capsule (50,000 Units total) by mouth every 7 (seven) days., Disp: 5 capsule, Rfl: 2   omega-3 acid ethyl esters (LOVAZA ) 1 g capsule, Take 1 capsule (1 g total) by mouth daily. (Patient not taking: Reported on 11/04/2023), Disp: 30 capsule, Rfl: 2 Past Medical History:  Diagnosis Date   Adrenal nodule    Arthritis    Bilateral chronic knee pain    Blood transfusion without reported diagnosis  Bone spur    CAD (coronary artery disease)    GERD (gastroesophageal reflux disease)    Gout    Hyperlipidemia    Hypertension    Sleep apnea    wears CPAP   Substance abuse [facility])    Past Surgical History:  Procedure Laterality Date   CORONARY LITHOTRIPSY N/A 12/06/2021   Procedure: CORONARY LITHOTRIPSY;  Surgeon: Burnard Debby LABOR, MD;  Location: [facility] INVASIVE CV LAB;  Service: Cardiovascular;  Laterality: N/A;   CORONARY STENT INTERVENTION Right 12/06/2021   Procedure: CORONARY STENT INTERVENTION;  Surgeon: Burnard Debby LABOR, MD;  Location: [facility] INVASIVE CV LAB;  Service: Cardiovascular;  Laterality: Right;   ESOPHAGOGASTRODUODENOSCOPY Left 01/23/2022    Procedure: ESOPHAGOGASTRODUODENOSCOPY (EGD);  Surgeon: San Sandor GAILS, DO;  Location: [facility] ENDOSCOPY;  Service: Gastroenterology;  Laterality: Left;   HOT HEMOSTASIS N/A 01/23/2022   Procedure: HOT HEMOSTASIS (ARGON PLASMA COAGULATION/BICAP);  Surgeon: San Sandor GAILS, DO;  Location: [facility] ENDOSCOPY;  Service: Gastroenterology;  Laterality: N/A;   KNEE SURGERY     LEFT HEART CATH AND CORONARY ANGIOGRAPHY N/A 12/06/2021   Procedure: LEFT HEART CATH AND CORONARY ANGIOGRAPHY;  Surgeon: Burnard Debby LABOR, MD;  Location: [facility] INVASIVE CV LAB;  Service: Cardiovascular;  Laterality: N/A;   UPPER GASTROINTESTINAL ENDOSCOPY     Family History  Problem Relation Age of Onset   Colon cancer Neg Hx    Esophageal cancer Neg Hx    Rectal cancer Neg Hx    Stomach cancer Neg Hx    Social History   Socioeconomic History   Marital status: Widowed    Spouse name: Not on file   Number of children: Not on file   Years of education: Not on file   Highest education level: Not on file  Occupational History   Not on file  Tobacco Use   Smoking status: Former   Smokeless tobacco: Never  Vaping Use   Vaping status: Never Used  Substance and Sexual Activity   Alcohol use: Yes    Comment:  6 beers per a day    Drug use: Yes    Types: Marijuana    Comment: 1 joint a day, last used 12-16-22   Sexual activity: Not Currently  Other Topics Concern   Not on file  Social History Narrative   Not on file   Social Drivers of Health   Financial Resource Strain: High Risk (08/18/2023)   Overall Financial Resource Strain (CARDIA)    Difficulty of Paying Living Expenses: Hard  Food Insecurity: No Food Insecurity (10/01/2023)   Hunger Vital Sign    Worried About Running Out of Food in the Last Year: Never true    Ran Out of Food in the Last Year: Never true  Transportation Needs: No Transportation Needs (08/18/2023)   PRAPARE - Administrator, Civil Service (Medical): No    Lack of Transportation  (Non-Medical): No  Physical Activity: Not on file  Stress: Not on file  Social Connections: Unknown (06/19/2021)   [facility]   Social Network    Social Network: Not on file  Intimate Partner Violence: Not At Risk (01/24/2022)   Humiliation, Afraid, Rape, and Kick questionnaire    Fear of Current or Ex-Partner: No    Emotionally Abused: No    Physically Abused: No    Sexually Abused: No         Objective:  BP (!) 150/94   Pulse 82   Temp 98.2 F (36.8 C) (Oral)  Ht 5' 9 (1.753 m)   Wt 242 lb 9.6 oz (110 kg)   SpO2 94%   BMI 35.83 kg/m    Physical Exam Constitutional:      General: He is not in acute distress.    Appearance: Normal appearance. He is obese.  HENT:     Mouth/Throat:     Mouth: Mucous membranes are moist.  Cardiovascular:     Rate and Rhythm: Normal rate.  Pulmonary:     Effort: No respiratory distress.     Breath sounds: No wheezing or rales.  Musculoskeletal:     Right lower leg: No edema.     Left lower leg: No edema.  Skin:    General: Skin is warm.  Neurological:     Mental Status: He is alert and oriented to person, place, and time.  Psychiatric:        Mood and Affect: Mood normal.     Diagnostic Review:    Pft     No data to display         CT cardiac scoring 09/2021 Peripheral scarring in the right lower lobe.     [facility] 12/2021- Dr Burnard Ned, MD Multivessel CAD with severely calcified proximal RCA stenosis of 90% followed by mid 20% stenosis in a dominant RCA which supplies the LV apex.   Diffusely calcified proximal LAD stenoses of 40% 75% and 40%.   Mild nonobstructive disease in the distal left main and left circumflex vessel.   LVEDP 21 mmHg. Difficult but successful PCI to the severely calcified proximal RCA with PTCA, scoring balloon, and intravascular lithotripsy and ultimate insertion of a 3.5 x 20 mm Synergy DES stent    If patient has recurrent symptomatology will need staged PCI to LAD     Assessment & Plan:   Assessment & Plan SOB (shortness of breath) Likely multifactorial.  Cannot rule out ASTHMA. Discussed the symptoms, etiology, pathophysiology, diagnostic test, treatment, flare ups,  prognosis of asthma Obesity with BMI 35 likely contributing to dyspnea. Advised patient to call cardiology office and set up a follow-up with them as well Patient does have a history of severe GERD, uncontrolled GERD could be responsible for uncontrolled asthma. Prescribed albuterol  for as needed use.  Will decide on maintenance inhaler based on following labs/results Uncontrolled hypertension with flash pulmonary edema is a possibility given his history of diastolic heart failure-LVEDP 21 in 2023.  Does not have overt signs of CHF currently Orders:   CBC with Differential/Platelet   IgE; Future   Pulmonary function test; Future   CT Chest Wo Contrast; Future  History of CAD (coronary artery disease) History of stents in the past.  Was supposed to follow-up with cardiology.  Plan was for staged PCI to LAD if he had recurrent symptomatology Advised patient to call cardiology and establish follow-up visit with them.    Obesity, unspecified class, unspecified obesity type, unspecified whether serious comorbidity present Encouraged pt to attempt weight loss with exercise and diet control    Gastroesophageal reflux disease, unspecified whether esophagitis present Patient was instructed not to lay on bed earlier than 2 hours after last meal, was advised to avoid late night meals/snacks and to avoid coffee and caffeine as much as possible. Agree with omeprazole  orally daily.     Allergic rhinitis, unspecified seasonality, unspecified trigger Continue flonase  Orders:   CBC with Differential/Platelet   IgE; Future   Pulmonary function test; Future   CT Chest Wo Contrast; Future  Scarring of  lung Prior cardiac workup has shown spiculated referral scarring on CT scans Will proceed  with CT chest for follow-up of this and to rule out early ILD      Thank you for the opportunity to take part in the care of Gene Reed   Return in about 3 weeks (around 11/25/2023).   Rane Blitch Pleas, MD [facility] Office: [phone]

## 2023-11-05 LAB — IGE: IgE (Immunoglobulin E), Serum: 1755 kU/L — ABNORMAL HIGH (ref <=114)

## 2023-11-07 ENCOUNTER — Other Ambulatory Visit: Payer: Self-pay

## 2023-11-07 ENCOUNTER — Ambulatory Visit
Admission: RE | Admit: 2023-11-07 | Discharge: 2023-11-07 | Disposition: A | Discharge status: Home / Self Care | Source: Ambulatory Visit

## 2023-11-07 DIAGNOSIS — R0602 Shortness of breath: Secondary | ICD-10-CM

## 2023-11-07 DIAGNOSIS — J309 Allergic rhinitis, unspecified: Secondary | ICD-10-CM

## 2023-11-09 ENCOUNTER — Ambulatory Visit: Payer: Self-pay

## 2023-11-09 DIAGNOSIS — J309 Allergic rhinitis, unspecified: Secondary | ICD-10-CM

## 2023-11-12 ENCOUNTER — Encounter (HOSPITAL_BASED_OUTPATIENT_CLINIC_OR_DEPARTMENT_OTHER)

## 2023-11-12 ENCOUNTER — Ambulatory Visit

## 2023-11-13 NOTE — Progress Notes (Signed)
 ATC x1 - unable to LVM. Sent MyChart message, NFN.

## 2023-11-13 NOTE — Progress Notes (Signed)
 Called and spoke to pt - advised of lab results per Dr. Pleas. Pt verbalized understanding and expressed that he would like a referral placed.  Placed allergy referral, NFN.

## 2023-12-10 ENCOUNTER — Other Ambulatory Visit: Payer: Self-pay

## 2023-12-10 ENCOUNTER — Encounter: Payer: Self-pay | Admitting: Allergy

## 2023-12-10 ENCOUNTER — Ambulatory Visit (INDEPENDENT_AMBULATORY_CARE_PROVIDER_SITE_OTHER): Admitting: Allergy

## 2023-12-10 VITALS — BP 138/84 | HR 85 | Temp 98.3°F | Resp 20 | Ht 69.0 in | Wt 277.7 lb

## 2023-12-10 DIAGNOSIS — H1013 Acute atopic conjunctivitis, bilateral: Secondary | ICD-10-CM

## 2023-12-10 DIAGNOSIS — H109 Unspecified conjunctivitis: Secondary | ICD-10-CM

## 2023-12-10 DIAGNOSIS — J310 Chronic rhinitis: Secondary | ICD-10-CM

## 2023-12-10 DIAGNOSIS — J4540 Moderate persistent asthma, uncomplicated: Secondary | ICD-10-CM | POA: Diagnosis not present

## 2023-12-10 MED ORDER — AZELASTINE HCL 0.1 % NA SOLN
2.0000 | Freq: Two times a day (BID) | NASAL | 5 refills | Status: AC
  Filled 2023-12-10: qty 30, 30d supply, fill #0

## 2023-12-10 MED ORDER — BUDESONIDE-FORMOTEROL FUMARATE 160-4.5 MCG/ACT IN AERO
2.0000 | INHALATION_SPRAY | Freq: Two times a day (BID) | RESPIRATORY_TRACT | 5 refills | Status: AC
  Filled 2023-12-10: qty 10.2, 30d supply, fill #0

## 2023-12-10 MED ORDER — LEVOCETIRIZINE DIHYDROCHLORIDE 5 MG PO TABS
5.0000 mg | ORAL_TABLET | Freq: Every evening | ORAL | 5 refills | Status: AC
  Filled 2023-12-10: qty 30, 30d supply, fill #0

## 2023-12-10 NOTE — Patient Instructions (Addendum)
 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 - Prescribed Symbicort 160mcg inhaler for maintenance therapy.  Use 2 puffs twice a day with spacer device (provided today).  Rinse mouth after use.   - Have access to albuterol  inhaler 2 puffs every 4-6 hours as needed for cough/wheeze/shortness of breath/chest tightness.  May use 15-20 minutes prior to activity.   Monitor frequency of use.   - Continue follow-up care with pulmonologist, Dr Pleas   Breathing control goals:  Full participation in all desired activities (may need albuterol  before activity) Albuterol  use two time or less a week on average (not counting use with activity) Cough interfering with sleep two time or less a month Oral steroids no more than once a year No hospitalizations   Allergic rhinoconjunctivitis - Prescribed Dymista nasal spray to replace Flonase .  Use Dymista 1 spray each nostril twice a day as needed for runny or stuffy nose.  With using nasal sprays point tip of bottle toward eye on same side nostril and lean head slightly forward for best technique.   - Prescribe Xyzal 5mg  daily as needed for allergy symptoms.  This is a long-acting antihistamine.   - Recommended saline nasal rinse with Navage   - Schedule skin testing for allergies and hold antihistamines (ie Xyzal) 3 days prior to testing day

## 2023-12-10 NOTE — Progress Notes (Unsigned)
 New Patient Note  RE: Gene Reed MRN: [id number] DOB: 1970-09-12 Date of Office Visit: 12/10/2023  Primary care provider: Oley Bascom RAMAN, NP  Chief Complaint: asthma  History of present illness: Gene Reed is a 53 y.o. male presenting today for evaluation of allergies and asthma.   Discussed the use of AI scribe software for clinical note transcription with the patient, who gave verbal consent to proceed.  History of Present Illness   Gene Reed is a 53 year old male with asthma who presents with throat and sinus symptoms.  He has been experiencing intermittent symptoms of throat and sinus closure, itchy throat and nose, and watery eyes for about eight months. The sensation of his throat 'closing up' makes it difficult to breathe, allowing exhalation but not inhalation. The itchy and watery eyes have persisted for approximately two years. These symptoms occur year-round and are not limited to any specific season.  He was diagnosed with asthma approximately two months ago and has been using an albuterol  inhaler as prescribed. He uses the inhaler every six hours, taking two puffs each time, and keeps one inhaler in his vehicle and another at home. Additionally, he uses a nasal spray, administering two sprays in his nose once a day.  He has not previously seen an allergist and has not undergone a full pulmonary function test. His IgE levels were noted to be elevated at around 1700, although specific allergens have not been identified. His eosinophil count was 200.  He has not taken regular allergy antihistamines like Claritin, Zyrtec, or Allegra, and does not report any food allergies or eczema. He has used saline rinses in the past and is familiar with the Navage system for nasal irrigation.       Review of systems: 10pt ROS negative unless noted above in HPI  Past medical history: Past Medical History:  Diagnosis Date   Adrenal nodule    Arthritis    Asthma     Bilateral chronic knee pain    Blood transfusion without reported diagnosis    Bone spur    CAD (coronary artery disease)    GERD (gastroesophageal reflux disease)    Gout    Hyperlipidemia    Hypertension    Sleep apnea    wears CPAP   Substance abuse (HCC)     Past surgical history: Past Surgical History:  Procedure Laterality Date   CORONARY LITHOTRIPSY N/A 12/06/2021   Procedure: CORONARY LITHOTRIPSY;  Surgeon: Burnard Debby LABOR, MD;  Location: [facility];  Service: Cardiovascular;  Laterality: N/A;   CORONARY STENT INTERVENTION Right 12/06/2021   Procedure: CORONARY STENT INTERVENTION;  Surgeon: Burnard Debby LABOR, MD;  Location: [facility];  Service: Cardiovascular;  Laterality: Right;   ESOPHAGOGASTRODUODENOSCOPY Left 01/23/2022   Procedure: ESOPHAGOGASTRODUODENOSCOPY (EGD);  Surgeon: San Sandor GAILS, DO;  Location: [facility] ENDOSCOPY;  Service: Gastroenterology;  Laterality: Left;   HOT HEMOSTASIS N/A 01/23/2022   Procedure: HOT HEMOSTASIS (ARGON PLASMA COAGULATION/BICAP);  Surgeon: [place] Sandor GAILS, DO;  Location: [facility] ENDOSCOPY;  Service: Gastroenterology;  Laterality: N/A;   KNEE SURGERY     LEFT HEART CATH AND CORONARY ANGIOGRAPHY N/A 12/06/2021   Procedure: LEFT HEART CATH AND CORONARY ANGIOGRAPHY;  Surgeon: Burnard Debby LABOR, MD;  Location: [facility];  Service: Cardiovascular;  Laterality: N/A;   UPPER GASTROINTESTINAL ENDOSCOPY      Family history:  Family History  Problem Relation Age of Onset   Colon cancer Neg Hx  Esophageal cancer Neg Hx    Rectal cancer Neg Hx    Stomach cancer Neg Hx     Social history: Lives in a home without carpeting with electric heating and central/window cooling.  Dogs in the home.  No concern for roaches in the home.  Concern for water damage/mildew in the home.  Does not report occupation at this time.  Tobacco Use   Smoking status: Former   Smokeless tobacco: Never  Vaping Use   Vaping status: Never Used   Substance and Sexual Activity   Alcohol use: Yes    Comment:  6 beers per a day    Drug use: Yes    Types: Marijuana    Comment: 1 joint a day, last used 12-16-22   Medication List: Current Outpatient Medications  Medication Sig Dispense Refill   albuterol  (VENTOLIN  HFA) 108 (90 Base) MCG/ACT inhaler Inhale 2 puffs into the lungs every 4 (four) hours as needed for wheezing or shortness of breath. 18 g 2   allopurinol  (ZYLOPRIM ) 100 MG tablet Take 1 tablet (100 mg total) by mouth daily. 90 tablet 3   amLODipine  (NORVASC ) 5 MG tablet Take 1 tablet (5 mg total) by mouth daily. 90 tablet 1   carvedilol  (COREG ) 25 MG tablet Take 1 tablet (25 mg total) by mouth 2 (two) times daily. 60 tablet 0   fluticasone  (FLONASE ) 50 MCG/ACT nasal spray Place 2 sprays into both nostrils daily. 16 g 6   nitroGLYCERIN  (NITROSTAT ) 0.4 MG SL tablet Place 1 tablet (0.4 mg total) under the tongue every 5 (five) minutes as needed for chest pain. 25 tablet 3   rosuvastatin  (CRESTOR ) 40 MG tablet Take 1 tablet (40 mg total) by mouth daily. 90 tablet 3   ticagrelor  (BRILINTA ) 90 MG TABS tablet Take 1 tablet (90 mg total) by mouth 2 (two) times daily. 60 tablet 11   omega-3 acid ethyl esters (LOVAZA ) 1 g capsule Take 1 capsule (1 g total) by mouth daily. (Patient not taking: Reported on 11/04/2023) 30 capsule 2   omeprazole  (PRILOSEC) 40 MG capsule Take 1 capsule (40 mg total) by mouth daily as needed (Patient taking differently: Take 40 mg by mouth daily as needed. Takes PRN) 90 capsule 3   Vitamin D , Ergocalciferol , (DRISDOL ) 1.25 MG (50000 UNIT) CAPS capsule Take 1 capsule (50,000 Units total) by mouth every 7 (seven) days. (Patient not taking: Reported on 12/10/2023) 5 capsule 2   No current facility-administered medications for this visit.    Known medication allergies: Allergies  Allergen Reactions   Poison Ivy Extract Itching    Poison oak     Physical examination: Blood pressure 138/84, pulse 85,  temperature 98.3 F (36.8 C), temperature source Temporal, resp. rate 20, height 5' 9 (1.753 m), weight 277 lb 11.2 oz (126 kg), SpO2 95%.  General: Alert, interactive, in no acute distress. HEENT: PERRLA, TMs pearly gray, turbinates moderately edematous without discharge, post-pharynx non erythematous. Neck: Supple without lymphadenopathy. Lungs: Clear to auscultation without wheezing, rhonchi or rales. {no increased work of breathing. CV: Normal S1, S2 without murmurs. Abdomen: Nondistended, nontender. Skin: Warm and dry, without lesions or rashes. Extremities:  No clubbing, cyanosis or edema. Neuro:   Grossly intact.  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 - Prescribed Symbicort 160mcg inhaler for  maintenance therapy.  Use 2 puffs twice a day with spacer device (provided today).  Rinse mouth after use.   - Have access to albuterol  inhaler 2 puffs every 4-6 hours as needed for cough/wheeze/shortness of breath/chest tightness.  May use 15-20 minutes prior to activity.   Monitor frequency of use.   - Continue follow-up care with pulmonologist, Dr Pleas   Breathing control goals:  Full participation in all desired activities (may need albuterol  before activity) Albuterol  use two time or less a week on average (not counting use with activity) Cough interfering with sleep two time or less a month Oral steroids no more than once a year No hospitalizations   Allergic rhinoconjunctivitis - Prescribed Dymista nasal spray to replace Flonase .  Use Dymista 1 spray each nostril twice a day as needed for runny or stuffy nose.  With using nasal sprays point tip of bottle toward eye on same side nostril and lean head slightly forward for best  technique.   - Prescribe Xyzal 5mg  daily as needed for allergy symptoms.  This is a long-acting antihistamine.   - Recommended saline nasal rinse with Navage   - Schedule skin testing for allergies and hold antihistamines (ie Xyzal) 3 days prior to testing day (Env 1-55)   I appreciate the opportunity to take part in Damontae's care. Please do not hesitate to contact me with questions.  Sincerely,   Danita Brain, MD Allergy/Immunology [facility]

## 2023-12-11 ENCOUNTER — Other Ambulatory Visit: Payer: Self-pay

## 2024-01-07 ENCOUNTER — Ambulatory Visit: Payer: Self-pay | Admitting: Nurse Practitioner

## 2024-01-23 ENCOUNTER — Ambulatory Visit: Payer: Self-pay | Admitting: Nurse Practitioner

## 2024-02-12 ENCOUNTER — Encounter: Payer: Self-pay | Admitting: Nurse Practitioner

## 2024-02-12 ENCOUNTER — Ambulatory Visit: Admitting: Nurse Practitioner

## 2024-02-12 ENCOUNTER — Other Ambulatory Visit: Payer: Self-pay

## 2024-02-12 VITALS — BP 136/77 | HR 80 | Temp 98.0°F | Wt 272.0 lb

## 2024-02-12 DIAGNOSIS — M25561 Pain in right knee: Secondary | ICD-10-CM | POA: Diagnosis not present

## 2024-02-12 DIAGNOSIS — L02511 Cutaneous abscess of right hand: Secondary | ICD-10-CM

## 2024-02-12 DIAGNOSIS — I10 Essential (primary) hypertension: Secondary | ICD-10-CM

## 2024-02-12 MED ORDER — CEPHALEXIN 500 MG PO CAPS
500.0000 mg | ORAL_CAPSULE | Freq: Three times a day (TID) | ORAL | 0 refills | Status: AC
  Filled 2024-02-12: qty 30, 10d supply, fill #0

## 2024-02-12 MED ORDER — SYSTANE 0.4-0.3 % OP GEL
1.0000 | Freq: Every day | OPHTHALMIC | 0 refills | Status: AC
  Filled 2024-02-12: qty 10, 1d supply, fill #0

## 2024-02-12 MED ORDER — PREDNISONE 20 MG PO TABS
20.0000 mg | ORAL_TABLET | Freq: Every day | ORAL | 0 refills | Status: AC
  Filled 2024-02-12: qty 5, 5d supply, fill #0

## 2024-02-12 NOTE — Progress Notes (Signed)
 "  Subjective   Patient ID: Gene Reed, male    DOB: 03-08-1970, 54 y.o.   MRN: [id number]  Chief Complaint  Patient presents with   Follow-up    Popped leg other day reaching across the bed. Has bruise on right lower leg. Sore every since popping.     Referring provider: Oley Bascom RAMAN, NP  [name] is a 55 y.o. male with Past Medical History: No date: Adrenal nodule No date: Arthritis No date: Asthma No date: Bilateral chronic knee pain No date: Blood transfusion without reported diagnosis No date: Bone spur No date: CAD (coronary artery disease) No date: GERD (gastroesophageal reflux disease) No date: Gout No date: Hyperlipidemia No date: Hypertension No date: Sleep apnea     Comment:  wears CPAP No date: Substance abuse [facility])   HPI  Patient presents today for follow-up on hypertension and also for an acute visit.  He states that a few days ago he felt like he pulled something in the back of his leg which did cause bruising.  This morning he had a popping sensation to his right knee on that same affected leg.  Will place a referral for him to orthopedics.  Patient also complains of infection to his right hand.  He does not know what caused this but he does have an area to his right fifth finger that has had purulent drainage.  We will trial Keflex . Denies f/c/s, n/v/d, hemoptysis, PND, leg swelling Denies chest pain or edema        Allergies[1]  Immunization History  Administered Date(s) Administered   PFIZER(Purple Top)SARS-COV-2 Vaccination 06/22/2019, 07/25/2019   Tdap 09/21/2015    Tobacco History: Tobacco Use History[2] Counseling given: Not Answered   Outpatient Encounter Medications as of 02/12/2024  Medication Sig   albuterol  (VENTOLIN  HFA) 108 (90 Base) MCG/ACT inhaler Inhale 2 puffs into the lungs every 4 (four) hours as needed for wheezing or shortness of breath.   allopurinol  (ZYLOPRIM ) 100 MG tablet Take 1 tablet (100 mg total) by  mouth daily.   amLODipine  (NORVASC ) 5 MG tablet Take 1 tablet (5 mg total) by mouth daily.   azelastine  (ASTELIN ) 0.1 % nasal spray Place 2 sprays into both nostrils 2 (two) times daily. Use in each nostril as directed   budesonide -formoterol  (SYMBICORT ) 160-4.5 MCG/ACT inhaler Inhale 2 puffs into the lungs 2 (two) times daily.   carvedilol  (COREG ) 25 MG tablet Take 1 tablet (25 mg total) by mouth 2 (two) times daily.   cephALEXin  (KEFLEX ) 500 MG capsule Take 1 capsule (500 mg total) by mouth 3 (three) times daily.   fluticasone  (FLONASE ) 50 MCG/ACT nasal spray Place 2 sprays into both nostrils daily.   levocetirizine (XYZAL ) 5 MG tablet Take 1 tablet (5 mg total) by mouth every evening.   nitroGLYCERIN  (NITROSTAT ) 0.4 MG SL tablet Place 1 tablet (0.4 mg total) under the tongue every 5 (five) minutes as needed for chest pain.   omega-3 acid ethyl esters (LOVAZA ) 1 g capsule Take 1 capsule (1 g total) by mouth daily.   omeprazole  (PRILOSEC) 40 MG capsule Take 1 capsule (40 mg total) by mouth daily as needed   Polyethyl Glycol-Propyl Glycol (SYSTANE) 0.4-0.3 % GEL ophthalmic gel Place 1 Application into the right eye daily.   [EXPIRED] predniSONE  (DELTASONE ) 20 MG tablet Take 1 tablet (20 mg total) by mouth daily with breakfast for 5 days.   rosuvastatin  (CRESTOR ) 40 MG tablet Take 1 tablet (40 mg total) by mouth daily.  ticagrelor  (BRILINTA ) 90 MG TABS tablet Take 1 tablet (90 mg total) by mouth 2 (two) times daily.   Vitamin D , Ergocalciferol , (DRISDOL ) 1.25 MG (50000 UNIT) CAPS capsule Take 1 capsule (50,000 Units total) by mouth every 7 (seven) days. (Patient not taking: Reported on 02/12/2024)   No facility-administered encounter medications on file as of 02/12/2024.    Review of Systems  Review of Systems  Constitutional: Negative.   HENT: Negative.    Cardiovascular: Negative.   Gastrointestinal: Negative.   Musculoskeletal:        Right leg and knee pain  Allergic/Immunologic:  Negative.   Neurological: Negative.   Psychiatric/Behavioral: Negative.       Objective:   BP 136/77   Pulse 80   Temp 98 F (36.7 C) (Temporal)   Wt 272 lb (123.4 kg)   SpO2 95%   BMI 40.17 kg/m   Wt Readings from Last 5 Encounters:  02/12/24 272 lb (123.4 kg)  12/10/23 277 lb 11.2 oz (126 kg)  11/04/23 242 lb 9.6 oz (110 kg)  10/01/23 266 lb 12.8 oz (121 kg)  08/18/23 268 lb (121.6 kg)     Physical Exam Vitals and nursing note reviewed.  Constitutional:      General: He is not in acute distress.    Appearance: He is well-developed.  Cardiovascular:     Rate and Rhythm: Normal rate and regular rhythm.  Pulmonary:     Effort: Pulmonary effort is normal.     Breath sounds: Normal breath sounds.  Musculoskeletal:        General: Tenderness present. No swelling.     Comments: Right knee tenderness nted  Skin:    General: Skin is warm and dry.  Neurological:     Mental Status: He is alert and oriented to person, place, and time.       Assessment & Plan:   Acute pain of right knee -     Ambulatory referral to Orthopedic Surgery  Cutaneous abscess of right hand -     Cephalexin ; Take 1 capsule (500 mg total) by mouth 3 (three) times daily.  Dispense: 30 capsule; Refill: 0  Primary hypertension -     CBC -     Comprehensive metabolic panel with GFR -     Lipid panel  Other orders -     predniSONE ; Take 1 tablet (20 mg total) by mouth daily with breakfast for 5 days.  Dispense: 5 tablet; Refill: 0 -     Systane; Place 1 Application into the right eye daily.  Dispense: 10 mL; Refill: 0     Return in about 3 months (around 05/12/2024).   Bascom GORMAN Borer, NP 02/23/2024     [1]  Allergies Allergen Reactions   Poison Ivy Extract Itching    Poison oak  [2]  Social History Tobacco Use  Smoking Status Former  Smokeless Tobacco Never   "

## 2024-02-13 LAB — COMPREHENSIVE METABOLIC PANEL WITH GFR
ALT: 31 IU/L (ref 0–44)
AST: 36 IU/L (ref 0–40)
Albumin: 4.4 g/dL (ref 3.8–4.9)
Alkaline Phosphatase: 110 IU/L (ref 47–123)
BUN/Creatinine Ratio: 17 (ref 9–20)
BUN: 12 mg/dL (ref 6–24)
Bilirubin Total: 1 mg/dL (ref 0.0–1.2)
CO2: 23 mmol/L (ref 20–29)
Calcium: 9.4 mg/dL (ref 8.7–10.2)
Chloride: 102 mmol/L (ref 96–106)
Creatinine, Ser: 0.7 mg/dL — ABNORMAL LOW (ref 0.76–1.27)
Globulin, Total: 3.3 g/dL (ref 1.5–4.5)
Glucose: 87 mg/dL (ref 70–99)
Potassium: 3.7 mmol/L (ref 3.5–5.2)
Sodium: 140 mmol/L (ref 134–144)
Total Protein: 7.7 g/dL (ref 6.0–8.5)
eGFR: 111 mL/min/1.73

## 2024-02-13 LAB — CBC
Hematocrit: 40.2 % (ref 37.5–51.0)
Hemoglobin: 13.9 g/dL (ref 13.0–17.7)
MCH: 31.4 pg (ref 26.6–33.0)
MCHC: 34.6 g/dL (ref 31.5–35.7)
MCV: 91 fL (ref 79–97)
Platelets: 266 x10E3/uL (ref 150–450)
RBC: 4.42 x10E6/uL (ref 4.14–5.80)
RDW: 12.6 % (ref 11.6–15.4)
WBC: 11.5 x10E3/uL — ABNORMAL HIGH (ref 3.4–10.8)

## 2024-02-13 LAB — LIPID PANEL
Chol/HDL Ratio: 4.2 ratio (ref 0.0–5.0)
Cholesterol, Total: 177 mg/dL (ref 100–199)
HDL: 42 mg/dL
LDL Chol Calc (NIH): 96 mg/dL (ref 0–99)
Triglycerides: 232 mg/dL — ABNORMAL HIGH (ref 0–149)
VLDL Cholesterol Cal: 39 mg/dL (ref 5–40)

## 2024-02-16 ENCOUNTER — Other Ambulatory Visit: Payer: Self-pay

## 2024-02-16 ENCOUNTER — Ambulatory Visit: Payer: Self-pay | Admitting: Nurse Practitioner

## 2024-02-16 MED ORDER — PREDNISONE 10 MG (21) PO TBPK
ORAL_TABLET | ORAL | 0 refills | Status: AC
  Filled 2024-02-16: qty 21, 6d supply, fill #0

## 2024-02-23 ENCOUNTER — Encounter: Payer: Self-pay | Admitting: Nurse Practitioner

## 2024-05-12 ENCOUNTER — Ambulatory Visit: Payer: Self-pay | Admitting: Nurse Practitioner
